# Patient Record
Sex: Male | Born: 1935 | Race: White | Hispanic: No | Smoking: Former smoker
Health system: Southern US, Community
[De-identification: ages and names within clinical notes are randomized; demographics above are authoritative.]

## PROBLEM LIST (undated history)

## (undated) DIAGNOSIS — I77810 Thoracic aortic ectasia: Secondary | ICD-10-CM

## (undated) DIAGNOSIS — I350 Nonrheumatic aortic (valve) stenosis: Secondary | ICD-10-CM

## (undated) DIAGNOSIS — B029 Zoster without complications: Secondary | ICD-10-CM

## (undated) DIAGNOSIS — E785 Hyperlipidemia, unspecified: Secondary | ICD-10-CM

## (undated) DIAGNOSIS — E119 Type 2 diabetes mellitus without complications: Secondary | ICD-10-CM

## (undated) DIAGNOSIS — G459 Transient cerebral ischemic attack, unspecified: Secondary | ICD-10-CM

## (undated) DIAGNOSIS — N419 Inflammatory disease of prostate, unspecified: Secondary | ICD-10-CM

## (undated) DIAGNOSIS — K219 Gastro-esophageal reflux disease without esophagitis: Secondary | ICD-10-CM

## (undated) DIAGNOSIS — I2510 Atherosclerotic heart disease of native coronary artery without angina pectoris: Secondary | ICD-10-CM

## (undated) DIAGNOSIS — I10 Essential (primary) hypertension: Secondary | ICD-10-CM

## (undated) DIAGNOSIS — M1990 Unspecified osteoarthritis, unspecified site: Secondary | ICD-10-CM

## (undated) DIAGNOSIS — I351 Nonrheumatic aortic (valve) insufficiency: Secondary | ICD-10-CM

## (undated) HISTORY — PX: EYE SURGERY: SHX253

## (undated) HISTORY — DX: Nonrheumatic aortic (valve) stenosis: I35.0

## (undated) HISTORY — DX: Thoracic aortic ectasia: I77.810

## (undated) HISTORY — DX: Essential (primary) hypertension: I10

## (undated) HISTORY — DX: Nonrheumatic aortic (valve) insufficiency: I35.1

## (undated) HISTORY — DX: Zoster without complications: B02.9

## (undated) HISTORY — DX: Hyperlipidemia, unspecified: E78.5

## (undated) HISTORY — PX: CATARACT EXTRACTION W/ INTRAOCULAR LENS  IMPLANT, BILATERAL: SHX1307

## (undated) HISTORY — DX: Transient cerebral ischemic attack, unspecified: G45.9

## (undated) HISTORY — DX: Inflammatory disease of prostate, unspecified: N41.9

---
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
## 1999-04-06 ENCOUNTER — Other Ambulatory Visit: Admission: RE | Admit: 1999-04-06 | Discharge: 1999-04-06 | Payer: Self-pay | Admitting: *Deleted

## 1999-04-06 ENCOUNTER — Encounter (INDEPENDENT_AMBULATORY_CARE_PROVIDER_SITE_OTHER): Payer: Self-pay | Admitting: Specialist

## 2006-10-04 ENCOUNTER — Encounter (INDEPENDENT_AMBULATORY_CARE_PROVIDER_SITE_OTHER): Payer: Self-pay | Admitting: Neurology

## 2006-10-04 ENCOUNTER — Encounter (INDEPENDENT_AMBULATORY_CARE_PROVIDER_SITE_OTHER): Payer: Self-pay | Admitting: *Deleted

## 2006-10-04 ENCOUNTER — Inpatient Hospital Stay (HOSPITAL_COMMUNITY): Admission: EM | Admit: 2006-10-04 | Discharge: 2006-10-07 | Payer: Self-pay | Admitting: Emergency Medicine

## 2009-12-10 ENCOUNTER — Emergency Department (HOSPITAL_COMMUNITY): Admission: EM | Admit: 2009-12-10 | Discharge: 2009-12-10 | Payer: Self-pay | Admitting: Emergency Medicine

## 2010-08-28 LAB — DIFFERENTIAL
Basophils Absolute: 0 10*3/uL (ref 0.0–0.1)
Eosinophils Absolute: 0 10*3/uL (ref 0.0–0.7)
Eosinophils Relative: 0 % (ref 0–5)
Lymphocytes Relative: 3 % — ABNORMAL LOW (ref 12–46)
Lymphs Abs: 0.2 10*3/uL — ABNORMAL LOW (ref 0.7–4.0)
Monocytes Absolute: 0.3 10*3/uL (ref 0.1–1.0)
Monocytes Relative: 5 % (ref 3–12)
Neutro Abs: 5.8 10*3/uL (ref 1.7–7.7)
Neutrophils Relative %: 93 % — ABNORMAL HIGH (ref 43–77)

## 2010-08-28 LAB — URINALYSIS, ROUTINE W REFLEX MICROSCOPIC: Nitrite: NEGATIVE

## 2010-08-28 LAB — CBC
Hemoglobin: 13.1 g/dL (ref 13.0–17.0)
MCH: 31.7 pg (ref 26.0–34.0)
Platelets: 127 10*3/uL — ABNORMAL LOW (ref 150–400)
RDW: 14 % (ref 11.5–15.5)
WBC: 6.2 10*3/uL (ref 4.0–10.5)

## 2010-08-28 LAB — BASIC METABOLIC PANEL
Calcium: 8.5 mg/dL (ref 8.4–10.5)
Chloride: 97 mEq/L (ref 96–112)
GFR calc Af Amer: 53 mL/min — ABNORMAL LOW (ref >60)
GFR calc non Af Amer: 44 mL/min — ABNORMAL LOW (ref >60)

## 2010-08-28 LAB — GLUCOSE, CAPILLARY: Glucose-Capillary: 126 mg/dL — ABNORMAL HIGH (ref 70–99)

## 2010-08-28 LAB — URINE MICROSCOPIC-ADD ON

## 2010-10-28 NOTE — Discharge Summary (Signed)
[name], Seth Abbott                ACCOUNT NO.:  [id number]   MEDICAL RECORD NO.:  [id number]          PATIENT TYPE:  INP   LOCATION:  3039                         FACILITY:  [facility]   PHYSICIAN:  Bevelyn Buckles. [place], M.D.DATE OF BIRTH:  Oct 24, 1927   DATE OF ADMISSION:  10/04/2006  DATE OF DISCHARGE:  10/07/2006                               DISCHARGE SUMMARY   REASON FOR ADMISSION:  TIA and hypoglycemia.   HOSPITAL COURSE:  Please see admission H&P by Dr. Orlin Hilding for complete  details.  The patient was admitted to stroke M.D. service for transient  right-sided weakness that improved.  He had a drug workup including  carotid Doppler's which showed no ICA stenosis.  A 2D echocardiogram  which showed aortic valve stenosis with an EF of 65% to 70%.  The  patient had recurrence of symptoms on October 05, 2006, in the a.m. with  right-sided weakness.  The patient was started on heparin until workup  was complete.  The patient's initial MRI of the brain on October 04, 2006,  showed no evidence of acute intracranial findings.  The MRI was  essentially unremarkable.  The patient had a repeat limited MRI once had  recurrence of symptoms that did not show any acute infarct.  Throughout  the patient's hospitalization, he had fluctuation in his sugars and his  sugar insulin regimen was adjusted.  The patient was switched from his  baby aspirin to Aggrenox for optimal stroke prevention.  The patient did  have the rest of the stroke workup completed including hemoglobin A1c of  6.3, and other laboratory cardiac workup, which was essentially normal.  The patient had no further events and was stable at the time of  discharge.   DISPOSITION:  The patient was discharged home with his family in stable  condition.   DISCHARGE MEDICATIONS:  Include:  1. Lipitor 20 mg per day.  2. Altace 5 mg p.o. daily.  3. Azopt eye drops twice a day.  4. NovoLog insulin 15 units q.a.m. and 13 units q.p.m.  5. NPH insulin  27 units q.a.m. and 24 units q.p.m.   DISCHARGE INSTRUCTIONS:  The patient was instructed to followup with PCP  within 1 to 2 weeks time and followup with Dr. Pearlean Brownie of neurology in one  months time.  The office number was given.  The patient was also told to  continue to follow his insulin closely.  He was told to call with any  questions or concerns or return to the hospital with any recurrence of  symptoms.  The patient understands and agrees he was stable on  discharge.      Bevelyn Buckles. Nash Shearer, M.D.  Electronically Signed     DRC/MEDQ  D:  10/07/2006  T:  10/07/2006  Job:  [id number]

## 2010-10-28 NOTE — H&P (Signed)
Seth Abbott, Seth Abbott                ACCOUNT NO.:  [id number]   MEDICAL RECORD NO.:  [id number]          PATIENT TYPE:  INP   LOCATION:  1843                         FACILITY:  [facility]   PHYSICIAN:  Gustavus Messing. Orlin Hilding, M.D.DATE OF BIRTH:  [birth year]   DATE OF ADMISSION:  10/04/2006  DATE OF DISCHARGE:                              HISTORY & PHYSICAL   PRIMARY CARE PHYSICIAN:  Dr. Dagoberto Ligas.   Time neurology was called:  8:22.  Time of neurology initial exam:  8:35.   CHIEF COMPLAINT:  Right-sided weakness and numbness.   HISTORY OF PRESENT ILLNESS:  Mr. Dunton is a 77 year old right-handed  white man with a history of diabetes who awoke to go to the bathroom  around 3 O'clock in the morning.  He was fine at that time, went back to  bed.  He awoke again just before 7 a.m. and while walking to the  bathroom, noted that he was weak on the right and staggery.  He fell  back on the bed, did not hit his head on anything.  His speech was  slurred, but not gibberish according to his wife.   REVIEW OF SYSTEMS:  Negative for any headache or vision problems.  No  chest pain.  No shortness of breath.  No GI or GU complaints.  No  musculoskeletal complaints.  No bruising or other hematologic  complaints.  No depression or other psychiatric complaints.   PAST MEDICAL HISTORY:  Significant for bilateral cataract surgery,  diabetes, question of borderline hypertension, hyperlipidemia.  He is on  Altace and Lipitor, that may just simply be preventative or prophylactic  given his diabetes.  He does have diabetes which is insulin-dependent,  although type 2.  He had a TIA versus stroke about 15 years ago which  was also left brain, right body.   MEDICATIONS:  1. Insulin, he takes Humulin-N 27 units twice a day and Humalog 15      twice a day.  2. Altace 5 mg daily.  3. Lipitor 20 mg daily.  4. Aspirin 81 mg a day.   ALLERGIES:  NO KNOWN DRUG ALLERGIES.   SOCIAL HISTORY:  He is married.  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  He has 2 grown children.  He had one child that was lost to [facility] at birth.   FAMILY HISTORY:  Positive for coronary artery disease in his father, who  died of a heart attack.  No stroke.   OBJECTIVE ON EXAM:  VITAL SIGNS:  Temperature is 96.5, blood pressure  143/76, heart rate 80, respirations 16, 99% sat on room air.  HEAD:  Normocephalic, atraumatic.  NECK:  Supple without bruits.  LUNGS:  Clear to auscultation.  HEART:  Regular rate and rhythm.  ABDOMEN:  Benign.  SKIN:  Dry.  Mucosa is moist.  NEUROLOGIC EXAM:  He is awake and alert.  He is oriented except that he  does not have the correct date.  He said it was November.  He eventually  corrected himself, but he stuck with November quite a bit.  He has mild  hesitance with his speech, but not enough to really qualify for an  aphasia.  Cranial nerves, pupils are equal and reactive.  He does have  iridectomies.  Visual fields are full.  The confrontation extraocular  movements are intact.  Facial sensation is normal.  Facial motor  activity is normal voluntarily.  When he is at rest and speaking, it  appears that he might have a left facial droop, but when he smiles,  puffs out his cheeks, raises eyebrows, etc, there is no facial  asymmetry.  Hearing is slightly diminished.  Tongue is midline.  On  motor exam, there is no drift in either the upper or lower extremities.  He has 5/5 strength.  Normal bulk and tone.  Normal rapid fine  movements.  No vesiculations, atrophy, or tremor.  Deep tendon reflexes  are minimal 1+ in the upper extremities, absent in the lower  extremities.  Down-going toes to plantar stimulation.  Coordination:  Finger-to-nose is normal bilaterally.  Heel-to-shin was normal on the  left.  He has mild dysmetria of heel-to-shin on the right.  Sensation  decreased slightly on the right.  Nutritional status is adequate.  His  NIH stroke scale was 3, see accompanying sheet.  Modified  Rankin scale  is 0.   LABORATORY DATA:  Labs were unremarkable.  His glucose is 92.  INR is 1.  Platelets are 192,000.  Sodium is 135.  CT is yet pending.   ASSESSMENT:  Left brain event.  Small infarct versus resolving TIA.   PLAN:  Admit to workup for stroke or TIA with MRI of the brain, MR  angiograms, carotid Dopplers, 2D echo, ST OT PT evaluation.  Will  increase his aspirin to 325 and consider Aggrenox.  He is not a TPA  candidate secondary to time elapsed.  He is not a study candidate  secondary to minimal deficit, which is improving.      Catherine A. Orlin Hilding, M.D.  Electronically Signed     CAW/MEDQ  D:  10/04/2006  T:  10/04/2006  Job:  [id number]   cc:   Alfonse Alpers. Dagoberto Ligas, M.D.

## 2011-07-26 DIAGNOSIS — E119 Type 2 diabetes mellitus without complications: Secondary | ICD-10-CM | POA: Diagnosis not present

## 2011-07-26 DIAGNOSIS — E785 Hyperlipidemia, unspecified: Secondary | ICD-10-CM | POA: Diagnosis not present

## 2011-07-26 DIAGNOSIS — I10 Essential (primary) hypertension: Secondary | ICD-10-CM | POA: Diagnosis not present

## 2011-07-26 DIAGNOSIS — E669 Obesity, unspecified: Secondary | ICD-10-CM | POA: Diagnosis not present

## 2011-07-28 DIAGNOSIS — E785 Hyperlipidemia, unspecified: Secondary | ICD-10-CM | POA: Diagnosis not present

## 2011-07-28 DIAGNOSIS — I10 Essential (primary) hypertension: Secondary | ICD-10-CM | POA: Diagnosis not present

## 2011-07-28 DIAGNOSIS — E119 Type 2 diabetes mellitus without complications: Secondary | ICD-10-CM | POA: Diagnosis not present

## 2011-08-07 DIAGNOSIS — H4011X0 Primary open-angle glaucoma, stage unspecified: Secondary | ICD-10-CM | POA: Diagnosis not present

## 2011-08-07 DIAGNOSIS — H2640 Unspecified secondary cataract: Secondary | ICD-10-CM | POA: Diagnosis not present

## 2011-08-07 DIAGNOSIS — H409 Unspecified glaucoma: Secondary | ICD-10-CM | POA: Diagnosis not present

## 2011-08-09 DIAGNOSIS — E119 Type 2 diabetes mellitus without complications: Secondary | ICD-10-CM | POA: Diagnosis not present

## 2011-08-09 DIAGNOSIS — R011 Cardiac murmur, unspecified: Secondary | ICD-10-CM | POA: Diagnosis not present

## 2011-08-09 DIAGNOSIS — I10 Essential (primary) hypertension: Secondary | ICD-10-CM | POA: Diagnosis not present

## 2011-08-09 DIAGNOSIS — E785 Hyperlipidemia, unspecified: Secondary | ICD-10-CM | POA: Diagnosis not present

## 2011-08-09 DIAGNOSIS — E669 Obesity, unspecified: Secondary | ICD-10-CM | POA: Diagnosis not present

## 2011-08-24 DIAGNOSIS — H2640 Unspecified secondary cataract: Secondary | ICD-10-CM | POA: Diagnosis not present

## 2011-08-24 DIAGNOSIS — H26499 Other secondary cataract, unspecified eye: Secondary | ICD-10-CM | POA: Diagnosis not present

## 2012-01-08 DIAGNOSIS — Z961 Presence of intraocular lens: Secondary | ICD-10-CM | POA: Diagnosis not present

## 2012-01-08 DIAGNOSIS — H35379 Puckering of macula, unspecified eye: Secondary | ICD-10-CM | POA: Diagnosis not present

## 2012-01-08 DIAGNOSIS — H4011X0 Primary open-angle glaucoma, stage unspecified: Secondary | ICD-10-CM | POA: Diagnosis not present

## 2012-01-08 DIAGNOSIS — H409 Unspecified glaucoma: Secondary | ICD-10-CM | POA: Diagnosis not present

## 2012-01-24 DIAGNOSIS — I10 Essential (primary) hypertension: Secondary | ICD-10-CM | POA: Diagnosis not present

## 2012-01-24 DIAGNOSIS — E669 Obesity, unspecified: Secondary | ICD-10-CM | POA: Diagnosis not present

## 2012-02-05 DIAGNOSIS — R011 Cardiac murmur, unspecified: Secondary | ICD-10-CM | POA: Diagnosis not present

## 2012-02-07 DIAGNOSIS — I10 Essential (primary) hypertension: Secondary | ICD-10-CM | POA: Diagnosis not present

## 2012-02-07 DIAGNOSIS — E119 Type 2 diabetes mellitus without complications: Secondary | ICD-10-CM | POA: Diagnosis not present

## 2012-02-07 DIAGNOSIS — E669 Obesity, unspecified: Secondary | ICD-10-CM | POA: Diagnosis not present

## 2012-02-07 DIAGNOSIS — R011 Cardiac murmur, unspecified: Secondary | ICD-10-CM | POA: Diagnosis not present

## 2012-02-07 DIAGNOSIS — E785 Hyperlipidemia, unspecified: Secondary | ICD-10-CM | POA: Diagnosis not present

## 2012-02-28 DIAGNOSIS — Z23 Encounter for immunization: Secondary | ICD-10-CM | POA: Diagnosis not present

## 2012-05-06 DIAGNOSIS — H409 Unspecified glaucoma: Secondary | ICD-10-CM | POA: Diagnosis not present

## 2012-05-06 DIAGNOSIS — H4011X0 Primary open-angle glaucoma, stage unspecified: Secondary | ICD-10-CM | POA: Diagnosis not present

## 2012-05-06 DIAGNOSIS — H52209 Unspecified astigmatism, unspecified eye: Secondary | ICD-10-CM | POA: Diagnosis not present

## 2012-08-12 DIAGNOSIS — E119 Type 2 diabetes mellitus without complications: Secondary | ICD-10-CM | POA: Diagnosis not present

## 2012-08-12 DIAGNOSIS — E785 Hyperlipidemia, unspecified: Secondary | ICD-10-CM | POA: Diagnosis not present

## 2012-08-19 DIAGNOSIS — E119 Type 2 diabetes mellitus without complications: Secondary | ICD-10-CM | POA: Diagnosis not present

## 2012-08-19 DIAGNOSIS — I10 Essential (primary) hypertension: Secondary | ICD-10-CM | POA: Diagnosis not present

## 2012-08-19 DIAGNOSIS — E785 Hyperlipidemia, unspecified: Secondary | ICD-10-CM | POA: Diagnosis not present

## 2012-08-19 DIAGNOSIS — E669 Obesity, unspecified: Secondary | ICD-10-CM | POA: Diagnosis not present

## 2012-11-20 DIAGNOSIS — H4011X0 Primary open-angle glaucoma, stage unspecified: Secondary | ICD-10-CM | POA: Diagnosis not present

## 2012-11-20 DIAGNOSIS — H409 Unspecified glaucoma: Secondary | ICD-10-CM | POA: Diagnosis not present

## 2012-11-20 DIAGNOSIS — H35379 Puckering of macula, unspecified eye: Secondary | ICD-10-CM | POA: Diagnosis not present

## 2013-02-07 DIAGNOSIS — R011 Cardiac murmur, unspecified: Secondary | ICD-10-CM | POA: Diagnosis not present

## 2013-02-19 DIAGNOSIS — E785 Hyperlipidemia, unspecified: Secondary | ICD-10-CM | POA: Diagnosis not present

## 2013-02-19 DIAGNOSIS — E119 Type 2 diabetes mellitus without complications: Secondary | ICD-10-CM | POA: Diagnosis not present

## 2013-02-19 DIAGNOSIS — I10 Essential (primary) hypertension: Secondary | ICD-10-CM | POA: Diagnosis not present

## 2013-02-19 DIAGNOSIS — E663 Overweight: Secondary | ICD-10-CM | POA: Diagnosis not present

## 2013-03-01 DIAGNOSIS — Z23 Encounter for immunization: Secondary | ICD-10-CM | POA: Diagnosis not present

## 2013-03-26 DIAGNOSIS — Z1331 Encounter for screening for depression: Secondary | ICD-10-CM | POA: Diagnosis not present

## 2013-03-26 DIAGNOSIS — I359 Nonrheumatic aortic valve disorder, unspecified: Secondary | ICD-10-CM | POA: Diagnosis not present

## 2013-03-26 DIAGNOSIS — I10 Essential (primary) hypertension: Secondary | ICD-10-CM | POA: Diagnosis not present

## 2013-03-26 DIAGNOSIS — E119 Type 2 diabetes mellitus without complications: Secondary | ICD-10-CM | POA: Diagnosis not present

## 2013-05-21 DIAGNOSIS — Z961 Presence of intraocular lens: Secondary | ICD-10-CM | POA: Diagnosis not present

## 2013-05-21 DIAGNOSIS — H4011X0 Primary open-angle glaucoma, stage unspecified: Secondary | ICD-10-CM | POA: Diagnosis not present

## 2013-05-21 DIAGNOSIS — H35379 Puckering of macula, unspecified eye: Secondary | ICD-10-CM | POA: Diagnosis not present

## 2013-05-21 DIAGNOSIS — H409 Unspecified glaucoma: Secondary | ICD-10-CM | POA: Diagnosis not present

## 2013-07-02 DIAGNOSIS — R0602 Shortness of breath: Secondary | ICD-10-CM | POA: Diagnosis not present

## 2013-07-12 ENCOUNTER — Encounter: Payer: Self-pay | Admitting: *Deleted

## 2013-07-12 DIAGNOSIS — Z794 Long term (current) use of insulin: Secondary | ICD-10-CM

## 2013-07-12 DIAGNOSIS — I10 Essential (primary) hypertension: Secondary | ICD-10-CM

## 2013-07-12 DIAGNOSIS — E1129 Type 2 diabetes mellitus with other diabetic kidney complication: Secondary | ICD-10-CM | POA: Insufficient documentation

## 2013-07-12 DIAGNOSIS — B029 Zoster without complications: Secondary | ICD-10-CM | POA: Insufficient documentation

## 2013-07-12 DIAGNOSIS — I152 Hypertension secondary to endocrine disorders: Secondary | ICD-10-CM | POA: Insufficient documentation

## 2013-07-12 DIAGNOSIS — N419 Inflammatory disease of prostate, unspecified: Secondary | ICD-10-CM | POA: Insufficient documentation

## 2013-07-12 DIAGNOSIS — I749 Embolism and thrombosis of unspecified artery: Secondary | ICD-10-CM

## 2013-07-12 DIAGNOSIS — R809 Proteinuria, unspecified: Secondary | ICD-10-CM

## 2013-07-12 DIAGNOSIS — G459 Transient cerebral ischemic attack, unspecified: Secondary | ICD-10-CM | POA: Insufficient documentation

## 2013-07-12 DIAGNOSIS — E1159 Type 2 diabetes mellitus with other circulatory complications: Secondary | ICD-10-CM | POA: Insufficient documentation

## 2013-07-22 DIAGNOSIS — E663 Overweight: Secondary | ICD-10-CM | POA: Diagnosis not present

## 2013-07-22 DIAGNOSIS — E785 Hyperlipidemia, unspecified: Secondary | ICD-10-CM | POA: Diagnosis not present

## 2013-07-22 DIAGNOSIS — I10 Essential (primary) hypertension: Secondary | ICD-10-CM | POA: Diagnosis not present

## 2013-07-22 DIAGNOSIS — E119 Type 2 diabetes mellitus without complications: Secondary | ICD-10-CM | POA: Diagnosis not present

## 2013-07-22 LAB — HEMOGLOBIN A1C: Hemoglobin A1C: 7.1

## 2013-09-24 DIAGNOSIS — G4700 Insomnia, unspecified: Secondary | ICD-10-CM | POA: Diagnosis not present

## 2013-09-24 DIAGNOSIS — R079 Chest pain, unspecified: Secondary | ICD-10-CM | POA: Diagnosis not present

## 2013-09-24 DIAGNOSIS — F329 Major depressive disorder, single episode, unspecified: Secondary | ICD-10-CM | POA: Diagnosis not present

## 2013-09-24 DIAGNOSIS — E119 Type 2 diabetes mellitus without complications: Secondary | ICD-10-CM | POA: Diagnosis not present

## 2013-09-24 DIAGNOSIS — I10 Essential (primary) hypertension: Secondary | ICD-10-CM | POA: Diagnosis not present

## 2013-09-24 DIAGNOSIS — F3289 Other specified depressive episodes: Secondary | ICD-10-CM | POA: Diagnosis not present

## 2013-10-01 ENCOUNTER — Encounter: Payer: Self-pay | Admitting: Cardiology

## 2013-10-01 ENCOUNTER — Ambulatory Visit (INDEPENDENT_AMBULATORY_CARE_PROVIDER_SITE_OTHER): Payer: Medicare Other | Admitting: Cardiology

## 2013-10-01 VITALS — BP 136/71 | HR 84 | Ht 67.0 in | Wt 171.8 lb

## 2013-10-01 DIAGNOSIS — I10 Essential (primary) hypertension: Secondary | ICD-10-CM

## 2013-10-01 DIAGNOSIS — I359 Nonrheumatic aortic valve disorder, unspecified: Secondary | ICD-10-CM | POA: Diagnosis not present

## 2013-10-01 DIAGNOSIS — I77810 Thoracic aortic ectasia: Secondary | ICD-10-CM | POA: Diagnosis not present

## 2013-10-01 DIAGNOSIS — I351 Nonrheumatic aortic (valve) insufficiency: Secondary | ICD-10-CM

## 2013-10-01 DIAGNOSIS — R079 Chest pain, unspecified: Secondary | ICD-10-CM | POA: Insufficient documentation

## 2013-10-01 NOTE — Progress Notes (Signed)
[address], [place], [place]  [address] Phone: [phone] Fax:  [phone]  Date:  10/01/2013   ID:  Seth Abbott, DOB 02-17-1928, MRN [id number]  PCP:  No primary provider on file.  Cardiologist:  Fransico Him, MD     History of Present Illness: Seth Abbott is a 80 y.o. male with history of type 2 DM, TIA, aortic root dilatation and HTN who presents today for evaluation of intermittent CP that is left sided and sharp and sometimes associated with SOB but no nausea.  It is nonradiating.  He says that about 2-3 weeks ago he had one spell of pain that started in his right low back and radiating into his right chest and across to the left.  He had been riding a tractor the day before.  He has not had that any more.  He says that he has a low grade pain in the left side of his chest that comes and goes and is a 4/10 at its worst.  He says that it is sharp and occurs at some point almost daily. He has some DOE as well.  It can come on with exertion or nonexertional activity.  He does a lot of work outside where he uses his upper body such as a chain saw.  He denies any LE edema, palpitations, dizziness or syncope.   Wt Readings from Last 3 Encounters:  10/01/13 171 lb 12.8 oz (77.928 kg)     Past Medical History  Diagnosis Date  . Diabetes   . TIA (transient ischemic attack)   . HTN (hypertension)   . Prostatitis   . Shingles   . Aortic regurgitation     trivial by echo 01/2013  . Hyperlipidemia   . Dilated aortic root     Current Outpatient Prescriptions  Medication Sig Dispense Refill  . aspirin 81 MG tablet Take 81 mg by mouth daily.      [name] atorvastatin (LIPITOR) 20 MG tablet Take 20 mg by mouth daily.      . dorzolamide (TRUSOPT) 2 % ophthalmic solution 1 drop 3 (three) times daily.      . insulin lispro (HUMALOG) 100 UNIT/ML cartridge Inject into the skin as directed.      . NON FORMULARY Humalin      . ramipril (ALTACE) 5 MG capsule Take 5 mg by mouth  daily.       No current facility-administered medications for this visit.    Allergies:   No Known Allergies  Social History:  The patient  reports that he has never smoked. He does not have any smokeless tobacco history on file. He reports that he does not drink alcohol or use illicit drugs.   Family History:  The patient's family history includes CAD in his father; COPD in his brother; Diabetes in his father, mother, sister, and sister; Heart attack in his father.   ROS:  Please see the history of present illness.      All other systems reviewed and negative.   PHYSICAL EXAM: VS:  BP 136/71  Pulse 84  Ht 5\' 7"  (1.702 m)  Wt 171 lb 12.8 oz (77.928 kg)  BMI 26.90 kg/m2 Well nourished, well developed, in no acute distress HEENT: normal Neck: no JVD Cardiac:  normal S1, S2; RRR; 2/6 SM at RUSB to LLSB Lungs:  clear to auscultation bilaterally, no wheezing, rhonchi or rales Abd: soft, nontender, no hepatomegaly Ext: no edema Skin: warm and dry Neuro:  CNs 2-12 intact, no focal abnormalities noted  EKG:     09/23/2013 NSR with occasional PVC's and low voltage and normal intervals  ASSESSMENT AND PLAN:  1. Chest pain with typical and atypical components in a patient with CRF including family history of CAD, HTN, DM - Stress myoview to rule out ischemia 2. Mild aortic root dilatation getting yearly echoes to follow 3. Heart murmur mild AS/AR on echo 2013 - 2D echo to assess for progression of AS  Followup with me in 1 year is studies are normal  Signed, Fransico Him, MD 10/01/2013 9:46 AM

## 2013-10-01 NOTE — Patient Instructions (Signed)
Your physician recommends that you continue on your current medications as directed. Please refer to the Current Medication list given to you today.  Your physician has requested that you have an echocardiogram. Echocardiography is a painless test that uses sound waves to create images of your heart. It provides your doctor with information about the size and shape of your heart and how well your heart's chambers and valves are working. This procedure takes approximately one hour. There are no restrictions for this procedure.  Your physician has requested that you have an exercise stress myoview. For further information please visit [email or web address]. Please follow instruction sheet, as given.  Your physician wants you to follow-up in: 12 months You will receive a reminder letter in the mail two months in advance. If you don't receive a letter, please call our office to schedule the follow-up appointment.

## 2013-10-12 ENCOUNTER — Emergency Department (HOSPITAL_COMMUNITY): Payer: Medicare Other

## 2013-10-12 ENCOUNTER — Encounter (HOSPITAL_COMMUNITY): Payer: Self-pay | Admitting: Emergency Medicine

## 2013-10-12 ENCOUNTER — Emergency Department (HOSPITAL_COMMUNITY)
Admission: EM | Admit: 2013-10-12 | Discharge: 2013-10-13 | Disposition: A | Discharge status: Home / Self Care | Payer: Medicare Other | Attending: Emergency Medicine | Admitting: Emergency Medicine

## 2013-10-12 DIAGNOSIS — N200 Calculus of kidney: Secondary | ICD-10-CM | POA: Insufficient documentation

## 2013-10-12 DIAGNOSIS — I10 Essential (primary) hypertension: Secondary | ICD-10-CM | POA: Insufficient documentation

## 2013-10-12 DIAGNOSIS — R109 Unspecified abdominal pain: Secondary | ICD-10-CM | POA: Diagnosis not present

## 2013-10-12 DIAGNOSIS — J984 Other disorders of lung: Secondary | ICD-10-CM | POA: Diagnosis not present

## 2013-10-12 DIAGNOSIS — K3189 Other diseases of stomach and duodenum: Secondary | ICD-10-CM | POA: Diagnosis not present

## 2013-10-12 DIAGNOSIS — Z79899 Other long term (current) drug therapy: Secondary | ICD-10-CM | POA: Diagnosis not present

## 2013-10-12 DIAGNOSIS — Z8673 Personal history of transient ischemic attack (TIA), and cerebral infarction without residual deficits: Secondary | ICD-10-CM | POA: Diagnosis not present

## 2013-10-12 DIAGNOSIS — Z7982 Long term (current) use of aspirin: Secondary | ICD-10-CM | POA: Diagnosis not present

## 2013-10-12 DIAGNOSIS — R1013 Epigastric pain: Secondary | ICD-10-CM

## 2013-10-12 DIAGNOSIS — E119 Type 2 diabetes mellitus without complications: Secondary | ICD-10-CM | POA: Diagnosis not present

## 2013-10-12 DIAGNOSIS — Z8619 Personal history of other infectious and parasitic diseases: Secondary | ICD-10-CM | POA: Diagnosis not present

## 2013-10-12 DIAGNOSIS — N201 Calculus of ureter: Secondary | ICD-10-CM | POA: Diagnosis not present

## 2013-10-12 DIAGNOSIS — R011 Cardiac murmur, unspecified: Secondary | ICD-10-CM | POA: Insufficient documentation

## 2013-10-12 DIAGNOSIS — Z87448 Personal history of other diseases of urinary system: Secondary | ICD-10-CM | POA: Diagnosis not present

## 2013-10-12 DIAGNOSIS — E785 Hyperlipidemia, unspecified: Secondary | ICD-10-CM | POA: Diagnosis not present

## 2013-10-12 DIAGNOSIS — R0602 Shortness of breath: Secondary | ICD-10-CM | POA: Diagnosis not present

## 2013-10-12 DIAGNOSIS — Z794 Long term (current) use of insulin: Secondary | ICD-10-CM | POA: Insufficient documentation

## 2013-10-12 LAB — URINALYSIS, ROUTINE W REFLEX MICROSCOPIC
BILIRUBIN URINE: NEGATIVE
GLUCOSE, UA: NEGATIVE mg/dL
Ketones, ur: NEGATIVE mg/dL
Leukocytes, UA: NEGATIVE
Nitrite: NEGATIVE
PROTEIN: NEGATIVE mg/dL
SPECIFIC GRAVITY, URINE: 1.015 (ref 1.005–1.030)
UROBILINOGEN UA: 1 mg/dL (ref 0.0–1.0)
pH: 6.5 (ref 5.0–8.0)

## 2013-10-12 LAB — COMPREHENSIVE METABOLIC PANEL
ALBUMIN: 4 g/dL (ref 3.5–5.2)
ALT: 16 U/L (ref 0–53)
AST: 19 U/L (ref 0–37)
Alkaline Phosphatase: 92 U/L (ref 39–117)
BILIRUBIN TOTAL: 0.6 mg/dL (ref 0.3–1.2)
BUN: 14 mg/dL (ref 6–23)
CALCIUM: 9.5 mg/dL (ref 8.4–10.5)
CO2: 25 mEq/L (ref 19–32)
CREATININE: 1 mg/dL (ref 0.50–1.35)
Chloride: 101 mEq/L (ref 96–112)
GFR calc non Af Amer: 67 mL/min — ABNORMAL LOW (ref >90)
GFR, EST AFRICAN AMERICAN: 78 mL/min — AB (ref >90)
GLUCOSE: 125 mg/dL — AB (ref 70–99)
POTASSIUM: 4.4 meq/L (ref 3.7–5.3)
Sodium: 140 mEq/L (ref 137–147)
Total Protein: 7 g/dL (ref 6.0–8.3)

## 2013-10-12 LAB — CBC WITH DIFFERENTIAL/PLATELET
BASOS ABS: 0 10*3/uL (ref 0.0–0.1)
Basophils Relative: 0 % (ref 0–1)
EOS ABS: 0.1 10*3/uL (ref 0.0–0.7)
Eosinophils Relative: 2 % (ref 0–5)
HEMATOCRIT: 43.9 % (ref 39.0–52.0)
Hemoglobin: 15.1 g/dL (ref 13.0–17.0)
LYMPHS PCT: 12 % (ref 12–46)
Lymphs Abs: 0.9 10*3/uL (ref 0.7–4.0)
MCH: 31.8 pg (ref 26.0–34.0)
MCHC: 34.4 g/dL (ref 30.0–36.0)
MCV: 92.4 fL (ref 78.0–100.0)
MONO ABS: 0.9 10*3/uL (ref 0.1–1.0)
MONOS PCT: 12 % (ref 3–12)
NEUTROS ABS: 5.4 10*3/uL (ref 1.7–7.7)
Neutrophils Relative %: 74 % (ref 43–77)
PLATELETS: 189 10*3/uL (ref 150–400)
RBC: 4.75 MIL/uL (ref 4.22–5.81)
RDW: 13.1 % (ref 11.5–15.5)
WBC: 7.3 10*3/uL (ref 4.0–10.5)

## 2013-10-12 LAB — URINE MICROSCOPIC-ADD ON

## 2013-10-12 LAB — TROPONIN I

## 2013-10-12 MED ORDER — MORPHINE SULFATE 4 MG/ML IJ SOLN
4.0000 mg | Freq: Once | INTRAMUSCULAR | Status: AC
  Administered 2013-10-12: 4 mg via INTRAVENOUS
  Filled 2013-10-12: qty 1

## 2013-10-12 MED ORDER — ONDANSETRON HCL 4 MG/2ML IJ SOLN
4.0000 mg | Freq: Once | INTRAMUSCULAR | Status: AC
Start: 2013-10-12 — End: 2013-10-12
  Administered 2013-10-12: 4 mg via INTRAVENOUS
  Filled 2013-10-12: qty 2

## 2013-10-12 MED ORDER — IOHEXOL 350 MG/ML SOLN
100.0000 mL | Freq: Once | INTRAVENOUS | Status: AC | PRN
  Administered 2013-10-12: 100 mL via INTRAVENOUS

## 2013-10-12 NOTE — ED Notes (Signed)
EKG given to Dr. Ples Specter

## 2013-10-12 NOTE — ED Notes (Signed)
Updated patient and family that patient will have X-ray done at the bedside, and has CT angio ordered also.

## 2013-10-12 NOTE — ED Notes (Signed)
Portable  Xray at the bedside.

## 2013-10-12 NOTE — ED Notes (Signed)
Pt transported to CT ?

## 2013-10-12 NOTE — ED Notes (Addendum)
C/o R abd pain. Also R flank pain. abd pain onset yesterday. Back pain onset today. (denies: CP, nvd, sob, fever, bleeding, urinary sx or other sx), constant since 1500, no aggravating or aleviating factors, has tried [place] with some relief. H/o DM and cardiac issues. recent card appt on 4/22 for overdue visit. Took tagamet this am. cbg at home tonight was 114.

## 2013-10-12 NOTE — ED Provider Notes (Signed)
CSN: [id number]     Arrival date & time 10/12/13  1908 History   First MD Initiated Contact with Patient 10/12/13 2136     Chief Complaint  Patient presents with  . Abdominal Pain  . Back Pain     (Consider location/radiation/quality/duration/timing/severity/associated sxs/prior Treatment) HPI  Seth Abbott is a 80 y.o. male complaining of intermittent abdominal pain onset 3 days ago associated with severe left low back pain onset today. Back pain was maximal at onset now it is about 6/10. Patient denies chest pain, syncope, shortness of breath, decreased by mouth intake, change in bowel or bladder habits, cough fever. Patient has never been a smoker, follows with cardiologist [name], has stress test ordered for next week.   Past Medical History  Diagnosis Date  . Diabetes   . TIA (transient ischemic attack)   . HTN (hypertension)   . Prostatitis   . Shingles   . Aortic regurgitation     trivial by echo 01/2013  . Hyperlipidemia   . Dilated aortic root    Past Surgical History  Procedure Laterality Date  . Cataract extraction     Family History  Problem Relation Age of Onset  . Diabetes Mother   . Heart attack Father   . Diabetes Father   . CAD Father   . Diabetes Sister   . COPD Brother   . Diabetes Sister    History  Substance Use Topics  . Smoking status: Never Smoker   . Smokeless tobacco: Not on file  . Alcohol Use: No    Review of Systems  10 systems reviewed and found to be negative, except as noted in the HPI.  Allergies  Review of patient's allergies indicates no known allergies.  Home Medications   Prior to Admission medications   Medication Sig Start Date End Date Taking? Authorizing Provider  aspirin 81 MG tablet Take 81 mg by mouth daily.    Historical Provider, MD  atorvastatin (LIPITOR) 20 MG tablet Take 20 mg by mouth daily.    Historical Provider, MD  dorzolamide (TRUSOPT) 2 % ophthalmic solution 1 drop 3 (three) times daily.     Historical Provider, MD  insulin lispro (HUMALOG) 100 UNIT/ML cartridge Inject into the skin as directed.    Historical Provider, MD  NON FORMULARY Humalin    Historical Provider, MD  ramipril (ALTACE) 5 MG capsule Take 5 mg by mouth daily.    Historical Provider, MD   BP 149/70  Pulse 73  Temp(Src) 97.6 F (36.4 C) (Oral)  Resp 18  Ht 5\' 7"  (1.702 m)  Wt 171 lb (77.565 kg)  BMI 26.78 kg/m2  SpO2 99% Physical Exam  Nursing note and vitals reviewed. Constitutional: He is oriented to person, place, and time. He appears well-developed and well-nourished. No distress.  Appears uncomfortable.  HENT:  Head: Normocephalic.  Mouth/Throat: Oropharynx is clear and moist.  Eyes: Conjunctivae and EOM are normal. Pupils are equal, round, and reactive to light.  Cardiovascular: Normal rate, regular rhythm and intact distal pulses.   Murmur heard. Systolic blowing murmur, 2/5  Pulmonary/Chest: Effort normal and breath sounds normal. No stridor. No respiratory distress. He has no wheezes. He has no rales. He exhibits no tenderness.  Abdominal: Soft. Bowel sounds are normal. He exhibits no distension and no mass. There is no tenderness. There is no rebound and no guarding.  No tenderness to palpation in any quadrant  Musculoskeletal: Normal range of motion.  No tenderness to percussion of  lumbar spinal processes, and no paraspinal spasm or tenderness to palpation.  Strength is 5 out of 5 in the bilateral lower extremities.  Neurological: He is alert and oriented to person, place, and time.  Psychiatric: He has a normal mood and affect.    ED Course  Procedures (including critical care time) Labs Review Labs Reviewed  COMPREHENSIVE METABOLIC PANEL - Abnormal; Notable for the following:    Glucose, Bld 125 (*)    GFR calc non Af Amer 67 (*)    GFR calc Af Amer 78 (*)    All other components within normal limits  URINALYSIS, ROUTINE W REFLEX MICROSCOPIC - Abnormal; Notable for the following:     Hgb urine dipstick LARGE (*)    All other components within normal limits  URINE MICROSCOPIC-ADD ON - Abnormal; Notable for the following:    Bacteria, UA FEW (*)    All other components within normal limits  CBC WITH DIFFERENTIAL  TROPONIN I    Imaging Review Dg Chest Port 1 View  10/12/2013   CLINICAL DATA:  Left-sided low back pain, shortness of breath  EXAM: PORTABLE CHEST - 1 VIEW  COMPARISON:  None.  FINDINGS: The heart size and mediastinal contours are within normal limits. Both lungs are clear. The visualized skeletal structures are unremarkable.  IMPRESSION: No active disease.   Electronically Signed   By: Kathreen Devoid   On: 10/12/2013 22:37   Ct Angio Chest Aorta W/cm &/or Wo/cm  10/12/2013   CLINICAL DATA:  Right abdominal pain, back pain.  EXAM: CT ANGIOGRAPHY CHEST  CT angiography ABDOMEN AND PELVIS  TECHNIQUE: Multidetector CT angiography imaging of the chest was performed using the standard protocol during bolus administration of intravenous contrast. Multiplanar CT image reconstructions and MIPs were obtained to evaluate the vascular anatomy. Multidetector CT angiography imaging of the abdomen and pelvis was performed using the standard protocol during bolus administration of intravenous contrast.Multiplanar CT image reconstructions and MIPs were obtained to evaluate the vascular anatomy.  CONTRAST:  113mL OMNIPAQUE IOHEXOL 350 MG/ML SOLN  COMPARISON:  None.  FINDINGS: CTA CHEST FINDINGS  Three vessel aortic arch with mild calcific atherosclerosis, main branch vessels are widely patent. Thoracic aorta is normal in course and caliber with very mild calcific atherosclerosis. No dissection, aneurysm, suspicious intimal irregularity, periaortic fluid collections or contrast extravasation.  Heart and pericardium are unremarkable. Mild coronary artery calcifications.  Mild biapical pleural scarring. No pleural effusions, focal consolidations. 2 mm right upper lobe granuloma with scarring.  Tracheobronchial tree is patent and midline.  Thoracic esophagus is unremarkable. No lymphadenopathy by CT size criteria. Soft tissues are nonsuspicious. Bridging ventral syndesmotic fights. Mild degenerative change of thoracic spine.  CTA ABDOMEN and PELVIS FINDINGS  Aortoiliac vessels are normal in course and caliber. Mild calcific atherosclerosis. Ostia of the superior mesenteric artery, celiac trunk in inferior mesenteric artery are widely patent. Patent ostia of the renal arteries.No dissection, aneurysm, suspicious intimal irregularity, periaortic fluid collections or contrast extravasation.  The liver, pancreas, adrenal glands and gallbladder are unremarkable. Mosaic profusion of the spleen likely reflective of bolus timing.  Mild gastric fundal wall thickening may reflect underdistention, stomach is otherwise unremarkable. Small and large bowel are normal in course and caliber without inflammatory changes though sensitivity may be decreased by lack of enteric contrast. Mild-to-moderate amount of retained large bowel stool. A few colonic diverticula are noted. Normal air-filled appendix. No intraperitoneal free fluid nor free air.  Mild left hydroureteronephrosis to the level of the mid  ureter where a 6 mm calculus is seen. No residual nephrolithiasis. Slight delayed nephrogram on the left. The kidneys are otherwise unremarkable. Urinary bladder is well distended, harboring no intravesicular calculi. Prostate is 5 cm in transaxial dimension, mildly invading the bladder trigone.  Small fat containing umbilical hernia. Degenerative change of lumbar spine resulting in moderate canal stenosis at L3-4, mild-to-moderate L4-5. Scattered Schmorl's nodes. Moderate L2-3 through L5-S1 neural foraminal narrowing.  Review of the MIP images confirms the above findings.  IMPRESSION: CTA Chest: No acute vascular injury. No acute cardiopulmonary process.  CTA abdomen and pelvis:  No acute vascular injury.  Mild left  hydroureteronephrosis to the mid ureter where a 6 mm ureteral calculus is seen. Mildly delayed left nephrogram suggests dysfunction. No residual nephrolithiasis.   Electronically Signed   By: Elon Alas   On: 10/12/2013 23:54   Ct Angio Abd/pel W/ And/or W/o  10/12/2013   CLINICAL DATA:  Right abdominal pain, back pain.  EXAM: CT ANGIOGRAPHY CHEST  CT angiography ABDOMEN AND PELVIS  TECHNIQUE: Multidetector CT angiography imaging of the chest was performed using the standard protocol during bolus administration of intravenous contrast. Multiplanar CT image reconstructions and MIPs were obtained to evaluate the vascular anatomy. Multidetector CT angiography imaging of the abdomen and pelvis was performed using the standard protocol during bolus administration of intravenous contrast.Multiplanar CT image reconstructions and MIPs were obtained to evaluate the vascular anatomy.  CONTRAST:  115mL OMNIPAQUE IOHEXOL 350 MG/ML SOLN  COMPARISON:  None.  FINDINGS: CTA CHEST FINDINGS  Three vessel aortic arch with mild calcific atherosclerosis, main branch vessels are widely patent. Thoracic aorta is normal in course and caliber with very mild calcific atherosclerosis. No dissection, aneurysm, suspicious intimal irregularity, periaortic fluid collections or contrast extravasation.  Heart and pericardium are unremarkable. Mild coronary artery calcifications.  Mild biapical pleural scarring. No pleural effusions, focal consolidations. 2 mm right upper lobe granuloma with scarring. Tracheobronchial tree is patent and midline.  Thoracic esophagus is unremarkable. No lymphadenopathy by CT size criteria. Soft tissues are nonsuspicious. Bridging ventral syndesmotic fights. Mild degenerative change of thoracic spine.  CTA ABDOMEN and PELVIS FINDINGS  Aortoiliac vessels are normal in course and caliber. Mild calcific atherosclerosis. Ostia of the superior mesenteric artery, celiac trunk in inferior mesenteric artery are widely  patent. Patent ostia of the renal arteries.No dissection, aneurysm, suspicious intimal irregularity, periaortic fluid collections or contrast extravasation.  The liver, pancreas, adrenal glands and gallbladder are unremarkable. Mosaic profusion of the spleen likely reflective of bolus timing.  Mild gastric fundal wall thickening may reflect underdistention, stomach is otherwise unremarkable. Small and large bowel are normal in course and caliber without inflammatory changes though sensitivity may be decreased by lack of enteric contrast. Mild-to-moderate amount of retained large bowel stool. A few colonic diverticula are noted. Normal air-filled appendix. No intraperitoneal free fluid nor free air.  Mild left hydroureteronephrosis to the level of the mid ureter where a 6 mm calculus is seen. No residual nephrolithiasis. Slight delayed nephrogram on the left. The kidneys are otherwise unremarkable. Urinary bladder is well distended, harboring no intravesicular calculi. Prostate is 5 cm in transaxial dimension, mildly invading the bladder trigone.  Small fat containing umbilical hernia. Degenerative change of lumbar spine resulting in moderate canal stenosis at L3-4, mild-to-moderate L4-5. Scattered Schmorl's nodes. Moderate L2-3 through L5-S1 neural foraminal narrowing.  Review of the MIP images confirms the above findings.  IMPRESSION: CTA Chest: No acute vascular injury. No acute cardiopulmonary process.  CTA abdomen and  pelvis:  No acute vascular injury.  Mild left hydroureteronephrosis to the mid ureter where a 6 mm ureteral calculus is seen. Mildly delayed left nephrogram suggests dysfunction. No residual nephrolithiasis.   Electronically Signed   By: Elon Alas   On: 10/12/2013 23:54     EKG Interpretation   Date/Time:  Sunday Oct 12 2013 22:35:22 EDT Ventricular Rate:  77 PR Interval:  136 QRS Duration: 86 QT Interval:  381 QTC Calculation: 431 R Axis:   -49 Text Interpretation:  Sinus  rhythm Baseline wander Artifact When compared  with ECG of 10/04/2006 No significant change was found Confirmed by  [facility]  MD, KATHLEEN [id number]) on 10/12/2013 11:37:48 PM      MDM   Final diagnoses:  Kidney stone on left side    Filed Vitals:   10/12/13 2245 10/12/13 2300 10/12/13 2301 10/13/13 0015  BP: 150/71 161/75 161/75 149/70  Pulse: 75 79 75 73  Temp:      TempSrc:      Resp: 20 20 18 18   Height:      Weight:      SpO2: 98% 97% 98% 99%    Medications  morphine 4 MG/ML injection 4 mg (not administered)  morphine 4 MG/ML injection 4 mg (4 mg Intravenous Given 10/12/13 2314)  ondansetron (ZOFRAN) injection 4 mg (4 mg Intravenous Given 10/12/13 2314)  iohexol (OMNIPAQUE) 350 MG/ML injection 100 mL (100 mLs Intravenous Contrast Given 10/12/13 2324)    Seth Abbott is a 80 y.o. male presenting with dyspepsia and severe left low back pain. Concern for AAA and dissection. EKG is nonischemic, troponin is negative, blood work unremarkable and UA with a large amount of hemoglobin. No signs of infection. CTA chest and abdomen reveal a normal aorta and a 6 mm mid left ureteral stone. Patient has no prior history of kidney stones. Discussed return precautions with patient and his sons. Advised close followup with urology.  Evaluation does not show pathology that would require ongoing emergent intervention or inpatient treatment. Pt is hemodynamically stable and mentating appropriately. Discussed findings and plan with patient/guardian, who agrees with care plan. All questions answered. Return precautions discussed and outpatient follow up given.   New Prescriptions   OXYCODONE-ACETAMINOPHEN (PERCOCET/ROXICET) 5-325 MG PER TABLET    1 to 2 tabs PO q6hrs  PRN for pain   TAMSULOSIN (FLOMAX) 0.4 MG CAPS CAPSULE    Take 1 capsule (0.4 mg total) by mouth daily after breakfast.    Note: Portions of this report may have been transcribed using voice recognition software. Every effort was made to  ensure accuracy; however, inadvertent computerized transcription errors may be present     Monico Blitz, PA-C 10/13/13 0034

## 2013-10-12 NOTE — ED Notes (Signed)
Reported patient's pain to [place], [place]

## 2013-10-13 MED ORDER — TAMSULOSIN HCL 0.4 MG PO CAPS: 0.4000 mg | ORAL_CAPSULE | Freq: Every day | ORAL | Status: DC

## 2013-10-13 MED ORDER — MORPHINE SULFATE 4 MG/ML IJ SOLN
4.0000 mg | Freq: Once | INTRAMUSCULAR | Status: AC
  Administered 2013-10-13: 4 mg via INTRAVENOUS
  Filled 2013-10-13: qty 1

## 2013-10-13 MED ORDER — OXYCODONE-ACETAMINOPHEN 5-325 MG PO TABS: ORAL_TABLET | ORAL | Status: DC

## 2013-10-13 NOTE — Discharge Instructions (Signed)
Push fluids: take small frequent sips of water or Gatorade, do not drink any soda, juice or caffeinated beverages.  Take percocet for breakthrough pain, do not drink alcohol, drive, care for children or do other critical tasks while taking percocet.  Strain all urine and retain any stone for analysis at your urologist.   Return to the ED if you develop fever, have vomiting that is not controlled with the medication or if pain becomes too severe.  Kidney Stones Kidney stones (urolithiasis) are deposits that form inside your kidneys. The intense pain is caused by the stone moving through the urinary tract. When the stone moves, the ureter goes into spasm around the stone. The stone is usually passed in the urine.  CAUSES   A disorder that makes certain neck glands produce too much parathyroid hormone (primary hyperparathyroidism).  A buildup of uric acid crystals, similar to gout in your joints.  Narrowing (stricture) of the ureter.  A kidney obstruction present at birth (congenital obstruction).  Previous surgery on the kidney or ureters.  Numerous kidney infections. SYMPTOMS   Feeling sick to your stomach (nauseous).  Throwing up (vomiting).  Blood in the urine (hematuria).  Pain that usually spreads (radiates) to the groin.  Frequency or urgency of urination. DIAGNOSIS   Taking a history and physical exam.  Blood or urine tests.  CT scan.  Occasionally, an examination of the inside of the urinary bladder (cystoscopy) is performed. TREATMENT   Observation.  Increasing your fluid intake.  Extracorporeal shock wave lithotripsy This is a noninvasive procedure that uses shock waves to break up kidney stones.  Surgery may be needed if you have severe pain or persistent obstruction. There are various surgical procedures. Most of the procedures are performed with the use of small instruments. Only small incisions are needed to accommodate these instruments, so recovery  time is minimized. The size, location, and chemical composition are all important variables that will determine the proper choice of action for you. Talk to your health care provider to better understand your situation so that you will minimize the risk of injury to yourself and your kidney.  HOME CARE INSTRUCTIONS   Drink enough water and fluids to keep your urine clear or pale yellow. This will help you to pass the stone or stone fragments.  Strain all urine through the provided strainer. Keep all particulate matter and stones for your health care provider to see. The stone causing the pain may be as small as a grain of salt. It is very important to use the strainer each and every time you pass your urine. The collection of your stone will allow your health care provider to analyze it and verify that a stone has actually passed. The stone analysis will often identify what you can do to reduce the incidence of recurrences.  Only take over-the-counter or prescription medicines for pain, discomfort, or fever as directed by your health care provider.  Make a follow-up appointment with your health care provider as directed.  Get follow-up X-rays if required. The absence of pain does not always mean that the stone has passed. It may have only stopped moving. If the urine remains completely obstructed, it can cause loss of kidney function or even complete destruction of the kidney. It is your responsibility to make sure X-rays and follow-ups are completed. Ultrasounds of the kidney can show blockages and the status of the kidney. Ultrasounds are not associated with any radiation and can be performed easily in a  matter of minutes. SEEK MEDICAL CARE IF:  You experience pain that is progressive and unresponsive to any pain medicine you have been prescribed. SEEK IMMEDIATE MEDICAL CARE IF:   Pain cannot be controlled with the prescribed medicine.  You have a fever or shaking chills.  The severity or  intensity of pain increases over 18 hours and is not relieved by pain medicine.  You develop a new onset of abdominal pain.  You feel faint or pass out.  You are unable to urinate. MAKE SURE YOU:   Understand these instructions.  Will watch your condition.  Will get help right away if you are not doing well or get worse. Document Released: 05/29/2005 Document Revised: 01/29/2013 Document Reviewed: 10/30/2012 [facility] Patient Information 2014 [place].

## 2013-10-13 NOTE — ED Notes (Signed)
Strainer for patient on discharge.

## 2013-10-15 NOTE — ED Provider Notes (Signed)
Medical screening examination/treatment/procedure(s) were performed by non-physician practitioner and as supervising physician I was immediately available for consultation/collaboration.   EKG Interpretation   Date/Time:  Sunday Oct 12 2013 22:35:22 EDT Ventricular Rate:  77 PR Interval:  136 QRS Duration: 86 QT Interval:  381 QTC Calculation: 431 R Axis:   -49 Text Interpretation:  Sinus rhythm Baseline wander Artifact When compared  with ECG of 10/04/2006 No significant change was found Confirmed by  [facility]  MD, Shailee Foots ([id number]) on 10/12/2013 11:37:48 PM        Alfonzo Feller, DO 10/15/13 0710

## 2013-10-20 ENCOUNTER — Encounter (HOSPITAL_COMMUNITY): Payer: Medicare Other

## 2013-10-20 ENCOUNTER — Other Ambulatory Visit (HOSPITAL_COMMUNITY): Payer: Medicare Other

## 2013-10-27 DIAGNOSIS — N201 Calculus of ureter: Secondary | ICD-10-CM | POA: Diagnosis not present

## 2013-11-11 ENCOUNTER — Other Ambulatory Visit: Payer: Self-pay | Admitting: Urology

## 2013-11-11 DIAGNOSIS — N201 Calculus of ureter: Secondary | ICD-10-CM | POA: Diagnosis not present

## 2013-11-12 ENCOUNTER — Encounter (HOSPITAL_COMMUNITY): Payer: Self-pay | Admitting: Pharmacy Technician

## 2013-11-13 NOTE — Progress Notes (Signed)
Spoke to patient via phone,history obtained,updated.  Bring blue folder,insurance cards,picture ID,designated driver and living will,POA, if desires (to be placed on chart). Reinforced no aspirin(instructions to hold aspirin per your doctor),(do not take Aspirin beginning   Friday morning until after ESWL ) ibuprofen products 72 hours prior to procedure. No vitamins or herbal medicines 7 days prior to procedure.   Follow laxative instructions provided by urologist (office) and in blue folder. Pt states he is diabetic and checks his blood sugar each evening for his insulin dose. Pt was told he might want to take half the dose of his evening insulin  but patient was hesitant to do this. He was encouraged to check his CBG as usual in the evening before the ESWL and eat light but he could eat up until 12 MN and he is  NOT to take AM dose of insulin due to be NPO past MN. He can take his Lipitor and Altace with a sip of water AM of procedure Pt verbalized understanding  Wear easy on/off clothing  Pt able to answer all questions about health history and medications and wrote all information down during the phone interview . Pt states he does not have the blue folder but expects it to be mailed to son's home. I offered to call son's phone number to give him any information but patient states he will relay information of time of arrival ( Monday 11/17/13 at 0800 to [facility] entrance . Turn left down glass hallway and turn left at the end of the hall and look for [place]) for procedure to son and would call Short Stay at [phone] if they have any questions.  Pt verbalized understanding of all information

## 2013-11-16 NOTE — H&P (Signed)
Reason For Visit f/u for distal ureteral stone   History of Present Illness 78M presents in follow-up for left distal ureteral stone. He was placed on medical expulsion therapy. He initially developed symptoms in early May and was seen in the ED May 15th.    Interval: The patient has stopped taking Flomax because of arthralgia shoulder and neck pain. Those symptoms resolved. He denies any flank pain or changes in his voiding pattern. He denies any gross hematuria or fevers. He does not think these passes stone   Past Medical History Problems  1. History of transient cerebral ischemia (V12.54) 2. History of type 2 diabetes mellitus (V12.29)  Surgical History Problems  1. History of No Surgical Problems  Current Meds 1. Aspirin 81 MG Oral Tablet;  Therapy: (Recorded:18May2015) to Recorded 2. Dorzolamide HCl - 2 % Ophthalmic Solution;  Therapy: (Recorded:18May2015) to Recorded 3. HumaLOG 100 UNIT/ML Subcutaneous Solution;  Therapy: (Recorded:18May2015) to Recorded 4. HumuLIN N 100 UNIT/ML Subcutaneous Suspension;  Therapy: (Recorded:18May2015) to Recorded 5. Lipitor 20 MG Oral Tablet;  Therapy: (Recorded:18May2015) to Recorded 6. OxyCODONE HCl - 5 MG Oral Capsule; TAKE 1 CAPSULE EVERY 4  TO 6 HOURS AS  NEEDED FOR BREAKTHROUGH PAIN;  Therapy: 95MWU1324 to (Evaluate:28May2015); Last Rx:18May2015 Ordered 7. Ramipril 5 MG Oral Capsule;  Therapy: (Recorded:18May2015) to Recorded 8. Tamsulosin HCl - 0.4 MG Oral Capsule; TAKE 1 CAPSULE Daily;  Therapy: 40NUU7253 to (Evaluate:17Jul2015); Last Rx:18May2015 Ordered  Family History Problems  1. Family history of diabetes mellitus (V18.0) : Mother 2. Family history of myocardial infarction (V17.3) : Father  Social History Problems  1. Denied: History of Alcohol use 2. Caffeine use (V49.89) 3. Never a smoker 4. Widower  Review of Systems No changes in pts bowel habits, neurological changes, or progressive lower urinary tract  symptoms.    Vitals Vital Signs [Data Includes: Last 1 Day]  Recorded: 02Jun2015 09:40AM  Height: 5 ft 7 in Weight: 171 lb  BMI Calculated: 26.78 BSA Calculated: 1.89 Blood Pressure: 107 / 47 Temperature: 97.1 F Heart Rate: 115  Physical Exam Mild left lower quadrant tenderness to palpation, no CVA tenderness   Results/Data Urine [Data Includes: Last 1 Day]   [id number]  COLOR YELLOW   APPEARANCE CLEAR   SPECIFIC GRAVITY 1.025   pH 5.5   GLUCOSE 500 mg/dL  BILIRUBIN NEG   KETONE NEG mg/dL  BLOOD TRACE   PROTEIN TRACE mg/dL  UROBILINOGEN 0.2 mg/dL  NITRITE NEG   LEUKOCYTE ESTERASE NEG   SQUAMOUS EPITHELIAL/HPF RARE   WBC 0-2 WBC/hpf  RBC 0-2 RBC/hpf  BACTERIA NONE SEEN   CRYSTALS NONE SEEN   CASTS NONE SEEN    KUB performed in the office today: The renal shadows are visible bilaterally. There are no identifiable stones in either renal pelvis. There are no stones along the expected trajectory of the ureters bilaterally until down in the pelvis there is a opacification of left lower ureter which was present on the previous KUB. This is likely to represent the patient's stone and shows no interval change in location. The gas pattern is unremarkable. Bony structures are without abnormalities.   Assessment Persistent left distal ureteral stone, pain well-controlled no evidence of infection.   Plan  Health Maintenance  1. UA With REFLEX; [Do Not Release]; Status:Complete;   Done: [id number] 09:38AM  Follow-up Schedule Surgery Office Follow-up Status: Hold For - Appointment Requested for: [phone] Ordered;  For: Left ureteral calculus; Ordered By: Louis Meckel Performed:  Due: 84ZYS0630 Marked Important URINE CULTURE;  Status:Hold For - Specimen/Data Collection,Appointment; Requested ZMO:[id number];  Perform:[facility]; KPT:46FKC1275; Marked Important;Ordered; Today;  TZG:YFVC ureteral calculus; Ordered BS:WHQPRFF, [name];   Discussion/Summary I went over the  treatment options for the patient. It has been almost a month since he started down in the past. His first symptoms were beginning of May. We discussed continued medical expulsion therapy, although he did not tolerate the Flomax. We also discovered shockwave lithotripsy and ureteroscopy. Given the options the patient would like to proceed with shockwave lithotripsy. I told him the likelihood of having a successful shock without needing additional procedures was approximately 70%. She understands that he may need additional treatments for obstructing fragments. The patient is on a 1 mg aspirin which she will need stop. We'll try to get this scheduled sometime early next week.

## 2013-11-17 ENCOUNTER — Encounter (HOSPITAL_COMMUNITY): Payer: Self-pay | Admitting: General Practice

## 2013-11-17 ENCOUNTER — Ambulatory Visit (HOSPITAL_COMMUNITY): Payer: Medicare Other

## 2013-11-17 ENCOUNTER — Encounter (HOSPITAL_COMMUNITY)
Admission: RE | Disposition: A | Discharge status: Home / Self Care | Payer: Self-pay | Source: Ambulatory Visit | Attending: Urology

## 2013-11-17 ENCOUNTER — Ambulatory Visit (HOSPITAL_COMMUNITY)
Admission: RE | Admit: 2013-11-17 | Discharge: 2013-11-17 | Disposition: A | Discharge status: Home / Self Care | Payer: Medicare Other | Source: Ambulatory Visit | Attending: Urology | Admitting: Urology

## 2013-11-17 DIAGNOSIS — Z79899 Other long term (current) drug therapy: Secondary | ICD-10-CM | POA: Diagnosis not present

## 2013-11-17 DIAGNOSIS — Z7982 Long term (current) use of aspirin: Secondary | ICD-10-CM | POA: Insufficient documentation

## 2013-11-17 DIAGNOSIS — Z794 Long term (current) use of insulin: Secondary | ICD-10-CM | POA: Diagnosis not present

## 2013-11-17 DIAGNOSIS — E119 Type 2 diabetes mellitus without complications: Secondary | ICD-10-CM | POA: Diagnosis not present

## 2013-11-17 DIAGNOSIS — N201 Calculus of ureter: Secondary | ICD-10-CM | POA: Insufficient documentation

## 2013-11-17 DIAGNOSIS — Z8673 Personal history of transient ischemic attack (TIA), and cerebral infarction without residual deficits: Secondary | ICD-10-CM | POA: Diagnosis not present

## 2013-11-17 DIAGNOSIS — Z01818 Encounter for other preprocedural examination: Secondary | ICD-10-CM | POA: Diagnosis not present

## 2013-11-17 DIAGNOSIS — N200 Calculus of kidney: Secondary | ICD-10-CM

## 2013-11-17 LAB — GLUCOSE, CAPILLARY: Glucose-Capillary: 165 mg/dL — ABNORMAL HIGH (ref 70–99)

## 2013-11-17 SURGERY — LITHOTRIPSY, ESWL
Anesthesia: LOCAL | Laterality: Left

## 2013-11-17 MED ORDER — DIPHENHYDRAMINE HCL 25 MG PO CAPS
25.0000 mg | ORAL_CAPSULE | ORAL | Status: AC
  Administered 2013-11-17: 25 mg via ORAL
  Filled 2013-11-17: qty 1

## 2013-11-17 MED ORDER — CIPROFLOXACIN HCL 500 MG PO TABS
500.0000 mg | ORAL_TABLET | ORAL | Status: AC
  Administered 2013-11-17: 500 mg via ORAL
  Filled 2013-11-17: qty 1

## 2013-11-17 MED ORDER — DIAZEPAM 5 MG PO TABS
10.0000 mg | ORAL_TABLET | ORAL | Status: AC
  Administered 2013-11-17: 10 mg via ORAL
  Filled 2013-11-17: qty 2

## 2013-11-17 MED ORDER — SODIUM CHLORIDE 0.9 % IV SOLN
INTRAVENOUS | Status: DC
  Administered 2013-11-17: 08:00:00 via INTRAVENOUS

## 2013-11-17 NOTE — Op Note (Signed)
See [facility] OP note scanned into chart. 

## 2013-11-17 NOTE — Interval H&P Note (Signed)
History and Physical Interval Note:  11/17/2013 10:13 AM  Seth Abbott  has presented today for surgery, with the diagnosis of LEFT URETEERAL STONE  The various methods of treatment have been discussed with the patient and family. After consideration of risks, benefits and other options for treatment, the patient has consented to  Procedure(s): LEFT EXTRACORPOREAL SHOCK WAVE LITHOTRIPSY (ESWL) (Left) as a surgical intervention .  The patient's history has been reviewed, patient examined, no change in status, stable for surgery.  I have reviewed the patient's chart and labs.  Questions were answered to the patient's satisfaction.     Ardis Hughs

## 2013-11-17 NOTE — Discharge Instructions (Signed)
See [facility] discharge instructions in chart.  

## 2013-11-26 DIAGNOSIS — H409 Unspecified glaucoma: Secondary | ICD-10-CM | POA: Diagnosis not present

## 2013-11-26 DIAGNOSIS — H4011X0 Primary open-angle glaucoma, stage unspecified: Secondary | ICD-10-CM | POA: Diagnosis not present

## 2013-11-26 DIAGNOSIS — Z961 Presence of intraocular lens: Secondary | ICD-10-CM | POA: Diagnosis not present

## 2013-11-28 DIAGNOSIS — N200 Calculus of kidney: Secondary | ICD-10-CM | POA: Diagnosis not present

## 2013-11-28 DIAGNOSIS — R42 Dizziness and giddiness: Secondary | ICD-10-CM | POA: Diagnosis not present

## 2013-11-28 DIAGNOSIS — I959 Hypotension, unspecified: Secondary | ICD-10-CM | POA: Diagnosis not present

## 2013-12-01 DIAGNOSIS — N201 Calculus of ureter: Secondary | ICD-10-CM | POA: Diagnosis not present

## 2013-12-05 DIAGNOSIS — E871 Hypo-osmolality and hyponatremia: Secondary | ICD-10-CM | POA: Diagnosis not present

## 2013-12-11 DIAGNOSIS — R42 Dizziness and giddiness: Secondary | ICD-10-CM | POA: Diagnosis not present

## 2013-12-26 DIAGNOSIS — H409 Unspecified glaucoma: Secondary | ICD-10-CM | POA: Diagnosis not present

## 2013-12-26 DIAGNOSIS — H4011X0 Primary open-angle glaucoma, stage unspecified: Secondary | ICD-10-CM | POA: Diagnosis not present

## 2013-12-31 ENCOUNTER — Ambulatory Visit (HOSPITAL_BASED_OUTPATIENT_CLINIC_OR_DEPARTMENT_OTHER): Payer: Medicare Other | Admitting: Radiology

## 2013-12-31 ENCOUNTER — Ambulatory Visit (HOSPITAL_COMMUNITY): Payer: Medicare Other | Attending: Cardiology

## 2013-12-31 VITALS — BP 162/75 | Ht 67.0 in | Wt 171.0 lb

## 2013-12-31 DIAGNOSIS — R0602 Shortness of breath: Secondary | ICD-10-CM

## 2013-12-31 DIAGNOSIS — R0609 Other forms of dyspnea: Secondary | ICD-10-CM | POA: Insufficient documentation

## 2013-12-31 DIAGNOSIS — R079 Chest pain, unspecified: Secondary | ICD-10-CM | POA: Insufficient documentation

## 2013-12-31 DIAGNOSIS — I359 Nonrheumatic aortic valve disorder, unspecified: Secondary | ICD-10-CM

## 2013-12-31 DIAGNOSIS — R0989 Other specified symptoms and signs involving the circulatory and respiratory systems: Secondary | ICD-10-CM | POA: Diagnosis not present

## 2013-12-31 DIAGNOSIS — I351 Nonrheumatic aortic (valve) insufficiency: Secondary | ICD-10-CM

## 2013-12-31 MED ORDER — TECHNETIUM TC 99M SESTAMIBI GENERIC - CARDIOLITE
33.0000 | Freq: Once | INTRAVENOUS | Status: AC | PRN
Start: 2013-12-31 — End: 2013-12-31
  Administered 2013-12-31: 33 via INTRAVENOUS

## 2013-12-31 MED ORDER — TECHNETIUM TC 99M SESTAMIBI GENERIC - CARDIOLITE
11.0000 | Freq: Once | INTRAVENOUS | Status: AC | PRN
  Administered 2013-12-31: 11 via INTRAVENOUS

## 2013-12-31 NOTE — Progress Notes (Signed)
[facility] [address] [place], [place] [address] [phone]    Cardiology Nuclear Med Study  Seth Abbott is a 80 y.o. male     MRN : [id number]     DOB: August 12, 1927  Procedure Date: 12/31/2013  Nuclear Med Background Indication for Stress Test:  Evaluation for Ischemia History:  2014 Echo EF 55-60% Cardiac Risk Factors: Family History - CAD, Hypertension, IDDM Type 2, Lipids and TIA  Symptoms:  Chest Pain and DOE   Nuclear Pre-Procedure Caffeine/Decaff Intake:  None NPO After: 9:00pm   Lungs:  clear O2 Sat: 97% on room air. IV 0.9% NS with Angio Cath:  22g  IV Site: R Hand  IV Started by:  Crissie Figures, RN  Chest Size (in):  46 Cup Size: n/a  Height: 5\' 7"  (1.702 m)  Weight:  171 lb (77.565 kg)  BMI:  Body mass index is 26.78 kg/(m^2). Tech Comments:  CBG @ 7 am = 138    Nuclear Med Study 1 or 2 day study: 1 day  Stress Test Type:  Stress  Reading MD: N/A  Order Authorizing Provider:  Fransico Him, MD  Resting Radionuclide: Technetium 52m Sestamibi  Resting Radionuclide Dose: 11.0 mCi   Stress Radionuclide:  Technetium 10m Sestamibi  Stress Radionuclide Dose: 33.0 mCi           Stress Protocol Rest HR: 80 Stress HR: 126  Rest BP: 162/75 Stress BP: 201/66  Exercise Time (min): 4:00 METS: 5.00   Predicted Max HR: 137 bpm % Max HR: 91.24 bpm Rate Pressure Product: 25125   Dose of Adenosine (mg):  n/a Dose of Lexiscan: n/a mg  Dose of Atropine (mg): n/a Dose of Dobutamine: n/a mcg/kg/min (at max HR)  Stress Test Technologist: Ileene Hutchinson, EMT-P  Nuclear Technologist:  Annye Rusk, CNMT     Rest Procedure:  Myocardial perfusion imaging was performed at rest 45 minutes following the intravenous administration of Technetium 73m Sestamibi. Rest ECG: NSR - Normal EKG  Stress Procedure:  The patient exercised on the treadmill utilizing the Bruce Protocol for 4:00 minutes. The patient stopped due to sob and denied any chest  pain,PVCs. Technetium 90m Sestamibi was injected at peak exercise and myocardial perfusion imaging was performed after a brief delay. Stress ECG: There are scattered PVCs.  QPS Raw Data Images:  Mild diaphragmatic attenuation.  Normal left ventricular size. Stress Images:  Normal homogeneous uptake in all areas of the myocardium. Rest Images:  Normal homogeneous uptake in all areas of the myocardium. Subtraction (SDS):  No evidence of ischemia. Transient Ischemic Dilatation (Normal <1.22):  1.17 Lung/Heart Ratio (Normal <0.45):  0.29  Quantitative Gated Spect Images QGS EDV:  71 ml QGS ESV:  20 ml  Impression Exercise Capacity:  Poor exercise capacity. BP Response:  Hypertensive blood pressure response. Clinical Symptoms:  There is dyspnea. ECG Impression:  No significant ST segment change suggestive of ischemia. Comparison with Prior Nuclear Study: No images to compare  Overall Impression:  Normal stress nuclear study.  LV Ejection Fraction: 71%.  LV Wall Motion:  NL LV Function; NL Wall Motion  Signed: Fransico Him, MD [facility]

## 2013-12-31 NOTE — Progress Notes (Signed)
2D Echo completed. 12/31/2013 

## 2014-01-01 ENCOUNTER — Other Ambulatory Visit: Payer: Self-pay | Admitting: *Deleted

## 2014-01-01 DIAGNOSIS — I77810 Thoracic aortic ectasia: Secondary | ICD-10-CM

## 2014-01-22 DIAGNOSIS — Z23 Encounter for immunization: Secondary | ICD-10-CM | POA: Diagnosis not present

## 2014-01-22 DIAGNOSIS — E663 Overweight: Secondary | ICD-10-CM | POA: Diagnosis not present

## 2014-01-22 DIAGNOSIS — E119 Type 2 diabetes mellitus without complications: Secondary | ICD-10-CM | POA: Diagnosis not present

## 2014-01-22 DIAGNOSIS — Z6829 Body mass index (BMI) 29.0-29.9, adult: Secondary | ICD-10-CM | POA: Diagnosis not present

## 2014-01-22 DIAGNOSIS — E785 Hyperlipidemia, unspecified: Secondary | ICD-10-CM | POA: Diagnosis not present

## 2014-01-22 DIAGNOSIS — I10 Essential (primary) hypertension: Secondary | ICD-10-CM | POA: Diagnosis not present

## 2014-03-05 DIAGNOSIS — N201 Calculus of ureter: Secondary | ICD-10-CM | POA: Diagnosis not present

## 2014-03-25 DIAGNOSIS — Z0001 Encounter for general adult medical examination with abnormal findings: Secondary | ICD-10-CM | POA: Diagnosis not present

## 2014-03-25 DIAGNOSIS — Z23 Encounter for immunization: Secondary | ICD-10-CM | POA: Diagnosis not present

## 2014-03-25 DIAGNOSIS — E119 Type 2 diabetes mellitus without complications: Secondary | ICD-10-CM | POA: Diagnosis not present

## 2014-03-25 DIAGNOSIS — F329 Major depressive disorder, single episode, unspecified: Secondary | ICD-10-CM | POA: Diagnosis not present

## 2014-03-25 DIAGNOSIS — I10 Essential (primary) hypertension: Secondary | ICD-10-CM | POA: Diagnosis not present

## 2014-03-25 DIAGNOSIS — L98491 Non-pressure chronic ulcer of skin of other sites limited to breakdown of skin: Secondary | ICD-10-CM | POA: Diagnosis not present

## 2014-03-25 DIAGNOSIS — E785 Hyperlipidemia, unspecified: Secondary | ICD-10-CM | POA: Diagnosis not present

## 2014-03-25 DIAGNOSIS — Z1389 Encounter for screening for other disorder: Secondary | ICD-10-CM | POA: Diagnosis not present

## 2014-04-21 DIAGNOSIS — D485 Neoplasm of uncertain behavior of skin: Secondary | ICD-10-CM | POA: Diagnosis not present

## 2014-04-21 DIAGNOSIS — L578 Other skin changes due to chronic exposure to nonionizing radiation: Secondary | ICD-10-CM | POA: Diagnosis not present

## 2014-04-23 DIAGNOSIS — F329 Major depressive disorder, single episode, unspecified: Secondary | ICD-10-CM | POA: Diagnosis not present

## 2014-04-23 DIAGNOSIS — R350 Frequency of micturition: Secondary | ICD-10-CM | POA: Diagnosis not present

## 2014-04-23 DIAGNOSIS — Z1389 Encounter for screening for other disorder: Secondary | ICD-10-CM | POA: Diagnosis not present

## 2014-06-23 DIAGNOSIS — R350 Frequency of micturition: Secondary | ICD-10-CM | POA: Diagnosis not present

## 2014-06-30 DIAGNOSIS — H35372 Puckering of macula, left eye: Secondary | ICD-10-CM | POA: Diagnosis not present

## 2014-06-30 DIAGNOSIS — H4011X1 Primary open-angle glaucoma, mild stage: Secondary | ICD-10-CM | POA: Diagnosis not present

## 2014-06-30 DIAGNOSIS — Z961 Presence of intraocular lens: Secondary | ICD-10-CM | POA: Diagnosis not present

## 2014-06-30 DIAGNOSIS — H52203 Unspecified astigmatism, bilateral: Secondary | ICD-10-CM | POA: Diagnosis not present

## 2014-07-15 ENCOUNTER — Encounter: Payer: Self-pay | Admitting: Cardiology

## 2014-08-06 DIAGNOSIS — E119 Type 2 diabetes mellitus without complications: Secondary | ICD-10-CM | POA: Diagnosis not present

## 2014-08-06 DIAGNOSIS — I10 Essential (primary) hypertension: Secondary | ICD-10-CM | POA: Diagnosis not present

## 2014-08-06 LAB — LIPID PANEL
CHOLESTEROL: 144 (ref 0–200)
HDL: 56 (ref 35–70)
LDL Cholesterol: 75
TRIGLYCERIDES: 69 (ref 40–160)

## 2014-08-06 LAB — BASIC METABOLIC PANEL
BUN: 24 — AB (ref 4–21)
CREATININE: 1 (ref 0.6–1.3)
Glucose: 251
POTASSIUM: 4.4 (ref 3.4–5.3)
SODIUM: 136 — AB (ref 137–147)

## 2014-08-06 LAB — HEMOGLOBIN A1C
HEMOGLOBIN A1C: 8
HEMOGLOBIN A1C: 8.3
Hemoglobin A1C: 8

## 2014-08-06 LAB — HEPATIC FUNCTION PANEL
ALK PHOS: 96 (ref 25–125)
ALT: 21 (ref 10–40)
AST: 20 (ref 14–40)
BILIRUBIN, TOTAL: 0.4

## 2014-09-01 DIAGNOSIS — H35372 Puckering of macula, left eye: Secondary | ICD-10-CM | POA: Diagnosis not present

## 2014-09-01 DIAGNOSIS — H43812 Vitreous degeneration, left eye: Secondary | ICD-10-CM | POA: Diagnosis not present

## 2014-09-01 DIAGNOSIS — E11329 Type 2 diabetes mellitus with mild nonproliferative diabetic retinopathy without macular edema: Secondary | ICD-10-CM | POA: Diagnosis not present

## 2014-09-24 DIAGNOSIS — I10 Essential (primary) hypertension: Secondary | ICD-10-CM | POA: Diagnosis not present

## 2014-09-24 DIAGNOSIS — F329 Major depressive disorder, single episode, unspecified: Secondary | ICD-10-CM | POA: Diagnosis not present

## 2014-10-01 ENCOUNTER — Ambulatory Visit: Payer: Medicare Other | Admitting: Cardiology

## 2014-10-22 DIAGNOSIS — E11321 Type 2 diabetes mellitus with mild nonproliferative diabetic retinopathy with macular edema: Secondary | ICD-10-CM | POA: Diagnosis not present

## 2014-10-22 DIAGNOSIS — H35372 Puckering of macula, left eye: Secondary | ICD-10-CM | POA: Diagnosis not present

## 2014-10-22 DIAGNOSIS — H43812 Vitreous degeneration, left eye: Secondary | ICD-10-CM | POA: Diagnosis not present

## 2014-11-06 ENCOUNTER — Ambulatory Visit (INDEPENDENT_AMBULATORY_CARE_PROVIDER_SITE_OTHER): Payer: Medicare Other | Admitting: Cardiology

## 2014-11-06 ENCOUNTER — Encounter: Payer: Self-pay | Admitting: Cardiology

## 2014-11-06 VITALS — BP 130/48 | HR 83 | Ht 67.0 in | Wt 176.8 lb

## 2014-11-06 DIAGNOSIS — I350 Nonrheumatic aortic (valve) stenosis: Secondary | ICD-10-CM | POA: Insufficient documentation

## 2014-11-06 DIAGNOSIS — I77810 Thoracic aortic ectasia: Secondary | ICD-10-CM

## 2014-11-06 DIAGNOSIS — R079 Chest pain, unspecified: Secondary | ICD-10-CM | POA: Diagnosis not present

## 2014-11-06 DIAGNOSIS — I10 Essential (primary) hypertension: Secondary | ICD-10-CM

## 2014-11-06 LAB — BASIC METABOLIC PANEL
BUN: 17 mg/dL (ref 6–23)
CHLORIDE: 102 meq/L (ref 96–112)
CO2: 27 mEq/L (ref 19–32)
Calcium: 9 mg/dL (ref 8.4–10.5)
Creatinine, Ser: 1.03 mg/dL (ref 0.40–1.50)
GFR: 73.04 mL/min (ref >60.00)
Glucose, Bld: 206 mg/dL — ABNORMAL HIGH (ref 70–99)
POTASSIUM: 4.7 meq/L (ref 3.5–5.1)
SODIUM: 134 meq/L — AB (ref 135–145)

## 2014-11-06 NOTE — Patient Instructions (Signed)
Medication Instructions:  Your physician recommends that you continue on your current medications as directed. Please refer to the Current Medication list given to you today.   Labwork: TODAY: BMET  Testing/Procedures: [name] recommends you have a CORONARY CTA.  Follow-Up: Your physician wants you to follow-up in: 1 year with [name]. You will receive a reminder letter in the mail two months in advance. If you don't receive a letter, please call our office to schedule the follow-up appointment.   Any Other Special Instructions Will Be Listed Below (If Applicable).

## 2014-11-06 NOTE — Progress Notes (Signed)
Cardiology Office Note   Date:  11/06/2014   ID:  Seth Abbott, DOB 05-28-1928, MRN [id number]  PCP:  Delrae Rend, MD    Chief Complaint  Patient presents with  . Aortic Stenosis  . Chest Pain      History of Present Illness: Seth Abbott is a 81 y.o. male with history of type 2 DM, TIA, aortic root dilatation and HTN who presents today for followup of intermittent CP that he had a year ago at which time stress test showed no ischemia and echo showed normal LVF with mild AS and mildly dilated aorta.  He says that he still occasionally will have a twinge of chest discomfort that lasts up to 30 minutes. It is usually exertional but denies any diaphoresis or nausea with no radiation.   denies any LE edema, palpitations, dizziness or syncope.     Past Medical History  Diagnosis Date  . Diabetes   . TIA (transient ischemic attack)   . HTN (hypertension)   . Prostatitis   . Shingles   . Hyperlipidemia   . Dilated aortic root   . Aortic stenosis, mild     Past Surgical History  Procedure Laterality Date  . Cataract extraction       Current Outpatient Prescriptions  Medication Sig Dispense Refill  . ACCU-CHEK AVIVA PLUS test strip   12  . aspirin 81 MG tablet Take 81 mg by mouth every morning.     [name] atorvastatin (LIPITOR) 20 MG tablet Take 20 mg by mouth every morning.     . Cimetidine (TAGAMET PO) Take 1 tablet by mouth daily as needed (for heartburn).    . dorzolamide (TRUSOPT) 2 % ophthalmic solution Place 1 drop into both eyes 2 (two) times daily.     . insulin lispro (HUMALOG) 100 UNIT/ML cartridge Inject 10 Units into the skin 2 (two) times daily.     . insulin NPH Human (HUMULIN N,NOVOLIN N) 100 UNIT/ML injection Inject 20 Units into the skin 2 (two) times daily before a meal.    . ramipril (ALTACE) 2.5 MG capsule Take 2.5 mg by mouth daily.  5   No current facility-administered medications for this visit.    Allergies:   Review of patient's allergies  indicates no known allergies.    Social History:  The patient  reports that he has never smoked. He does not have any smokeless tobacco history on file. He reports that he does not drink alcohol or use illicit drugs.   Family History:  The patient's family history includes CAD in his father; COPD in his brother; Diabetes in his father, mother, sister, and sister; Heart attack in his father.    ROS:  Please see the history of present illness.   Otherwise, review of systems are positive for none.   All other systems are reviewed and negative.    PHYSICAL EXAM: VS:  BP 130/48 mmHg  Pulse 83  Ht 5\' 7"  (1.702 m)  Wt 176 lb 12.8 oz (80.196 kg)  BMI 27.68 kg/m2 , BMI Body mass index is 27.68 kg/(m^2). GEN: Well nourished, well developed, in no acute distress HEENT: normal Neck: no JVD, carotid bruits, or masses Cardiac: RRR; no rubs, or gallops,no edema.  1/6 SM at RUSB Respiratory:  clear to auscultation bilaterally, normal work of breathing GI: soft, nontender, nondistended, + BS MS: no deformity or atrophy Skin: warm and dry, no rash Neuro:  Strength and sensation are intact Psych: euthymic mood, full  affect   EKG:  EKG is ordered today. The ekg ordered today demonstrates NSR with IRBBB   Recent Labs: No results found for requested labs within last 365 days.    Lipid Panel No results found for: CHOL, TRIG, HDL, CHOLHDL, VLDL, LDLCALC, LDLDIRECT    Wt Readings from Last 3 Encounters:  11/06/14 176 lb 12.8 oz (80.196 kg)  12/31/13 171 lb (77.565 kg)  11/17/13 167 lb (75.751 kg)      ASSESSMENT AND PLAN:  1. Chest pain with typical and atypical components in a patient with CRF including family history of CAD, HTN, DM.  He had a normal nuclear stress test last year but continues to have episodes of exertional CP and SOB. I will get a coronary CTA of the chest to evaluate calcium score and for CAD. 2. Mild aortic root dilatation getting yearly echoes to follow 3. Mild  AS/AR on echo 2015 4. HTN - controlled on ACE I   Current medicines are reviewed at length with the patient today.  The patient does not have concerns regarding medicines.  The following changes have been made:  no change  Labs/ tests ordered today include: see above assessment and plan No orders of the defined types were placed in this encounter.     Disposition:   FU with me in 1 Year   Signed, Sueanne Margarita, MD  11/06/2014 2:01 PM    [facility] [place], [place], [place]  [address] Phone: [phone]; Fax: [phone]

## 2014-11-13 DIAGNOSIS — Z01818 Encounter for other preprocedural examination: Secondary | ICD-10-CM | POA: Diagnosis not present

## 2014-11-13 DIAGNOSIS — I350 Nonrheumatic aortic (valve) stenosis: Secondary | ICD-10-CM | POA: Diagnosis not present

## 2014-11-19 ENCOUNTER — Ambulatory Visit (HOSPITAL_COMMUNITY): Payer: Medicare Other

## 2014-11-25 ENCOUNTER — Other Ambulatory Visit: Payer: Self-pay

## 2014-11-25 ENCOUNTER — Telehealth: Payer: Self-pay | Admitting: Cardiology

## 2014-11-25 DIAGNOSIS — R079 Chest pain, unspecified: Secondary | ICD-10-CM

## 2014-11-25 NOTE — Telephone Encounter (Signed)
Request for surgical clearance:  1. What type of surgery is being performed? Bitrectomy with membrane peel of L eye   2. When is this surgery scheduled? 12/01/14 under a block anesthesia   3. Are there any medications that need to be held prior to surgery and how long? No   4. Name of physician performing surgery? Dr. Milus Height   5. What is your office phone and fax number? [employer] [phone]

## 2014-11-25 NOTE — Telephone Encounter (Signed)
Informed patient that [name] cannot clear him for surgery without having his coronary CTA. Informed him that our schedulers will call to make a new appointment ASAP.   Updated Dr. Blenda Mounts office and informed them I will call when CTA is scheduled.

## 2014-11-25 NOTE — Telephone Encounter (Signed)
Informed Mary patient is scheduled for tomorrow and clearance will be sent pending results.

## 2014-11-25 NOTE — Telephone Encounter (Signed)
Patient was supposed to have coronary CTA due to ongoing CP

## 2014-11-26 ENCOUNTER — Ambulatory Visit (HOSPITAL_COMMUNITY): Admission: RE | Admit: 2014-11-26 | Payer: Medicare Other | Source: Ambulatory Visit

## 2014-11-26 ENCOUNTER — Other Ambulatory Visit (HOSPITAL_COMMUNITY): Payer: Self-pay | Admitting: Ophthalmology

## 2014-11-26 DIAGNOSIS — H34219 Partial retinal artery occlusion, unspecified eye: Secondary | ICD-10-CM

## 2014-11-26 NOTE — Telephone Encounter (Signed)
Left message to call back  

## 2014-11-26 NOTE — Telephone Encounter (Signed)
Patient cancelled his cardiac CTA.  Informed patient's son the importance of getting the CTA done for cardiac clearance to have surgery.  [facility] rescheduled for June 28.  Informed son the eye office will be called and they will call to inform him of new surgery time.

## 2014-11-26 NOTE — Telephone Encounter (Signed)
New Prob   Pts son states pt is not feeling well and wanting to reschedule study planned for tomorrow. Requesting to speak to a nurse. Please call.

## 2014-11-27 ENCOUNTER — Ambulatory Visit (HOSPITAL_COMMUNITY): Payer: Medicare Other

## 2014-11-27 ENCOUNTER — Telehealth: Payer: Self-pay | Admitting: Cardiology

## 2014-11-27 ENCOUNTER — Ambulatory Visit (HOSPITAL_COMMUNITY): Payer: Medicare Other | Attending: Cardiovascular Disease

## 2014-11-27 DIAGNOSIS — I6523 Occlusion and stenosis of bilateral carotid arteries: Secondary | ICD-10-CM | POA: Diagnosis not present

## 2014-11-27 DIAGNOSIS — H34219 Partial retinal artery occlusion, unspecified eye: Secondary | ICD-10-CM

## 2014-11-27 DIAGNOSIS — H3589 Other specified retinal disorders: Secondary | ICD-10-CM

## 2014-11-27 NOTE — Telephone Encounter (Signed)
Informed patient that I did not call him, that it may have been another office.  Confirmed with patient CTA date, time and place and emphasized the importance of keeping the appointment so he can be cleared for surgery. Patient agrees with treatment plan.

## 2014-11-27 NOTE — Telephone Encounter (Signed)
Informed RN that patient cancelled CTA yesterday and therefore will not be cleared before Tuesday. CTA rescheduled for June 28. [facility] to call patient and reschedule procedure.

## 2014-11-27 NOTE — Telephone Encounter (Signed)
F/u   Pt returning your  Call.

## 2014-11-27 NOTE — Telephone Encounter (Signed)
Follow Up ° ° ° ° ° °Pt returning Katy's phone call. °

## 2014-11-27 NOTE — Telephone Encounter (Signed)
[facility] calling to confirm surgery is cancelled for Tuesday. They request clearance be sent when the patient has completed testing.

## 2014-12-08 ENCOUNTER — Encounter (HOSPITAL_COMMUNITY): Payer: Self-pay

## 2014-12-08 ENCOUNTER — Telehealth: Payer: Self-pay | Admitting: Cardiology

## 2014-12-08 ENCOUNTER — Ambulatory Visit (HOSPITAL_COMMUNITY)
Admission: RE | Admit: 2014-12-08 | Discharge: 2014-12-08 | Disposition: A | Discharge status: Home / Self Care | Payer: Medicare Other | Source: Ambulatory Visit | Attending: Cardiology | Admitting: Cardiology

## 2014-12-08 DIAGNOSIS — R079 Chest pain, unspecified: Secondary | ICD-10-CM | POA: Insufficient documentation

## 2014-12-08 DIAGNOSIS — I350 Nonrheumatic aortic (valve) stenosis: Secondary | ICD-10-CM | POA: Diagnosis not present

## 2014-12-08 DIAGNOSIS — K449 Diaphragmatic hernia without obstruction or gangrene: Secondary | ICD-10-CM | POA: Insufficient documentation

## 2014-12-08 DIAGNOSIS — J8410 Pulmonary fibrosis, unspecified: Secondary | ICD-10-CM | POA: Insufficient documentation

## 2014-12-08 DIAGNOSIS — I2510 Atherosclerotic heart disease of native coronary artery without angina pectoris: Secondary | ICD-10-CM | POA: Diagnosis not present

## 2014-12-08 DIAGNOSIS — J439 Emphysema, unspecified: Secondary | ICD-10-CM | POA: Insufficient documentation

## 2014-12-08 MED ORDER — METOPROLOL TARTRATE 1 MG/ML IV SOLN
2.5000 mg | Freq: Once | INTRAVENOUS | Status: AC
  Administered 2014-12-08: 2.5 mg via INTRAVENOUS
  Filled 2014-12-08: qty 5

## 2014-12-08 MED ORDER — METOPROLOL TARTRATE 1 MG/ML IV SOLN
INTRAVENOUS | Status: AC
  Filled 2014-12-08: qty 5

## 2014-12-08 MED ORDER — NITROGLYCERIN 0.4 MG SL SUBL
0.8000 mg | SUBLINGUAL_TABLET | Freq: Once | SUBLINGUAL | Status: AC
  Administered 2014-12-08: 0.8 mg via SUBLINGUAL
  Filled 2014-12-08: qty 25

## 2014-12-08 MED ORDER — NITROGLYCERIN 0.4 MG SL SUBL
SUBLINGUAL_TABLET | SUBLINGUAL | Status: AC
  Filled 2014-12-08: qty 2

## 2014-12-08 MED ORDER — METOPROLOL TARTRATE 1 MG/ML IV SOLN
5.0000 mg | Freq: Once | INTRAVENOUS | Status: AC
  Administered 2014-12-08: 5 mg via INTRAVENOUS
  Filled 2014-12-08: qty 5

## 2014-12-08 MED ORDER — IOHEXOL 350 MG/ML SOLN
80.0000 mL | Freq: Once | INTRAVENOUS | Status: AC | PRN
  Administered 2014-12-08: 80 mL via INTRAVENOUS

## 2014-12-08 NOTE — Telephone Encounter (Signed)
Informed Stanton Kidney that [name] has not been cleared for surgery. He will need to undergo further testing. Stanton Kidney is grateful for callback.

## 2014-12-08 NOTE — Telephone Encounter (Signed)
Dr. Meda Coffee notified me today that patient had coronary CTA today which showed a very large calcium score of 1900 with at least 50-70% distal LM disease and proximal RCA disease.  Patient needs to be set up for cath ASAP in next 2 days.

## 2014-12-08 NOTE — Telephone Encounter (Signed)
Explained to patient, in great detail, results of CTA today and informed him he needs a catheterization in the next 2 days. Explained to patient the catheterization procedure and possible risks. Patient refuses to schedule at this time, even after much encouragement. Called patient's son and explained results and recommendations to him per patient request. Pt's son st they will call tomorrow to schedule.

## 2014-12-08 NOTE — Telephone Encounter (Signed)
New Message     Stanton Kidney is the RN for Dr. Milus Height at [facility] [phone]   Stanton Kidney needs to know if [name] has approved pt for eye surgery and if  A written statement can be faxed.  Fax number  [phone]   Attention [facility]:

## 2014-12-09 ENCOUNTER — Telehealth: Payer: Self-pay | Admitting: Cardiology

## 2014-12-09 ENCOUNTER — Encounter (HOSPITAL_COMMUNITY): Payer: Self-pay | Admitting: Pharmacy Technician

## 2014-12-09 ENCOUNTER — Other Ambulatory Visit: Payer: Self-pay | Admitting: Cardiology

## 2014-12-09 DIAGNOSIS — I2510 Atherosclerotic heart disease of native coronary artery without angina pectoris: Secondary | ICD-10-CM

## 2014-12-09 NOTE — Telephone Encounter (Signed)
New problem ° ° °Pt returning your call from yesterday. Please call pt. °

## 2014-12-09 NOTE — Telephone Encounter (Signed)
Patient only needs left heart cath not right cath

## 2014-12-09 NOTE — Telephone Encounter (Signed)
Attempted to call - line is busy.  Will attempt to call later.

## 2014-12-09 NOTE — Telephone Encounter (Signed)
Heart catheterization scheduled Friday, July 1 at 1200 with Dr. Tamala Julian. Instructed patient to arrive at the [facility] at 1000. Instructed patient not to eat past midnight and to hold his insulin the AM of his catheterization. He understands to plan to stay one night and his son is his driver. Labs to be drawn upon arrival.

## 2014-12-09 NOTE — Telephone Encounter (Signed)
Heart catheterization scheduled Friday, July 1 at 1200 with Dr. Tamala Julian. Instructed patient to arrive at the [facility] at 1000. Instructed patient not to eat past midnight and to hold his insulin the AM of his catheterization. He understands to plan to stay one night and his son is his driver. Labs to be drawn upon arrival.

## 2014-12-10 NOTE — Telephone Encounter (Signed)
Instructed Santiago Glad in Cath Lab to change to L heart cath only.

## 2014-12-11 ENCOUNTER — Ambulatory Visit (HOSPITAL_COMMUNITY)
Admission: RE | Admit: 2014-12-11 | Discharge: 2014-12-11 | Disposition: A | Discharge status: Home / Self Care | Payer: Medicare Other | Source: Ambulatory Visit | Attending: Interventional Cardiology | Admitting: Interventional Cardiology

## 2014-12-11 ENCOUNTER — Encounter (HOSPITAL_COMMUNITY)
Admission: RE | Disposition: A | Discharge status: Home / Self Care | Payer: Medicare Other | Source: Ambulatory Visit | Attending: Interventional Cardiology

## 2014-12-11 DIAGNOSIS — I10 Essential (primary) hypertension: Secondary | ICD-10-CM | POA: Insufficient documentation

## 2014-12-11 DIAGNOSIS — I350 Nonrheumatic aortic (valve) stenosis: Secondary | ICD-10-CM | POA: Insufficient documentation

## 2014-12-11 DIAGNOSIS — Z8673 Personal history of transient ischemic attack (TIA), and cerebral infarction without residual deficits: Secondary | ICD-10-CM | POA: Insufficient documentation

## 2014-12-11 DIAGNOSIS — Z7982 Long term (current) use of aspirin: Secondary | ICD-10-CM | POA: Diagnosis not present

## 2014-12-11 DIAGNOSIS — E785 Hyperlipidemia, unspecified: Secondary | ICD-10-CM | POA: Insufficient documentation

## 2014-12-11 DIAGNOSIS — R079 Chest pain, unspecified: Secondary | ICD-10-CM | POA: Diagnosis present

## 2014-12-11 DIAGNOSIS — I2510 Atherosclerotic heart disease of native coronary artery without angina pectoris: Secondary | ICD-10-CM | POA: Insufficient documentation

## 2014-12-11 DIAGNOSIS — R938 Abnormal findings on diagnostic imaging of other specified body structures: Secondary | ICD-10-CM | POA: Diagnosis present

## 2014-12-11 DIAGNOSIS — Z794 Long term (current) use of insulin: Secondary | ICD-10-CM | POA: Insufficient documentation

## 2014-12-11 DIAGNOSIS — Z8249 Family history of ischemic heart disease and other diseases of the circulatory system: Secondary | ICD-10-CM | POA: Diagnosis not present

## 2014-12-11 DIAGNOSIS — E119 Type 2 diabetes mellitus without complications: Secondary | ICD-10-CM | POA: Insufficient documentation

## 2014-12-11 DIAGNOSIS — R0789 Other chest pain: Secondary | ICD-10-CM | POA: Insufficient documentation

## 2014-12-11 DIAGNOSIS — R0609 Other forms of dyspnea: Secondary | ICD-10-CM

## 2014-12-11 HISTORY — PX: CARDIAC CATHETERIZATION: SHX172

## 2014-12-11 LAB — BASIC METABOLIC PANEL
Anion gap: 10 (ref 5–15)
BUN: 16 mg/dL (ref 6–20)
CO2: 25 mmol/L (ref 22–32)
Calcium: 9.1 mg/dL (ref 8.9–10.3)
Chloride: 101 mmol/L (ref 101–111)
Creatinine, Ser: 0.89 mg/dL (ref 0.61–1.24)
GFR calc Af Amer: >60 mL/min (ref >60)
GFR calc non Af Amer: >60 mL/min (ref >60)
Glucose, Bld: 140 mg/dL — ABNORMAL HIGH (ref 65–99)
Potassium: 4.3 mmol/L (ref 3.5–5.1)
Sodium: 136 mmol/L (ref 135–145)

## 2014-12-11 LAB — CBC
HCT: 41.4 % (ref 39.0–52.0)
Hemoglobin: 13.9 g/dL (ref 13.0–17.0)
MCH: 30 pg (ref 26.0–34.0)
MCHC: 33.6 g/dL (ref 30.0–36.0)
MCV: 89.2 fL (ref 78.0–100.0)
Platelets: 185 10*3/uL (ref 150–400)
RBC: 4.64 MIL/uL (ref 4.22–5.81)
RDW: 13.3 % (ref 11.5–15.5)
WBC: 5.4 10*3/uL (ref 4.0–10.5)

## 2014-12-11 LAB — PROTIME-INR
INR: 1.09 (ref 0.00–1.49)
Prothrombin Time: 14.3 seconds (ref 11.6–15.2)

## 2014-12-11 LAB — GLUCOSE, CAPILLARY
GLUCOSE-CAPILLARY: 129 mg/dL — AB (ref 65–99)
Glucose-Capillary: 129 mg/dL — ABNORMAL HIGH (ref 65–99)

## 2014-12-11 SURGERY — LEFT HEART CATH AND CORONARY ANGIOGRAPHY

## 2014-12-11 MED ORDER — ONDANSETRON HCL 4 MG/2ML IJ SOLN: 4.0000 mg | Freq: Four times a day (QID) | INTRAMUSCULAR | Status: DC | PRN

## 2014-12-11 MED ORDER — MIDAZOLAM HCL 2 MG/2ML IJ SOLN
INTRAMUSCULAR | Status: DC | PRN
Start: 2014-12-11 — End: 2014-12-11
  Administered 2014-12-11: 1 mg via INTRAVENOUS

## 2014-12-11 MED ORDER — HEPARIN SODIUM (PORCINE) 1000 UNIT/ML IJ SOLN
INTRAMUSCULAR | Status: AC
  Filled 2014-12-11: qty 1

## 2014-12-11 MED ORDER — IOHEXOL 350 MG/ML SOLN
INTRAVENOUS | Status: DC | PRN
  Administered 2014-12-11: 100 mL via INTRA_ARTERIAL

## 2014-12-11 MED ORDER — HEPARIN (PORCINE) IN NACL 2-0.9 UNIT/ML-% IJ SOLN
INTRAMUSCULAR | Status: AC
  Filled 2014-12-11: qty 1000

## 2014-12-11 MED ORDER — VERAPAMIL HCL 2.5 MG/ML IV SOLN
INTRAVENOUS | Status: DC | PRN
  Administered 2014-12-11: 08:00:00 via INTRA_ARTERIAL

## 2014-12-11 MED ORDER — SODIUM CHLORIDE 0.9 % IV SOLN: 250.0000 mL | INTRAVENOUS | Status: DC | PRN

## 2014-12-11 MED ORDER — MIDAZOLAM HCL 2 MG/2ML IJ SOLN
INTRAMUSCULAR | Status: AC
  Filled 2014-12-11: qty 2

## 2014-12-11 MED ORDER — FENTANYL CITRATE (PF) 100 MCG/2ML IJ SOLN
INTRAMUSCULAR | Status: AC
  Filled 2014-12-11: qty 2

## 2014-12-11 MED ORDER — SODIUM CHLORIDE 0.9 % WEIGHT BASED INFUSION
3.0000 mL/kg/h | INTRAVENOUS | Status: DC
  Administered 2014-12-11: 3 mL/kg/h via INTRAVENOUS

## 2014-12-11 MED ORDER — METOPROLOL SUCCINATE ER 25 MG PO TB24: 25.0000 mg | ORAL_TABLET | Freq: Every day | ORAL | Status: DC

## 2014-12-11 MED ORDER — ASPIRIN 81 MG PO CHEW: 81.0000 mg | CHEWABLE_TABLET | ORAL | Status: DC

## 2014-12-11 MED ORDER — LIDOCAINE HCL (PF) 1 % IJ SOLN
INTRAMUSCULAR | Status: AC
  Filled 2014-12-11: qty 30

## 2014-12-11 MED ORDER — ASPIRIN 81 MG PO CHEW: 81.0000 mg | CHEWABLE_TABLET | Freq: Every day | ORAL | Status: DC

## 2014-12-11 MED ORDER — SODIUM CHLORIDE 0.9 % WEIGHT BASED INFUSION: 3.0000 mL/kg/h | INTRAVENOUS | Status: DC

## 2014-12-11 MED ORDER — SODIUM CHLORIDE 0.9 % IJ SOLN: 3.0000 mL | INTRAMUSCULAR | Status: DC | PRN

## 2014-12-11 MED ORDER — SODIUM CHLORIDE 0.9 % WEIGHT BASED INFUSION: 1.0000 mL/kg/h | INTRAVENOUS | Status: DC

## 2014-12-11 MED ORDER — SODIUM CHLORIDE 0.9 % IJ SOLN: 3.0000 mL | Freq: Two times a day (BID) | INTRAMUSCULAR | Status: DC

## 2014-12-11 MED ORDER — ACETAMINOPHEN 325 MG PO TABS: 650.0000 mg | ORAL_TABLET | ORAL | Status: DC | PRN

## 2014-12-11 MED ORDER — FENTANYL CITRATE (PF) 100 MCG/2ML IJ SOLN
INTRAMUSCULAR | Status: DC | PRN
  Administered 2014-12-11 (×2): 50 ug via INTRAVENOUS

## 2014-12-11 MED ORDER — HEPARIN SODIUM (PORCINE) 1000 UNIT/ML IJ SOLN
INTRAMUSCULAR | Status: DC | PRN
  Administered 2014-12-11: 4000 [IU] via INTRAVENOUS

## 2014-12-11 MED ORDER — ASPIRIN 81 MG PO CHEW
CHEWABLE_TABLET | ORAL | Status: AC
  Administered 2014-12-11: 81 mg
  Filled 2014-12-11: qty 1

## 2014-12-11 SURGICAL SUPPLY — 12 items
CATH INFINITI 5 FR JL3.5 (CATHETERS) ×3 IMPLANT
CATH INFINITI JR4 5F (CATHETERS) ×3 IMPLANT
CATH SITESEER 5F MULTI A 2 (CATHETERS) IMPLANT
DEVICE RAD COMP TR BAND LRG (VASCULAR PRODUCTS) ×3 IMPLANT
GLIDESHEATH SLEND A-KIT 6F 22G (SHEATH) ×3 IMPLANT
KIT HEART LEFT (KITS) ×3 IMPLANT
PACK CARDIAC CATHETERIZATION (CUSTOM PROCEDURE TRAY) ×3 IMPLANT
SHEATH PINNACLE 5F 10CM (SHEATH) IMPLANT
TRANSDUCER W/STOPCOCK (MISCELLANEOUS) ×3 IMPLANT
TUBING CIL FLEX 10 FLL-RA (TUBING) ×3 IMPLANT
WIRE EMERALD 3MM-J .035X150CM (WIRE) IMPLANT
WIRE SAFE-T 1.5MM-J .035X260CM (WIRE) ×3 IMPLANT

## 2014-12-11 NOTE — H&P (Signed)
Seth Abbott is a 81 y.o. male  Admit Date: 12/11/2014 Referring Physician: Dagmar Hait, MD Primary Cardiologist: HWBSmith, III, MD Chief complaint / reason for admission: Outdated H and P prior to Coronary Angio  HPI: 81 year old gentleman referred for coronary angiography after coronary CTA demonstrated a very high calcium score and possible 50-70% distal left main. He has history of a dilated aortic root, type 2 diabetes, transient ischemic attack, and hypertension. He has had intermittent chest pain for greater than a year. An echocardiogram demonstrated mild aortic stenosis. Chest pain was described as typical and atypical. A nuclear stress test one year ago was normal. He states the nuclear test was done but he is not sure why.  He sought clearance for eye surgery but after the CT angio, this was not granted  PMH:    Past Medical History  Diagnosis Date  . Diabetes   . TIA (transient ischemic attack)   . HTN (hypertension)   . Prostatitis   . Shingles   . Hyperlipidemia   . Dilated aortic root   . Aortic stenosis, mild     PSH:    Past Surgical History  Procedure Laterality Date  . Cataract extraction     ALLERGIES:   Review of patient's allergies indicates no known allergies. Prior to Admit Meds:   Prescriptions prior to admission  Medication Sig Dispense Refill Last Dose  . ACCU-CHEK AVIVA PLUS test strip   12 Taking  . aspirin 81 MG tablet Take 81 mg by mouth every morning.    12/10/2014 at Unknown time  . atorvastatin (LIPITOR) 20 MG tablet Take 20 mg by mouth every morning.    12/10/2014 at Unknown time  . Cimetidine (TAGAMET PO) Take 1 tablet by mouth daily as needed (for heartburn).   12/10/2014 at Unknown time  . insulin lispro (HUMALOG) 100 UNIT/ML cartridge Inject 10 Units into the skin 2 (two) times daily.    12/10/2014 at 1800  . insulin NPH Human (HUMULIN N,NOVOLIN N) 100 UNIT/ML injection Inject 20 Units into the skin 2 (two) times daily before a meal.    12/10/2014 at 1800  . ramipril (ALTACE) 2.5 MG capsule Take 2.5 mg by mouth daily.  5 12/10/2014 at Unknown time  . dorzolamide (TRUSOPT) 2 % ophthalmic solution Place 1 drop into both eyes 2 (two) times daily.    More than a month at Unknown time   Family HX:    Family History  Problem Relation Age of Onset  . Diabetes Mother   . Heart attack Father   . Diabetes Father   . CAD Father   . Diabetes Sister   . COPD Brother   . Diabetes Sister    Social HX:    History   Social History  . Marital Status: Married    Spouse Name: N/A  . Number of Children: N/A  . Years of Education: N/A   Occupational History  . Not on file.   Social History Main Topics  . Smoking status: Never Smoker   . Smokeless tobacco: Not on file  . Alcohol Use: No  . Drug Use: No  . Sexual Activity: Not on file   Other Topics Concern  . Not on file   Social History Narrative     ROS : Physically active. Has a lot of fatigue. Has some exertional dyspnea. 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  Physical Exam: Blood pressure 142/84, pulse 78, temperature  97.9 F (36.6 C), temperature source Oral, resp. rate 20, height 5\' 7"  (1.702 m), weight 78.019 kg (172 lb), SpO2 96 %.    Decreased vision left eye Moderate JVD lying at 30. Faint left carotid bruit. Chest is clear to auscultation and percussion.  Cardiac exam reveals a 1 to 2/6 systolic murmur right upper sternal border. Abdomen is soft. No bruits are heard.  Extremities reveal no edema. Radial and femoral pulses are 2+. Neurological exam is unremarkable  Labs: Lab Results  Component Value Date   WBC 5.4 12/11/2014   HGB 13.9 12/11/2014   HCT 41.4 12/11/2014   MCV 89.2 12/11/2014   PLT 185 12/11/2014    Recent Labs Lab 12/11/14 0627  NA 136  K 4.3  CL 101  CO2 25  BUN 16  CREATININE 0.89  CALCIUM 9.1  GLUCOSE 140*   Lab Results  Component Value Date   TROPONINI <0.30 10/12/2013      Radiology:  No  recent studies   EKG:  Normal sinus rhythm and otherwise normal   ASSESSMENT:  1. Coronary artery disease documented by coronary CT angio with suspicion of significant left main disease. Minimal chest discomfort and anginal complaints. He does have exertional dyspnea. This study is requested to define anatomy.  2. Diabetes mellitus with complications 3. Hypertension 4. Mild aortic stenosis 5. Dilated aortic root 6. Hyperlipidemia  Plan:  1. Coronary angiography to define anatomy. 2. Unless very critical disease the patient will declined any intervention until his eye is operated upon.  Sinclair Grooms 12/11/2014 7:30 AM

## 2014-12-11 NOTE — Discharge Instructions (Signed)
Radial Site Care °Refer to this sheet in the next few weeks. These instructions provide you with information on caring for yourself after your procedure. Your caregiver may also give you more specific instructions. Your treatment has been planned according to current medical practices, but problems sometimes occur. Call your caregiver if you have any problems or questions after your procedure. °HOME CARE INSTRUCTIONS °· You may shower the day after the procedure. Remove the bandage (dressing) and gently wash the site with plain soap and water. Gently pat the site dry. °· Do not apply powder or lotion to the site. °· Do not submerge the affected site in water for 3 to 5 days. °· Inspect the site at least twice daily. °· Do not flex or bend the affected arm for 24 hours. °· No lifting over 5 pounds (2.3 kg) for 5 days after your procedure. °· Do not drive home if you are discharged the same day of the procedure. Have someone else drive you. °· You may drive 24 hours after the procedure unless otherwise instructed by your caregiver. °· Do not operate machinery or power tools for 24 hours. °· A responsible adult should be with you for the first 24 hours after you arrive home. °What to expect: °· Any bruising will usually fade within 1 to 2 weeks. °· Blood that collects in the tissue (hematoma) may be painful to the touch. It should usually decrease in size and tenderness within 1 to 2 weeks. °SEEK IMMEDIATE MEDICAL CARE IF: °· You have unusual pain at the radial site. °· You have redness, warmth, swelling, or pain at the radial site. °· You have drainage (other than a small amount of blood on the dressing). °· You have chills. °· You have a fever or persistent symptoms for more than 72 hours. °· You have a fever and your symptoms suddenly get worse. °· Your arm becomes pale, cool, tingly, or numb. °· You have heavy bleeding from the site. Hold pressure on the site. °Document Released: 07/01/2010 Document Revised:  08/21/2011 Document Reviewed: 07/01/2010 °ExitCare® Patient Information ©2015 [employer]. This information is not intended to replace advice given to you by your health care provider. Make sure you discuss any questions you have with your health care provider. ° °

## 2014-12-15 ENCOUNTER — Encounter (HOSPITAL_COMMUNITY): Payer: Self-pay | Admitting: Interventional Cardiology

## 2014-12-16 ENCOUNTER — Telehealth: Payer: Self-pay | Admitting: Cardiology

## 2014-12-16 MED FILL — Lidocaine HCl Local Preservative Free (PF) Inj 1%: INTRAMUSCULAR | Qty: 30 | Status: AC

## 2014-12-16 MED FILL — Heparin Sodium (Porcine) 2 Unit/ML in Sodium Chloride 0.9%: INTRAMUSCULAR | Qty: 500 | Status: AC

## 2014-12-16 NOTE — Telephone Encounter (Signed)
New message       Request for surgical clearance:  1. What type of surgery is being performed?  Removal of retina membrane  When is this surgery scheduled? Pending clearance---want to have it in august 2. Are there any medications that need to be held prior to surgery and how long? Not sure  3. Name of physician performing surgery? Dr Milus Height  4. What is your office phone and fax number? Fax [phone]

## 2014-12-16 NOTE — Telephone Encounter (Signed)
Mary from [facility] op[facility]mology clinic called to request a surgical clearance for pt. Pt is to have  removal of epiretinal membrane of left eye. [name] Pt will have topical anesthetics and conscious sedation. The procedure   would take about 90 minutes. Pt had a cardiac catheterization on 12/11/14. Pt will be scheduled for the procedure as soon as he is clear for surgery. Hoping to be scheduled for the first or second week of august 2016. Fax clearance to [phone].

## 2014-12-20 NOTE — Telephone Encounter (Signed)
Patient stable for eye surgery and then needs to get clearance from eye MD when it is safe to be on antiplatelet agent for PCI of RCA and let us know

## 2014-12-22 NOTE — Telephone Encounter (Signed)
Spoke with Stanton Kidney at [facility].  Informed her that patient has been cleared for surgery, but will need a stent placed afterward.  Instructed her to inform us when it is safe to be on an anti-platelet agent. Stanton Kidney agrees to pass along to Dr. Oval Linsey and st she will let us know.  Clearance faxed to [phone].

## 2014-12-23 DIAGNOSIS — H4011X1 Primary open-angle glaucoma, mild stage: Secondary | ICD-10-CM | POA: Diagnosis not present

## 2015-01-11 HISTORY — PX: MEMBRANE PEEL: SHX5967

## 2015-01-12 DIAGNOSIS — H35372 Puckering of macula, left eye: Secondary | ICD-10-CM | POA: Diagnosis not present

## 2015-01-21 DIAGNOSIS — H35372 Puckering of macula, left eye: Secondary | ICD-10-CM | POA: Diagnosis not present

## 2015-01-21 NOTE — Telephone Encounter (Addendum)
Seth Abbott states patient had follow-up appointment today and has been cleared for PCI. Adolph Pollack to ask Dr. Oval Linsey when it is OK for patient to start an anti-platelet agent. Per Dr. Oval Linsey, patient is fine to start anti-platelet agent when [name] wishes.  To [name].

## 2015-01-22 NOTE — Telephone Encounter (Signed)
Please schedule patient to see Dr. Tamala Julian in the next 1-2 weeks to get set up for PCI

## 2015-01-25 ENCOUNTER — Ambulatory Visit: Payer: Medicare Other | Admitting: Physician Assistant

## 2015-01-29 ENCOUNTER — Other Ambulatory Visit: Payer: Self-pay | Admitting: *Deleted

## 2015-01-29 ENCOUNTER — Encounter: Payer: Self-pay | Admitting: *Deleted

## 2015-01-29 DIAGNOSIS — Z01818 Encounter for other preprocedural examination: Secondary | ICD-10-CM

## 2015-01-29 DIAGNOSIS — Z7982 Long term (current) use of aspirin: Secondary | ICD-10-CM

## 2015-01-29 DIAGNOSIS — R931 Abnormal findings on diagnostic imaging of heart and coronary circulation: Secondary | ICD-10-CM

## 2015-01-29 DIAGNOSIS — R079 Chest pain, unspecified: Secondary | ICD-10-CM

## 2015-01-29 DIAGNOSIS — Z01812 Encounter for preprocedural laboratory examination: Secondary | ICD-10-CM

## 2015-02-01 NOTE — Telephone Encounter (Signed)
PCI scheduled 8/30 by Rip Harbour, RN last week. Patient coming for pre-cath lab work 8/26.

## 2015-02-03 ENCOUNTER — Other Ambulatory Visit: Payer: Self-pay | Admitting: Interventional Cardiology

## 2015-02-05 ENCOUNTER — Other Ambulatory Visit (INDEPENDENT_AMBULATORY_CARE_PROVIDER_SITE_OTHER): Payer: Medicare Other

## 2015-02-05 DIAGNOSIS — Z794 Long term (current) use of insulin: Secondary | ICD-10-CM | POA: Diagnosis not present

## 2015-02-05 DIAGNOSIS — Z7982 Long term (current) use of aspirin: Secondary | ICD-10-CM | POA: Diagnosis not present

## 2015-02-05 DIAGNOSIS — R079 Chest pain, unspecified: Secondary | ICD-10-CM | POA: Diagnosis not present

## 2015-02-05 DIAGNOSIS — Z01812 Encounter for preprocedural laboratory examination: Secondary | ICD-10-CM | POA: Diagnosis not present

## 2015-02-05 DIAGNOSIS — I10 Essential (primary) hypertension: Secondary | ICD-10-CM | POA: Diagnosis not present

## 2015-02-05 DIAGNOSIS — E1165 Type 2 diabetes mellitus with hyperglycemia: Secondary | ICD-10-CM | POA: Diagnosis not present

## 2015-02-05 LAB — BASIC METABOLIC PANEL
BUN: 22 mg/dL (ref 6–23)
CHLORIDE: 101 meq/L (ref 96–112)
CO2: 26 meq/L (ref 19–32)
Calcium: 9.4 mg/dL (ref 8.4–10.5)
Creatinine, Ser: 1.36 mg/dL (ref 0.40–1.50)
GFR: 52.97 mL/min — ABNORMAL LOW (ref >60.00)
GLUCOSE: 268 mg/dL — AB (ref 70–99)
POTASSIUM: 4.8 meq/L (ref 3.5–5.1)
SODIUM: 134 meq/L — AB (ref 135–145)

## 2015-02-05 LAB — CBC WITH DIFFERENTIAL/PLATELET
BASOS PCT: 0.7 % (ref 0.0–3.0)
Basophils Absolute: 0 10*3/uL (ref 0.0–0.1)
EOS PCT: 3.4 % (ref 0.0–5.0)
Eosinophils Absolute: 0.2 10*3/uL (ref 0.0–0.7)
HCT: 42.2 % (ref 39.0–52.0)
Hemoglobin: 14.2 g/dL (ref 13.0–17.0)
LYMPHS ABS: 0.6 10*3/uL — AB (ref 0.7–4.0)
Lymphocytes Relative: 11.7 % — ABNORMAL LOW (ref 12.0–46.0)
MCHC: 33.7 g/dL (ref 30.0–36.0)
MCV: 90.9 fl (ref 78.0–100.0)
MONOS PCT: 8.7 % (ref 3.0–12.0)
Monocytes Absolute: 0.5 10*3/uL (ref 0.1–1.0)
NEUTROS ABS: 4 10*3/uL (ref 1.4–7.7)
NEUTROS PCT: 75.5 % (ref 43.0–77.0)
PLATELETS: 218 10*3/uL (ref 150.0–400.0)
RBC: 4.64 Mil/uL (ref 4.22–5.81)
RDW: 14.1 % (ref 11.5–15.5)
WBC: 5.4 10*3/uL (ref 4.0–10.5)

## 2015-02-05 LAB — APTT: aPTT: 29.1 s (ref 23.4–32.7)

## 2015-02-05 LAB — PROTIME-INR
INR: 1 ratio (ref 0.8–1.0)
Prothrombin Time: 11.6 s (ref 9.6–13.1)

## 2015-02-06 ENCOUNTER — Other Ambulatory Visit: Payer: Self-pay | Admitting: Interventional Cardiology

## 2015-02-09 ENCOUNTER — Ambulatory Visit (HOSPITAL_COMMUNITY)
Admission: RE | Admit: 2015-02-09 | Discharge: 2015-02-10 | Disposition: A | Discharge status: Home / Self Care | Payer: Medicare Other | Source: Ambulatory Visit | Attending: Interventional Cardiology | Admitting: Interventional Cardiology

## 2015-02-09 ENCOUNTER — Encounter (HOSPITAL_COMMUNITY)
Admission: RE | Disposition: A | Discharge status: Home / Self Care | Payer: Self-pay | Source: Ambulatory Visit | Attending: Interventional Cardiology

## 2015-02-09 ENCOUNTER — Encounter (HOSPITAL_COMMUNITY): Payer: Self-pay | Admitting: Interventional Cardiology

## 2015-02-09 DIAGNOSIS — I2584 Coronary atherosclerosis due to calcified coronary lesion: Secondary | ICD-10-CM | POA: Insufficient documentation

## 2015-02-09 DIAGNOSIS — R079 Chest pain, unspecified: Secondary | ICD-10-CM | POA: Diagnosis present

## 2015-02-09 DIAGNOSIS — N419 Inflammatory disease of prostate, unspecified: Secondary | ICD-10-CM | POA: Diagnosis present

## 2015-02-09 DIAGNOSIS — Z8249 Family history of ischemic heart disease and other diseases of the circulatory system: Secondary | ICD-10-CM | POA: Insufficient documentation

## 2015-02-09 DIAGNOSIS — I10 Essential (primary) hypertension: Secondary | ICD-10-CM | POA: Insufficient documentation

## 2015-02-09 DIAGNOSIS — E119 Type 2 diabetes mellitus without complications: Secondary | ICD-10-CM | POA: Diagnosis not present

## 2015-02-09 DIAGNOSIS — Z794 Long term (current) use of insulin: Secondary | ICD-10-CM | POA: Insufficient documentation

## 2015-02-09 DIAGNOSIS — I350 Nonrheumatic aortic (valve) stenosis: Secondary | ICD-10-CM | POA: Diagnosis not present

## 2015-02-09 DIAGNOSIS — Z7982 Long term (current) use of aspirin: Secondary | ICD-10-CM | POA: Insufficient documentation

## 2015-02-09 DIAGNOSIS — I2510 Atherosclerotic heart disease of native coronary artery without angina pectoris: Secondary | ICD-10-CM | POA: Diagnosis present

## 2015-02-09 DIAGNOSIS — Z8673 Personal history of transient ischemic attack (TIA), and cerebral infarction without residual deficits: Secondary | ICD-10-CM | POA: Insufficient documentation

## 2015-02-09 DIAGNOSIS — I25118 Atherosclerotic heart disease of native coronary artery with other forms of angina pectoris: Secondary | ICD-10-CM

## 2015-02-09 DIAGNOSIS — I25119 Atherosclerotic heart disease of native coronary artery with unspecified angina pectoris: Secondary | ICD-10-CM | POA: Diagnosis not present

## 2015-02-09 DIAGNOSIS — E785 Hyperlipidemia, unspecified: Secondary | ICD-10-CM | POA: Diagnosis not present

## 2015-02-09 DIAGNOSIS — Z955 Presence of coronary angioplasty implant and graft: Secondary | ICD-10-CM

## 2015-02-09 DIAGNOSIS — I5030 Unspecified diastolic (congestive) heart failure: Secondary | ICD-10-CM

## 2015-02-09 HISTORY — DX: Unspecified osteoarthritis, unspecified site: M19.90

## 2015-02-09 HISTORY — DX: Atherosclerotic heart disease of native coronary artery without angina pectoris: I25.10

## 2015-02-09 HISTORY — DX: Type 2 diabetes mellitus without complications: E11.9

## 2015-02-09 HISTORY — DX: Gastro-esophageal reflux disease without esophagitis: K21.9

## 2015-02-09 HISTORY — PX: CARDIAC CATHETERIZATION: SHX172

## 2015-02-09 LAB — GLUCOSE, CAPILLARY
GLUCOSE-CAPILLARY: 97 mg/dL (ref 65–99)
GLUCOSE-CAPILLARY: 203 mg/dL — AB (ref 65–99)
Glucose-Capillary: 150 mg/dL — ABNORMAL HIGH (ref 65–99)
Glucose-Capillary: 118 mg/dL — ABNORMAL HIGH (ref 65–99)
Glucose-Capillary: 76 mg/dL (ref 65–99)

## 2015-02-09 LAB — POCT ACTIVATED CLOTTING TIME: ACTIVATED CLOTTING TIME: 638 s

## 2015-02-09 SURGERY — CORONARY STENT INTERVENTION
Anesthesia: LOCAL

## 2015-02-09 MED ORDER — CLOPIDOGREL BISULFATE 300 MG PO TABS
ORAL_TABLET | ORAL | Status: AC
  Administered 2015-02-09: 600 mg via ORAL
  Filled 2015-02-09: qty 2

## 2015-02-09 MED ORDER — INSULIN NPH (HUMAN) (ISOPHANE) 100 UNIT/ML ~~LOC~~ SUSP
20.0000 [IU] | Freq: Two times a day (BID) | SUBCUTANEOUS | Status: DC
  Administered 2015-02-09 – 2015-02-10 (×2): 20 [IU] via SUBCUTANEOUS
  Filled 2015-02-09: qty 10

## 2015-02-09 MED ORDER — SODIUM CHLORIDE 0.9 % WEIGHT BASED INFUSION: 1.0000 mL/kg/h | INTRAVENOUS | Status: DC

## 2015-02-09 MED ORDER — ASPIRIN 81 MG PO CHEW
81.0000 mg | CHEWABLE_TABLET | Freq: Every day | ORAL | Status: DC
  Administered 2015-02-10: 10:00:00 81 mg via ORAL
  Filled 2015-02-09: qty 1

## 2015-02-09 MED ORDER — SODIUM CHLORIDE 0.9 % IV SOLN
INTRAVENOUS | Status: DC | PRN
  Administered 2015-02-09: 200 mL via INTRAVENOUS

## 2015-02-09 MED ORDER — METOPROLOL SUCCINATE ER 25 MG PO TB24: 25.0000 mg | ORAL_TABLET | Freq: Every day | ORAL | Status: DC

## 2015-02-09 MED ORDER — NITROGLYCERIN IN D5W 200-5 MCG/ML-% IV SOLN
INTRAVENOUS | Status: AC
  Filled 2015-02-09: qty 250

## 2015-02-09 MED ORDER — SODIUM CHLORIDE 0.9 % IJ SOLN
3.0000 mL | Freq: Two times a day (BID) | INTRAMUSCULAR | Status: DC
Start: 2015-02-09 — End: 2015-02-09

## 2015-02-09 MED ORDER — SODIUM CHLORIDE 0.9 % IV SOLN: INTRAVENOUS | Status: DC

## 2015-02-09 MED ORDER — SODIUM CHLORIDE 0.9 % IJ SOLN: 3.0000 mL | INTRAMUSCULAR | Status: DC | PRN

## 2015-02-09 MED ORDER — BIVALIRUDIN 250 MG IV SOLR
INTRAVENOUS | Status: AC
  Filled 2015-02-09: qty 250

## 2015-02-09 MED ORDER — SODIUM CHLORIDE 0.9 % IJ SOLN
3.0000 mL | Freq: Two times a day (BID) | INTRAMUSCULAR | Status: DC
  Administered 2015-02-09: 17:00:00 3 mL via INTRAVENOUS

## 2015-02-09 MED ORDER — VERAPAMIL HCL 2.5 MG/ML IV SOLN
INTRAVENOUS | Status: AC
  Filled 2015-02-09: qty 2

## 2015-02-09 MED ORDER — INSULIN LISPRO 100 UNIT/ML CARTRIDGE: 10.0000 [IU] | Freq: Two times a day (BID) | SUBCUTANEOUS | Status: DC

## 2015-02-09 MED ORDER — MIDAZOLAM HCL 2 MG/2ML IJ SOLN
INTRAMUSCULAR | Status: DC | PRN
  Administered 2015-02-09 (×3): 1 mg via INTRAVENOUS

## 2015-02-09 MED ORDER — LIDOCAINE HCL (PF) 1 % IJ SOLN
INTRAMUSCULAR | Status: DC | PRN
  Administered 2015-02-09: 8 mL

## 2015-02-09 MED ORDER — ADULT MULTIVITAMIN W/MINERALS CH
1.0000 | ORAL_TABLET | Freq: Every day | ORAL | Status: DC
  Administered 2015-02-10: 10:00:00 1 via ORAL
  Filled 2015-02-09 (×2): qty 1

## 2015-02-09 MED ORDER — BIVALIRUDIN BOLUS VIA INFUSION - CUPID
INTRAVENOUS | Status: DC | PRN
  Administered 2015-02-09: 58.5 mg via INTRAVENOUS

## 2015-02-09 MED ORDER — SODIUM CHLORIDE 0.9 % IV SOLN: 250.0000 mL | INTRAVENOUS | Status: DC | PRN

## 2015-02-09 MED ORDER — FENTANYL CITRATE (PF) 100 MCG/2ML IJ SOLN
INTRAMUSCULAR | Status: DC | PRN
  Administered 2015-02-09: 25 ug via INTRAVENOUS
  Administered 2015-02-09: 50 ug via INTRAVENOUS

## 2015-02-09 MED ORDER — MIDAZOLAM HCL 2 MG/2ML IJ SOLN
INTRAMUSCULAR | Status: AC
  Filled 2015-02-09: qty 4

## 2015-02-09 MED ORDER — SODIUM CHLORIDE 0.9 % WEIGHT BASED INFUSION
3.0000 mL/kg/h | INTRAVENOUS | Status: DC
  Administered 2015-02-09: 3 mL/kg/h via INTRAVENOUS

## 2015-02-09 MED ORDER — MULTI-VITAMIN/MINERALS PO TABS: 1.0000 | ORAL_TABLET | Freq: Every day | ORAL | Status: DC

## 2015-02-09 MED ORDER — LIDOCAINE HCL (PF) 1 % IJ SOLN
INTRAMUSCULAR | Status: AC
  Filled 2015-02-09: qty 30

## 2015-02-09 MED ORDER — HEPARIN SODIUM (PORCINE) 1000 UNIT/ML IJ SOLN
INTRAMUSCULAR | Status: AC
  Filled 2015-02-09: qty 1

## 2015-02-09 MED ORDER — ACETAMINOPHEN 325 MG PO TABS: 650.0000 mg | ORAL_TABLET | ORAL | Status: DC | PRN

## 2015-02-09 MED ORDER — ATORVASTATIN CALCIUM 20 MG PO TABS
20.0000 mg | ORAL_TABLET | Freq: Every morning | ORAL | Status: DC
  Administered 2015-02-10: 10:00:00 20 mg via ORAL
  Filled 2015-02-09: qty 1

## 2015-02-09 MED ORDER — ONDANSETRON HCL 4 MG/2ML IJ SOLN: 4.0000 mg | Freq: Four times a day (QID) | INTRAMUSCULAR | Status: DC | PRN

## 2015-02-09 MED ORDER — SODIUM CHLORIDE 0.9 % WEIGHT BASED INFUSION: 3.0000 mL/kg/h | INTRAVENOUS | Status: AC

## 2015-02-09 MED ORDER — LIVING WELL WITH DIABETES BOOK
Freq: Once | Status: AC
  Administered 2015-02-09: 20:00:00
  Filled 2015-02-09: qty 1

## 2015-02-09 MED ORDER — INSULIN ASPART 100 UNIT/ML ~~LOC~~ SOLN
10.0000 [IU] | Freq: Two times a day (BID) | SUBCUTANEOUS | Status: DC
  Administered 2015-02-09 – 2015-02-10 (×2): 10 [IU] via SUBCUTANEOUS

## 2015-02-09 MED ORDER — IOHEXOL 350 MG/ML SOLN
INTRAVENOUS | Status: DC | PRN
  Administered 2015-02-09: 100 mL via INTRAVENOUS

## 2015-02-09 MED ORDER — FENTANYL CITRATE (PF) 100 MCG/2ML IJ SOLN
INTRAMUSCULAR | Status: AC
  Filled 2015-02-09: qty 4

## 2015-02-09 MED ORDER — ANGIOPLASTY BOOK
Freq: Once | Status: AC
  Administered 2015-02-09: 20:00:00
  Filled 2015-02-09: qty 1

## 2015-02-09 MED ORDER — ASPIRIN 81 MG PO CHEW: 81.0000 mg | CHEWABLE_TABLET | ORAL | Status: DC

## 2015-02-09 MED ORDER — CLOPIDOGREL BISULFATE 300 MG PO TABS
600.0000 mg | ORAL_TABLET | Freq: Once | ORAL | Status: AC
  Administered 2015-02-09: 600 mg via ORAL

## 2015-02-09 MED ORDER — CLOPIDOGREL BISULFATE 75 MG PO TABS
75.0000 mg | ORAL_TABLET | Freq: Every day | ORAL | Status: DC
  Administered 2015-02-10: 75 mg via ORAL
  Filled 2015-02-09: qty 1

## 2015-02-09 MED ORDER — SODIUM CHLORIDE 0.9 % IV SOLN
250.0000 mg | INTRAVENOUS | Status: DC | PRN
  Administered 2015-02-09: 1.75 mg/kg/h via INTRAVENOUS

## 2015-02-09 MED ORDER — OMEPRAZOLE MAGNESIUM 20 MG PO TBEC: 10.0000 mg | DELAYED_RELEASE_TABLET | Freq: Every day | ORAL | Status: DC

## 2015-02-09 MED ORDER — PANTOPRAZOLE SODIUM 40 MG PO TBEC
40.0000 mg | DELAYED_RELEASE_TABLET | Freq: Every day | ORAL | Status: DC
  Administered 2015-02-10: 40 mg via ORAL
  Filled 2015-02-09: qty 1

## 2015-02-09 SURGICAL SUPPLY — 19 items
ANGIOSEAL 8FR (Vascular Products) ×2 IMPLANT
BALLN ~~LOC~~ EMERGE MR 3.5X8 (BALLOONS) ×1 IMPLANT
BURR ROTALINK 1.50MM (BURR) ×1 IMPLANT
CATH ROTALINK PLUS 1.25MM (BURR) ×1 IMPLANT
CATH S G BIP PACING (SET/KITS/TRAYS/PACK) ×1 IMPLANT
CATH VISTA GUIDE 7FR JR4 (CATHETERS) ×1 IMPLANT
CUTTING BAL FLEXTOME RX2.75X10 (BALLOONS) ×1 IMPLANT
DEVICE CLOSURE ANGIOSEAL 8FR (Vascular Products) IMPLANT
KIT ENCORE 26 ADVANTAGE (KITS) ×2 IMPLANT
KIT HEART LEFT (KITS) ×2 IMPLANT
LUBRICANT ROTAGLIDE 20CC VIAL (MISCELLANEOUS) ×1 IMPLANT
PACK CARDIAC CATHETERIZATION (CUSTOM PROCEDURE TRAY) ×2 IMPLANT
SHEATH PINNACLE 6F 10CM (SHEATH) ×1 IMPLANT
SHEATH PINNACLE 7F 10CM (SHEATH) ×1 IMPLANT
STENT PROMUS PREM MR 3.0X16 (Permanent Stent) ×1 IMPLANT
TRANSDUCER W/STOPCOCK (MISCELLANEOUS) ×2 IMPLANT
TUBING CIL FLEX 10 FLL-RA (TUBING) ×2 IMPLANT
WIRE EMERALD 3MM-J .035X150CM (WIRE) ×1 IMPLANT
WIRE ROTA FLOPPY .009X325CM (WIRE) ×1 IMPLANT

## 2015-02-09 NOTE — Research (Signed)
CAD-LAD Informed Consent   Subject Name: YASIN DUCAT  Subject met inclusion and exclusion criteria.  The informed consent form, study requirements and expectations were reviewed with the subject and questions and concerns were addressed prior to the signing of the consent form.  The subject verbalized understanding of the trail requirements.  The subject agreed to participate in the CAD-LAD trial and signed the informed consent.  The informed consent was obtained prior to performance of any protocol-specific procedures for the subject.  A copy of the signed informed consent was given to the subject and a copy was placed in the subject's medical record.  Hedrick,Wavie Hashimi W 02/09/2015, 0900

## 2015-02-09 NOTE — Interval H&P Note (Signed)
Cath Lab Visit (complete for each Cath Lab visit)  Clinical Evaluation Leading to the Procedure:   ACS: No.  Non-ACS:    Anginal Classification: CCS III  Anti-ischemic medical therapy: Maximal Therapy (2 or more classes of medications)  Non-Invasive Test Results: No non-invasive testing performed  Prior CABG: No previous CABG      History and Physical Interval Note:  02/09/2015 10:09 AM  [name]  has presented today for surgery, with the diagnosis of cad  The various methods of treatment have been discussed with the patient and family. After consideration of risks, benefits and other options for treatment, the patient has consented to  Procedure(s): Coronary Stent Intervention-Rotoblator, temp pacer (N/A) as a surgical intervention .  The patient's history has been reviewed, patient examined, no change in status, stable for surgery.  I have reviewed the patient's chart and labs.  Questions were answered to the patient's satisfaction.     Sinclair Grooms

## 2015-02-09 NOTE — H&P (Signed)
Seth Abbott is a 81 y.o. male  Admit Date: 02/09/2015 Referring Physician: Fransico Him, M.D. Primary Cardiologist:: Fransico Him, M.D. Chief complaint / reason for admission: High-grade proximal RCA stenosis and class III angina pectoris  HPI: 81 year old gentleman, very physically active who is been markedly limited due to exertional dyspnea and chest discomfort. Recent cardiac catheterization performed prior to cataract surgery demonstrated heavily calcified RCA with proximal eccentric 95% stenosis. The left coronary system contains diffuse calcified proximal to distal LAD disease up to 70% at multiple spots. After some discussion we have decided to proceed with intervention on the right coronary as this is clearly a flow-limiting lesion and likely contributes to his symptoms.    PMH:    Past Medical History  Diagnosis Date  . Diabetes   . TIA (transient ischemic attack)   . HTN (hypertension)   . Prostatitis   . Shingles   . Hyperlipidemia   . Dilated aortic root   . Aortic stenosis, mild     PSH:    Past Surgical History  Procedure Laterality Date  . Cataract extraction    . Cardiac catheterization N/A 12/11/2014    Procedure: Left Heart Cath and Coronary Angiography;  Surgeon: Belva Crome, MD;  Location: [facility];  Service: Cardiovascular;  Laterality: N/A;   ALLERGIES:   Review of patient's allergies indicates no known allergies. Prior to Admit Meds:   Prescriptions prior to admission  Medication Sig Dispense Refill Last Dose  . ACCU-CHEK AVIVA PLUS test strip   12 02/09/2015 at Unknown time  . aspirin 81 MG tablet Take 81 mg by mouth every morning.    02/09/2015 at Unknown time  . atorvastatin (LIPITOR) 20 MG tablet Take 20 mg by mouth every morning.    02/09/2015 at Unknown time  . dorzolamide (TRUSOPT) 2 % ophthalmic solution Place 1 drop into both eyes daily.    02/09/2015 at Unknown time  . insulin lispro (HUMALOG) 100 UNIT/ML cartridge Inject 10 Units  into the skin 2 (two) times daily.    02/08/2015 at Unknown time  . insulin NPH Human (HUMULIN N,NOVOLIN N) 100 UNIT/ML injection Inject 20 Units into the skin 2 (two) times daily before a meal.   02/08/2015 at Unknown time  . Multiple Vitamins-Minerals (MULTIVITAMIN WITH MINERALS) tablet Take 1 tablet by mouth daily.   02/08/2015 at Unknown time  . omeprazole (PRILOSEC OTC) 20 MG tablet Take 10 mg by mouth daily.   02/09/2015 at Unknown time  . ramipril (ALTACE) 2.5 MG capsule Take 2.5 mg by mouth daily.  5 02/09/2015 at Unknown time  . metoprolol succinate (TOPROL-XL) 25 MG 24 hr tablet Take 1 tablet (25 mg total) by mouth daily. Take with or immediately following a meal. (Patient not taking: Reported on 02/09/2015) 30 tablet 11 Not Taking at Unknown time   Family HX:    Family History  Problem Relation Age of Onset  . Diabetes Mother   . Heart attack Father   . Diabetes Father   . CAD Father   . Diabetes Sister   . COPD Brother   . Diabetes Sister    Social HX:    Social History   Social History  . Marital Status: Married    Spouse Name: N/A  . Number of Children: N/A  . Years of Education: N/A   Occupational History  . Not on file.   Social History Main Topics  . Smoking status: Never Smoker   .  Smokeless tobacco: Not on file  . Alcohol Use: No  . Drug Use: No  . Sexual Activity: Not on file   Other Topics Concern  . Not on file   Social History Narrative     ROS  Physical Exam: Blood pressure 140/58, pulse 85, temperature 97.6 F (36.4 C), temperature source Oral, resp. rate 16, height 5' 6.5" (1.689 m), weight 78.019 kg (172 lb), SpO2 93 %.    Healthy-appearing elderly gentleman HEENT exam is unremarkable. No pallor is noted. Neck exam reveals a left carotid bruit. No JVD. Chest is clear to auscultation and percussion Abdomen is soft. Bowel sounds are normal. Extremities reveal no edema. Radial pulses femoral pulses and posterior tibial pulses are 2+ and  symmetric. Neurological exam is unremarkable.  Labs: Lab Results  Component Value Date   WBC 5.4 02/05/2015   HGB 14.2 02/05/2015   HCT 42.2 02/05/2015   MCV 90.9 02/05/2015   PLT 218.0 02/05/2015    Recent Labs Lab 02/05/15 0939  NA 134*  K 4.8  CL 101  CO2 26  BUN 22  CREATININE 1.36  CALCIUM 9.4  GLUCOSE 268*   Lab Results  Component Value Date   TROPONINI <0.30 10/12/2013      Radiology:  No results found.  EKG:  Normal sinus rhythm with normal appearance.  ASSESSMENT:  1. Multivessel coronary disease with high-grade calcified proximal RCA and diffuse disease in the LAD and circumflex system. The patient is brought in today for staged coronary intervention with plan rotational atherectomy of the right coronary followed by stenting. Left coronary disease and be treated with medical therapy.  Plan:  The procedure and risks were discussed with the patient and his son. There is risk of emergency coronary bypass grafting. His risk of bleeding and perforation. Furthermore we discussed the risk of limb ischemia, death, stroke, and kidney injury. The patient understands these risks and is willing to proceed. Sinclair Grooms 02/09/2015 10:01 AM

## 2015-02-10 ENCOUNTER — Encounter (HOSPITAL_COMMUNITY): Payer: Self-pay | Admitting: Physician Assistant

## 2015-02-10 DIAGNOSIS — E785 Hyperlipidemia, unspecified: Secondary | ICD-10-CM | POA: Diagnosis not present

## 2015-02-10 DIAGNOSIS — I350 Nonrheumatic aortic (valve) stenosis: Secondary | ICD-10-CM

## 2015-02-10 DIAGNOSIS — I2584 Coronary atherosclerosis due to calcified coronary lesion: Secondary | ICD-10-CM | POA: Diagnosis not present

## 2015-02-10 DIAGNOSIS — E119 Type 2 diabetes mellitus without complications: Secondary | ICD-10-CM | POA: Diagnosis not present

## 2015-02-10 DIAGNOSIS — I25119 Atherosclerotic heart disease of native coronary artery with unspecified angina pectoris: Secondary | ICD-10-CM | POA: Diagnosis not present

## 2015-02-10 DIAGNOSIS — I10 Essential (primary) hypertension: Secondary | ICD-10-CM | POA: Diagnosis not present

## 2015-02-10 DIAGNOSIS — I25118 Atherosclerotic heart disease of native coronary artery with other forms of angina pectoris: Secondary | ICD-10-CM | POA: Diagnosis not present

## 2015-02-10 DIAGNOSIS — Z8673 Personal history of transient ischemic attack (TIA), and cerebral infarction without residual deficits: Secondary | ICD-10-CM | POA: Diagnosis not present

## 2015-02-10 LAB — BASIC METABOLIC PANEL
Anion gap: 9 (ref 5–15)
BUN: 17 mg/dL (ref 6–20)
CO2: 25 mmol/L (ref 22–32)
CREATININE: 0.92 mg/dL (ref 0.61–1.24)
Calcium: 8.8 mg/dL — ABNORMAL LOW (ref 8.9–10.3)
Chloride: 100 mmol/L — ABNORMAL LOW (ref 101–111)
GFR calc Af Amer: >60 mL/min (ref >60)
GLUCOSE: 150 mg/dL — AB (ref 65–99)
POTASSIUM: 4.2 mmol/L (ref 3.5–5.1)
SODIUM: 134 mmol/L — AB (ref 135–145)

## 2015-02-10 LAB — CBC
HEMATOCRIT: 42.5 % (ref 39.0–52.0)
Hemoglobin: 14.2 g/dL (ref 13.0–17.0)
MCH: 30.1 pg (ref 26.0–34.0)
MCHC: 33.4 g/dL (ref 30.0–36.0)
MCV: 90.2 fL (ref 78.0–100.0)
PLATELETS: 165 10*3/uL (ref 150–400)
RBC: 4.71 MIL/uL (ref 4.22–5.81)
RDW: 12.8 % (ref 11.5–15.5)
WBC: 4.8 10*3/uL (ref 4.0–10.5)

## 2015-02-10 LAB — PLATELET INHIBITION P2Y12: Platelet Function  P2Y12: 74 [PRU] — ABNORMAL LOW (ref 194–418)

## 2015-02-10 LAB — GLUCOSE, CAPILLARY: GLUCOSE-CAPILLARY: 136 mg/dL — AB (ref 65–99)

## 2015-02-10 MED ORDER — NITROGLYCERIN 0.4 MG SL SUBL: 0.4000 mg | SUBLINGUAL_TABLET | SUBLINGUAL | Status: AC | PRN

## 2015-02-10 MED ORDER — METOPROLOL SUCCINATE ER 25 MG PO TB24
25.0000 mg | ORAL_TABLET | Freq: Every day | ORAL | Status: DC
  Administered 2015-02-10: 25 mg via ORAL
  Filled 2015-02-10: qty 1

## 2015-02-10 MED ORDER — PANTOPRAZOLE SODIUM 20 MG PO TBEC: 20.0000 mg | DELAYED_RELEASE_TABLET | Freq: Every day | ORAL | Status: DC

## 2015-02-10 MED ORDER — RAMIPRIL 2.5 MG PO CAPS
2.5000 mg | ORAL_CAPSULE | Freq: Every day | ORAL | Status: DC
  Administered 2015-02-10: 10:00:00 2.5 mg via ORAL
  Filled 2015-02-10: qty 1

## 2015-02-10 MED ORDER — CLOPIDOGREL BISULFATE 75 MG PO TABS: 75.0000 mg | ORAL_TABLET | Freq: Every day | ORAL | Status: DC

## 2015-02-10 NOTE — Progress Notes (Signed)
CARDIAC REHAB PHASE I   PRE:  Rate/Rhythm: 109 ST  BP:  Supine:   Sitting: 173/66  Standing:    SaO2:   MODE:  Ambulation: 700 ft   POST:  Rate/Rhythm: 117 ST  BP:  Supine:   Sitting: 192/63, 155/66  Standing:    SaO2:  8638-1771 Pt walked 700 ft with hand held asst. Gait steady. No CP. Stated not quite as SOB as he was prior to procedure. BP elevated but better with second BP. Education completed with pt. Stated sugars normally run around 130s at home. Discussed a few heart healthy food choices and discussed foods high in carbs. Left heart healthy and diabetic diets for pt to read at home as I was giving him a lot of information and did not want to overwhelm. Stressed importance of plavix with stent. Discussed CRP 2 and referring to [place] program.   Graylon Good, RN BSN  02/10/2015 8:51 AM

## 2015-02-10 NOTE — Discharge Summary (Signed)
Discharge Summary   Patient ID: Seth Abbott MRN: [id number], DOB/AGE: 1928-05-08 81 y.o. Admit date: 02/09/2015 D/C date:     02/10/2015  Primary Care Provider: Delrae Rend, MD Primary Cardiologist: Radford Pax  Primary Discharge Diagnoses:  1. CAD with class III angina - recent cath showed 95% prox RCA and diffuse calcified prox to distal LAD disease up to 70% at multiple spots. S/p rotational atherectomy/DES to prox RCA 02/09/15 2. HTN 3. Diabetes mellitus  PMH:  Past Medical History  Diagnosis Date  . TIA (transient ischemic attack) early 1990's; 09/2006    [name] 10/26/2010  . HTN (hypertension)   . Prostatitis   . Shingles   . Hyperlipidemia   . Dilated aortic root   . Aortic stenosis, mild     a. By echo 12/2013.  . Type II diabetes mellitus   . GERD (gastroesophageal reflux disease)   . Arthritis     "mostly in my hands" (02/09/2015)  . CAD (coronary artery disease)     a. 12/2014 cath showed 95% prox RCA and diffuse calcified prox to distal LAD disease up to 70% at multiple spots. S/p rotational atherectomy/DES to prox RCA 02/09/15.    Hospital Course: [name] is an 81 y/o M with history of HTN, HLD, DM, TIA, mild AS by echo 12/2013 who presented to [facility] for planned PCI. He recently was seen in the office due to exertional dyspnea and chest discomfort. Recent cardiac catheterization performed prior to cataract surgery demonstrated heavily calcified RCA with proximal eccentric 95% stenosis. The left coronary system contains diffuse calcified proximal to distal LAD disease up to 70% at multiple spots. He was brought in for planned PCI on the RCA as this was clearly a flow-limiting lesion. He subsequent underwent rotational atherectomy/DES to prox RCA 02/09/15. He tolerated this procedure well. He was started on DAPT with ASA and Plavix. P2Y12 was 74 indicative of adequate platelet inhibition. Earlier this AM he said he didn't feel too good but was unable to state why.  He had not had any cardiac symptoms including CP, SOB, diaphoresis, palpitations. Telemetry, hemodynamics, labs and EKG were stable. He ambulated without difficulty and gradually began to feel better after he ate and walked around. Dr. Irish Lack has seen and examined the patient today and feels he is stable for discharge. Of note the patient was changed to equivalent dose of Protonix this AM as Prilosec was stopped 2/2 Plavix use. He also reported he had recently had stopped Toprol as he thought this was causing some leg pain, but this was resumed as inpatient - it's not clear if the BB was actually causing these symptoms as myalgias are uncommon. May need to consider PVD eval as outpatient. The patient was advised to call his doctor if he has recurrence of these symptoms.    Discharge Vitals: Blood pressure 134/49, pulse 95, temperature 97.8 F (36.6 C), temperature source Oral, resp. rate 19, height 5' 6.5" (1.689 m), weight 181 lb 10.5 oz (82.4 kg), SpO2 98 %.  Labs: Lab Results  Component Value Date   WBC 4.8 02/10/2015   HGB 14.2 02/10/2015   HCT 42.5 02/10/2015   MCV 90.2 02/10/2015   PLT 165 02/10/2015    Recent Labs Lab 02/10/15 0704  NA 134*  K 4.2  CL 100*  CO2 25  BUN 17  CREATININE 0.92  CALCIUM 8.8*  GLUCOSE 150*     Diagnostic Studies/Procedures   Cardiac catheterization this admission, please see full report  and above for summary.   Discharge Medications   Current Discharge Medication List    START taking these medications   Details  clopidogrel (PLAVIX) 75 MG tablet Take 1 tablet (75 mg total) by mouth daily. Qty: 30 tablet, Refills: 11    nitroGLYCERIN (NITROSTAT) 0.4 MG SL tablet Place 1 tablet (0.4 mg total) under the tongue every 5 (five) minutes as needed for chest pain (up to 3 doses). Qty: 25 tablet, Refills: 3    pantoprazole (PROTONIX) 20 MG tablet Take 1 tablet (20 mg total) by mouth daily. Qty: 30 tablet, Refills: 1      CONTINUE these  medications which have NOT CHANGED   Details  aspirin 81 MG tablet Take 81 mg by mouth every morning.     atorvastatin (LIPITOR) 20 MG tablet Take 20 mg by mouth every morning.     dorzolamide (TRUSOPT) 2 % ophthalmic solution Place 1 drop into both eyes daily.     insulin lispro (HUMALOG) 100 UNIT/ML cartridge Inject 10 Units into the skin 2 (two) times daily.     insulin NPH Human (HUMULIN N,NOVOLIN N) 100 UNIT/ML injection Inject 20 Units into the skin 2 (two) times daily before a meal.    Multiple Vitamins-Minerals (MULTIVITAMIN WITH MINERALS) tablet Take 1 tablet by mouth daily.    ramipril (ALTACE) 2.5 MG capsule Take 2.5 mg by mouth daily. Refills: 5    metoprolol succinate (TOPROL-XL) 25 MG 24 hr tablet Take 1 tablet (25 mg total) by mouth daily. Take with or immediately following a meal.      STOP taking these medications     omeprazole (PRILOSEC OTC) 20 MG tablet         Disposition   The patient will be discharged in stable condition to home. Discharge Instructions    Amb Referral to Cardiac Rehabilitation    Complete by:  As directed   Congestive Heart Failure: If diagnosis is Heart Failure, patient MUST meet each of the CMS criteria: 1. Left Ventricular Ejection Fraction </= 35% 2. NYHA class II-IV symptoms despite being on optimal heart failure therapy for at least 6 weeks. 3. Stable = have not had a recent (<6 weeks) or planned (<6 months) major cardiovascular hospitalization or procedure  Program Details: - Physician supervised classes - 1-3 classes per week over a 12-18 week period, generally for a total of 36 sessions  Physician Certification: I certify that the above Cardiac Rehabilitation treatment is medically necessary and is medically approved by me for treatment of this patient. The patient is willing and cooperative, able to ambulate and medically stable to participate in exercise rehabilitation. The participant's progress and Individualized  Treatment Plan will be reviewed by the Medical Director, Cardiac Rehab staff and as indicated by the Referring/Ordering Physician.  Diagnosis:  PCI     Diet - low sodium heart healthy    Complete by:  As directed      Increase activity slowly    Complete by:  As directed   No driving for 2 days. No lifting over 5 lbs for 1 week. No sexual activity for 1 week. Keep procedure site clean & dry. If you notice increased pain, swelling, bleeding or pus, call/return!  You may shower, but no soaking baths/hot tubs/pools for 1 week.   Some studies suggest Prilosec/Omeprazole interacts with Plavix. We changed your Prilosec/Omeprazole to Protonix for less chance of interaction.  Your doctor has resumed your metoprolol. If you develop recurrent symptoms in your legs, call  your doctor.          Follow-up Information    Follow up with [facility] R, NP.   Specialties:  Cardiology, Radiology   Why:  [address] Office - 02/19/15 at 1:30pm   Contact information:   [address] [place] [address] [phone]         Duration of Discharge Encounter: Greater than 30 minutes including physician and PA time.  Signed, Lisbeth Renshaw, [place] PA-C 02/10/2015, 8:59 AM  I have examined the patient and reviewed assessment and plan and discussed with patient.  Agree with above as stated.  Labs OK.  Stressed importance of DAPT.  WOuld recommend that he do cardiac rehab on the future.  VARANASI,JAYADEEP S.

## 2015-02-10 NOTE — Progress Notes (Signed)
  Patient: Seth Abbott / Admit Date: 02/09/2015 / Date of Encounter: 02/10/2015, 6:32 AM   Subjective: Says he doesn't feel too good this morning but is unable to state exactly why. No specific symptoms. No CP, SOB, orthopnea, palpitations. Has ambulated to bathroom without difficulty.   Objective: Telemetry: NSR occ PVCs Physical Exam: Blood pressure 134/49, pulse 80, temperature 97.4 F (36.3 C), temperature source Oral, resp. rate 21, height 5' 6.5" (1.689 m), weight 181 lb 10.5 oz (82.4 kg), SpO2 98 %. General: Well developed, well nourished WM, in no acute distress. Head: Normocephalic, atraumatic, sclera non-icteric, no xanthomas, nares are without discharge. Neck: Negative for carotid bruits. JVP not elevated. Lungs: Clear bilaterally to auscultation without wheezes, rales, or rhonchi. Breathing is unlabored. Heart: RRR S1 S2 without murmurs, rubs, or gallops.  Abdomen: Soft, non-tender, non-distended with normoactive bowel sounds. No rebound/guarding. Extremities: No clubbing or cyanosis. No edema. Distal pedal pulses are 2+ and equal bilaterally. R groin site without hematoma, ecchymosis or bruit Neuro: Alert and oriented X 3. Moves all extremities spontaneously. Psych:  Responds to questions appropriately with a normal affect.   Intake/Output Summary (Last 24 hours) at 02/10/15 9381 Last data filed at 02/09/15 2200  Gross per 24 hour  Intake   1111 ml  Output      0 ml  Net   1111 ml    Inpatient Medications:  . aspirin  81 mg Oral Daily  . atorvastatin  20 mg Oral q morning - 10a  . clopidogrel  75 mg Oral Q breakfast  . insulin aspart  10 Units Subcutaneous BID WC  . insulin NPH Human  20 Units Subcutaneous BID AC  . multivitamin with minerals  1 tablet Oral Daily  . pantoprazole  40 mg Oral Daily  . sodium chloride  3 mL Intravenous Q12H   Infusions:    Labs: None available  Radiology/Studies:  No results found.   Assessment and Plan   1. CAD with class  III angina - recent cath showed 95% prox RCA and diffuse calcified prox to distal LAD disease up to 70% at multiple spots. S/p rotational atherectomy/DES to prox RCA 02/09/15. Continue ASA, Plavix, BB, statin. No labs ordered for this AM - will send for BMET and CBC. Will also send for P2Y12 per Dr. Thompson Caul note. This may not be back this AM but can be followed up as outpatient to determine if he needs to change to Brilinta. Not a candidate for Effient given h/o TIA. He says he does not feel that good this AM but denies any specific complaints. Hopefully once he has breakfast and is seen by cardiac rehab we can send home.  2. HTN, controlled on home regimen - will resume home metoprolol and ramipril off MAR.  3. DM - continue home regimen.  Signed, Melina Copa PA-C Pager: [phone]   I have examined the patient and reviewed assessment and plan and discussed with patient.  Agree with above as stated.  Labs OK.  Stressed importance of DAPT. Cardiac rehab to see.  WOuld recommend that he do cardiac rehab on the future.  Sudie Bandel S.

## 2015-02-11 MED FILL — Verapamil HCl IV Soln 2.5 MG/ML: INTRAVENOUS | Qty: 4 | Status: AC

## 2015-02-11 MED FILL — Nitroglycerin IV Soln 200 MCG/ML in D5W: INTRAVENOUS | Qty: 250 | Status: AC

## 2015-02-19 ENCOUNTER — Encounter: Payer: Self-pay | Admitting: Cardiology

## 2015-02-19 ENCOUNTER — Ambulatory Visit (INDEPENDENT_AMBULATORY_CARE_PROVIDER_SITE_OTHER): Payer: Medicare Other | Admitting: Cardiology

## 2015-02-19 VITALS — BP 102/50 | HR 81 | Ht 67.0 in | Wt 177.5 lb

## 2015-02-19 DIAGNOSIS — E785 Hyperlipidemia, unspecified: Secondary | ICD-10-CM | POA: Diagnosis not present

## 2015-02-19 DIAGNOSIS — I2510 Atherosclerotic heart disease of native coronary artery without angina pectoris: Secondary | ICD-10-CM | POA: Diagnosis not present

## 2015-02-19 DIAGNOSIS — I350 Nonrheumatic aortic (valve) stenosis: Secondary | ICD-10-CM | POA: Diagnosis not present

## 2015-02-19 DIAGNOSIS — I25118 Atherosclerotic heart disease of native coronary artery with other forms of angina pectoris: Secondary | ICD-10-CM | POA: Diagnosis not present

## 2015-02-19 DIAGNOSIS — I10 Essential (primary) hypertension: Secondary | ICD-10-CM | POA: Diagnosis not present

## 2015-02-19 NOTE — Patient Instructions (Signed)
Medication Instructions:  Your physician recommends that you continue on your current medications as directed. Please refer to the Current Medication list given to you today.   Labwork: None ordered  Testing/Procedures: None ordered  Follow-Up: Your physician wants you to follow-up in: [place].

## 2015-02-19 NOTE — Progress Notes (Signed)
Cardiology Office Note   Date:  02/19/2015   ID:  Seth Abbott, DOB [birth year], MRN [id number]  PCP:  Delrae Rend, MD  Cardiologist:  [name]    Chief Complaint  Patient presents with  . Hospitalization Follow-up    s/p stent      History of Present Illness: Seth Abbott is a 81 y.o. male who presents for hospital follow up after stent placement.   He has a history of HTN, HLD, DM, TIA, mild AS by echo 12/2013 who presented to [facility] for planned PCI. He recently was seen in the office due to exertional dyspnea and chest discomfort. Recent cardiac catheterization performed prior to cataract surgery demonstrated heavily calcified RCA with proximal eccentric 95% stenosis. The left coronary system contains diffuse calcified proximal to distal LAD disease up to 70% at multiple spots. He was brought in for planned PCI on the RCA as this was clearly a flow-limiting lesion. He subsequent underwent rotational atherectomy/DES to prox RCA 02/09/15. He did well and was discharged the next day on ASA, plavix and BB, statin and ACE.   He did complain of leg weakness in mornings, he does have a history of back pain  He has no more chest pain or SOB.  No palpitations.  No complaints today.  Past Medical History  Diagnosis Date  . TIA (transient ischemic attack) early 1990's; 09/2006    [name] 10/26/2010  . HTN (hypertension)   . Prostatitis   . Shingles   . Hyperlipidemia   . Dilated aortic root   . Aortic stenosis, mild     a. By echo 12/2013.  . Type II diabetes mellitus   . GERD (gastroesophageal reflux disease)   . Arthritis     "mostly in my hands" (02/09/2015)  . CAD (coronary artery disease)     a. 12/2014 cath showed 95% prox RCA and diffuse calcified prox to distal LAD disease up to 70% at multiple spots. S/p rotational atherectomy/DES to prox RCA 02/09/15.    Past Surgical History  Procedure Laterality Date  . Cataract extraction w/ intraocular lens  implant,  bilateral Bilateral   . Cardiac catheterization N/A 12/11/2014    Procedure: Left Heart Cath and Coronary Angiography;  Surgeon: Belva Crome, MD;  Location: [place] [facility];  Service: Cardiovascular;  Laterality: N/A;  . Cardiac catheterization N/A 02/09/2015    Procedure: Coronary Stent Intervention-Rotoblator, temp pacer;  Surgeon: Belva Crome, MD;  Location: [place] [facility];  Service: Cardiovascular;  Laterality: N/A;  . Excisional hemorrhoidectomy  1957  . Eye surgery    . Membrane peel Left 01/11/2015    eye     Current Outpatient Prescriptions  Medication Sig Dispense Refill  . aspirin 81 MG tablet Take 81 mg by mouth every morning.     [name] atorvastatin (LIPITOR) 20 MG tablet Take 20 mg by mouth every morning.     . clopidogrel (PLAVIX) 75 MG tablet Take 1 tablet (75 mg total) by mouth daily. 30 tablet 11  . insulin lispro (HUMALOG) 100 UNIT/ML cartridge Inject 10 Units into the skin 2 (two) times daily.     . insulin NPH Human (HUMULIN N,NOVOLIN N) 100 UNIT/ML injection Inject 20 Units into the skin 2 (two) times daily before a meal.    . metoprolol succinate (TOPROL-XL) 25 MG 24 hr tablet Take 1 tablet (25 mg total) by mouth daily. Take with or immediately following a meal. 30 tablet 11  .  Multiple Vitamins-Minerals (MULTIVITAMIN WITH MINERALS) tablet Take 1 tablet by mouth daily.    . nitroGLYCERIN (NITROSTAT) 0.4 MG SL tablet Place 1 tablet (0.4 mg total) under the tongue every 5 (five) minutes as needed for chest pain (up to 3 doses). 25 tablet 3  . pantoprazole (PROTONIX) 20 MG tablet Take 1 tablet (20 mg total) by mouth daily. 30 tablet 1  . ramipril (ALTACE) 2.5 MG capsule Take 2.5 mg by mouth daily.  5   No current facility-administered medications for this visit.    Allergies:   Review of patient's allergies indicates no known allergies.    Social History:  The patient  reports that he has quit smoking. He has never used smokeless tobacco. He reports that he drinks  alcohol. He reports that he does not use illicit drugs.   Family History:  The patient's family history includes CAD in his father; COPD in his brother; Diabetes in his father, mother, sister, and sister; Heart attack in his father.    ROS:  General:no colds or fevers, no weight changes Skin:no rashes or ulcers HEENT:no blurred vision, no congestion CV:see HPI PUL:see HPI GI:no diarrhea constipation or melena, no indigestion GU:no hematuria, no dysuria MS:no joint pain, no claudication Neuro:no syncope, no lightheadedness Endo:+ diabetes glucose ranges about 170, no thyroid disease  Wt Readings from Last 3 Encounters:  02/19/15 177 lb 8 oz (80.513 kg)  02/10/15 181 lb 10.5 oz (82.4 kg)  12/11/14 172 lb (78.019 kg)     PHYSICAL EXAM: VS:  BP 102/50 mmHg  Pulse 81  Ht '5\' 7"'  (1.702 m)  Wt 177 lb 8 oz (80.513 kg)  BMI 27.79 kg/m2 , BMI Body mass index is 27.79 kg/(m^2). General:Pleasant affect, NAD Skin:Warm and dry, brisk capillary refill HEENT:normocephalic, sclera clear, mucus membranes moist Neck:supple, no JVD, no bruits  Heart:S1S2 RRR with 3-7/4 systolic murmur, no gallup, rub or click Lungs:clear without rales, rhonchi, or wheezes MOL:MBEM, non tender, + BS, do not palpate liver spleen or masses Ext:no lower ext edema, 2+ pedal pulses, 2+ radial pulses Neuro:alert and oriented X 3, MAE, follows commands, + facial symmetry    EKG:  EKG is ordered today. The ekg ordered today demonstrates NSR, LAD, no acute changes    Recent Labs: 02/10/2015: BUN 17; Creatinine, Ser 0.92; Hemoglobin 14.2; Platelets 165; Potassium 4.2; Sodium 134*    Lipid Panel No results found for: CHOL, TRIG, HDL, CHOLHDL, VLDL, LDLCALC, LDLDIRECT     Other studies Reviewed: Additional studies/ records that were reviewed today include:.  Successful rotational atherectomy followed by DE stenting of the proximal right coronary from 90% to 0% with TIMI grade 3 flow     Cardiac  cath: Dominance: Co-dominant   Left Main   . LM lesion, 40% stenosed. calcified .     Left Anterior Descending   . Ost LAD to Dist LAD lesion, 50% stenosed. The lesion is type C calcified eccentric .   [name] Dist LAD lesion, 70% stenosed. The lesion is type C calcified eccentric .   [name] Third Diagonal Branch   The vessel is small in size.     Left Circumflex   . Prox Cx to Dist Cx lesion, 20% stenosed. calcified .     Right Coronary Artery   . Prox RCA lesion, 85% stenosed. The lesion is type C calcified eccentric . The lesion was not previously treated.   . Angioplasty: A drug-eluting stent was placed. A post-stent angioplasty was not performed. Maximum pressure:  14 atm. The pre-interventional distal flow is normal (TIMI 3). The post-interventional distal flow is normal (TIMI 3). The intervention was successful. No complications occured at this lesion. IC nitroglycerin was given. Rotational atherectomy was performed. 3 runs using a 1.25 mm burr. We then upgraded to a 1.5 mm burr and did another 2 runs. Mild hypotension was noted during rotational atherectomy. During each run the temperature pacemaker was required for heart rate slowing. After successful rotational atherectomy we then use cutting balloon followed by stenting using DES.  [name] Atherectomy: Rotational atherectomy was performed. The intervention was successful.  [name] PCI: No pre-stent angioplasty was performed. A drug-eluting stent was placed. The strut is apposed. Post-stent angioplasty was performed.  . There is no residual stenosis post intervention.     . Prox RCA to Dist RCA lesion, 40% stenosed. calcified eccentric .         ASSESSMENT AND PLAN:  1. CAD with class III angina - recent cath showed 95% prox RCA and diffuse calcified prox to distal LAD disease up to 70% at multiple spots. S/p rotational atherectomy/DES to prox RCA 02/09/15 no pain since discharge. No SOB  Follow up with [name] in 3 months. Continue current  meds. 2. HTN- controlled, no lightheadedness  3. Diabetes mellitus- running a little high will follow  Up with his PCP 4. Hyperlipidemia last lipids in feb with LDL 70, followed by PCP. 5. AS stable.   Current medicines are reviewed with the patient today.  The patient Has no concerns regarding medicines.  The following changes have been made:  See above Labs/ tests ordered today include:see above  Disposition:   FU:  see above  Signed, Isaiah Serge, NP  02/19/2015 1:58 PM    [facility] [place], [place], [place], [place] Phone: [phone]; Fax: [phone]

## 2015-03-17 DIAGNOSIS — Z23 Encounter for immunization: Secondary | ICD-10-CM | POA: Diagnosis not present

## 2015-03-25 DIAGNOSIS — H35372 Puckering of macula, left eye: Secondary | ICD-10-CM | POA: Diagnosis not present

## 2015-04-01 DIAGNOSIS — Z1389 Encounter for screening for other disorder: Secondary | ICD-10-CM | POA: Diagnosis not present

## 2015-04-01 DIAGNOSIS — I2510 Atherosclerotic heart disease of native coronary artery without angina pectoris: Secondary | ICD-10-CM | POA: Diagnosis not present

## 2015-04-01 DIAGNOSIS — I10 Essential (primary) hypertension: Secondary | ICD-10-CM | POA: Diagnosis not present

## 2015-04-01 DIAGNOSIS — E785 Hyperlipidemia, unspecified: Secondary | ICD-10-CM | POA: Diagnosis not present

## 2015-04-01 DIAGNOSIS — I350 Nonrheumatic aortic (valve) stenosis: Secondary | ICD-10-CM | POA: Diagnosis not present

## 2015-04-01 DIAGNOSIS — Z794 Long term (current) use of insulin: Secondary | ICD-10-CM | POA: Diagnosis not present

## 2015-04-01 DIAGNOSIS — Z0000 Encounter for general adult medical examination without abnormal findings: Secondary | ICD-10-CM | POA: Diagnosis not present

## 2015-04-01 DIAGNOSIS — E119 Type 2 diabetes mellitus without complications: Secondary | ICD-10-CM | POA: Diagnosis not present

## 2015-04-04 ENCOUNTER — Other Ambulatory Visit: Payer: Self-pay | Admitting: Physician Assistant

## 2015-05-20 DIAGNOSIS — H35372 Puckering of macula, left eye: Secondary | ICD-10-CM | POA: Diagnosis not present

## 2015-05-21 ENCOUNTER — Encounter: Payer: Medicare Other | Admitting: Cardiology

## 2015-05-21 NOTE — Progress Notes (Signed)
This encounter was created in error - please disregard.

## 2015-06-11 ENCOUNTER — Ambulatory Visit (INDEPENDENT_AMBULATORY_CARE_PROVIDER_SITE_OTHER): Payer: Medicare Other | Admitting: Cardiology

## 2015-06-11 ENCOUNTER — Encounter: Payer: Self-pay | Admitting: Cardiology

## 2015-06-11 VITALS — BP 150/62 | HR 76 | Ht 64.0 in | Wt 175.0 lb

## 2015-06-11 DIAGNOSIS — I2510 Atherosclerotic heart disease of native coronary artery without angina pectoris: Secondary | ICD-10-CM

## 2015-06-11 DIAGNOSIS — I77810 Thoracic aortic ectasia: Secondary | ICD-10-CM

## 2015-06-11 DIAGNOSIS — F329 Major depressive disorder, single episode, unspecified: Secondary | ICD-10-CM

## 2015-06-11 DIAGNOSIS — F32A Depression, unspecified: Secondary | ICD-10-CM

## 2015-06-11 DIAGNOSIS — I10 Essential (primary) hypertension: Secondary | ICD-10-CM

## 2015-06-11 DIAGNOSIS — I350 Nonrheumatic aortic (valve) stenosis: Secondary | ICD-10-CM

## 2015-06-11 DIAGNOSIS — E785 Hyperlipidemia, unspecified: Secondary | ICD-10-CM

## 2015-06-11 NOTE — Patient Instructions (Addendum)
Medication Instructions:  Your physician recommends that you continue on your current medications as directed. Please refer to the Current Medication list given to you today.   Labwork: 06/2015 CMET, LIPID; SAME DAY AS ECHO  Testing/Procedures: Your physician has requested that you have an echocardiogram. Echocardiography is a painless test that uses sound waves to create images of your heart. It provides your doctor with information about the size and shape of your heart and how well your heart's chambers and valves are working. This procedure takes approximately one hour. There are no restrictions for this procedure.   Follow-Up: 3 MONTHS WITH [name]  Any Other Special Instructions Will Be Listed Below (If Applicable).   If you need a refill on your cardiac medications before your next appointment, please call your pharmacy.

## 2015-06-11 NOTE — Progress Notes (Signed)
Cardiology Office Note   Date:  06/11/2015   ID:  Seth Abbott, DOB Dec 11, 1927, MRN [id number]  PCP:  Seth Rend, MD  Cardiologist:  Dr. Radford Abbott    Chief Complaint  Patient presents with  . Coronary Artery Disease    post CATH with Intervention//pt c/o mild chest discomfort occasionally, SOB on exertion.      History of Present Illness: Seth Abbott is a 81 y.o. male who presents for CAD FOLLOW UP.    He has a history of HTN, HLD, DM, TIA, mild AS by echo 12/2013 who presented to [facility] for planned PCI. He recently was seen in the office due to exertional dyspnea and chest discomfort. Recent cardiac catheterization performed prior to cataract surgery demonstrated heavily calcified RCA with proximal eccentric 95% stenosis. The left coronary system contains diffuse calcified proximal to distal LAD disease up to 70% at multiple spots. He was brought in for planned PCI on the RCA as this was clearly a flow-limiting lesion. He subsequent underwent rotational atherectomy/DES to prox RCA 02/09/15.  Today he has mild chest discomfort last seconds and is rare. No NTG also occ SOB again mild.  He has not gotten strength back from Stent. Procedure.  He also admits to depression.  He had been on zoloft a year ago and felt weird in the head, he had script for 25 mg- he would like to try again, I asked him to only take every other day and see how he did but to call his PCP for another medication if problems occur.       Past Medical History  Diagnosis Date  . TIA (transient ischemic attack) early 1990's; 09/2006    Seth Abbott 10/26/2010  . HTN (hypertension)   . Prostatitis   . Shingles   . Hyperlipidemia   . Dilated aortic root ([place])   . Aortic stenosis, mild     a. By echo 12/2013.  . Type II diabetes mellitus ([place])   . GERD (gastroesophageal reflux disease)   . Arthritis     "mostly in my hands" (02/09/2015)  . CAD (coronary artery disease)     a. 12/2014 cath showed 95%  prox RCA and diffuse calcified prox to distal LAD disease up to 70% at multiple spots. S/p rotational atherectomy/DES to prox RCA 02/09/15.    Past Surgical History  Procedure Laterality Date  . Cataract extraction w/ intraocular lens  implant, bilateral Bilateral   . Cardiac catheterization N/A 12/11/2014    Procedure: Left Heart Cath and Coronary Angiography;  Surgeon: Seth Crome, MD;  Location: [place] [facility];  Service: Cardiovascular;  Laterality: N/A;  . Cardiac catheterization N/A 02/09/2015    Procedure: Coronary Stent Intervention-Rotoblator, temp pacer;  Surgeon: Seth Crome, MD;  Location: [place] [facility];  Service: Cardiovascular;  Laterality: N/A;  . Excisional hemorrhoidectomy  1957  . Eye surgery    . Membrane peel Left 01/11/2015    eye     Current Outpatient Prescriptions  Medication Sig Dispense Refill  . aspirin 81 MG tablet Take 81 mg by mouth every morning.     [name] atorvastatin (LIPITOR) 20 MG tablet Take 20 mg by mouth every morning.     . clopidogrel (PLAVIX) 75 MG tablet Take 1 tablet (75 mg total) by mouth daily. 30 tablet 11  . insulin lispro (HUMALOG) 100 UNIT/ML cartridge Inject 10 Units into the skin 2 (two) times daily.     . insulin NPH Human (  HUMULIN N,NOVOLIN N) 100 UNIT/ML injection Inject 20 Units into the skin 2 (two) times daily before a meal.    . metoprolol succinate (TOPROL-XL) 25 MG 24 hr tablet Take 1 tablet (25 mg total) by mouth daily. Take with or immediately following a meal. 30 tablet 11  . Multiple Vitamins-Minerals (MULTIVITAMIN WITH MINERALS) tablet Take 1 tablet by mouth daily.    . nitroGLYCERIN (NITROSTAT) 0.4 MG SL tablet Place 1 tablet (0.4 mg total) under the tongue every 5 (five) minutes as needed for chest pain (up to 3 doses). 25 tablet 3  . pantoprazole (PROTONIX) 20 MG tablet TAKE 1 TABLET (20 MG TOTAL) BY MOUTH DAILY. 30 tablet 2  . ramipril (ALTACE) 2.5 MG capsule Take 2.5 mg by mouth daily.  5   No current  facility-administered medications for this visit.    Allergies:   Review of patient's allergies indicates no known allergies.    Social History:  The patient  reports that he has quit smoking. He has never used smokeless tobacco. He reports that he drinks alcohol. He reports that he does not use illicit drugs.   Family History:  The patient's family history includes CAD in his father; COPD in his brother; Diabetes in his father, mother, sister, and sister; Heart attack in his father.    ROS:  General:no colds or fevers, some weight decrease Skin:no rashes or ulcers HEENT:no blurred vision, no congestion CV:see HPI PUL:see HPI GI:no diarrhea constipation or melena, no indigestion GU:no hematuria, no dysuria MS:no joint pain, no claudication Neuro:no syncope, no lightheadedness Abbott:+ diabetes-followed by Dr. Buddy Abbott. , no thyroid disease Psych:  Admits to depression- misses his wife of 67 years, she died 2 years ago.    Wt Readings from Last 3 Encounters:  06/11/15 175 lb (79.379 kg)  02/19/15 177 lb 8 oz (80.513 kg)  02/10/15 181 lb 10.5 oz (82.4 kg)     PHYSICAL EXAM: VS:  BP 150/62 mmHg  Pulse 76  Ht 5\' 4"  (1.626 m)  Wt 175 lb (79.379 kg)  BMI 30.02 kg/m2 , BMI Body mass index is 30.02 kg/(m^2). General:Pleasant affect, NAD Skin:Warm and dry, brisk capillary refill HEENT:normocephalic, sclera clear, mucus membranes moist Neck:supple, no JVD, no bruits  Heart:S1S2 RRR with [id number] systolic ASt murmur, no gallup, rub or click Lungs:clear without rales, rhonchi, or wheezes [id number], non tender, + BS, do not palpate liver spleen or masses Ext:no lower ext edema, 2+ pedal pulses, 2+ radial pulses Neuro:alert and oriented X 3, MAE, follows commands, + facial symmetry    EKG:  EKG is ordered today. The ekg ordered today demonstrates SR no changes from previous tracing in Sept.    Recent Labs: 02/10/2015: BUN 17; Creatinine, Ser 0.92; Hemoglobin 14.2; Platelets 165; Potassium  4.2; Sodium 134*    Lipid Panel No results found for: CHOL, TRIG, HDL, CHOLHDL, VLDL, LDLCALC, LDLDIRECT     Other studies Reviewed: Additional studies/ records that were reviewed today include:  cardiac cath:  Prox RCA lesion, 85% stenosed. The lesion was not previously treated.  Prox RCA to Dist RCA lesion, 40% stenosed.  LM lesion, 40% stenosed.  Ost LAD to Dist LAD lesion, 50% stenosed.  Mid LAD lesion, 70% stenosed.  Prox Cx to Dist Cx lesion, 20% stenosed.   The patient has severe coronary calcification is noted by CT and JL and 70 fluoroscopy.  Angiographically significant proximal heavily calcified 85% RCA stenosis.  Diffuse left main and proximal LAD coronary disease with heavy calcification but  no focally significant obstruction.  Mid LAD 70% stenosis within the heavily calcified segment beyond a large first diagonal branch.  The circumflex coronary artery is widely patent but heavily calcified  Normal left ventricular function   RECOMMENDATIONS:  Begin metoprolol succinate 25 mg daily   Continue aspirin.   Later Successful rotational atherectomy followed by DE stenting of the proximal right coronary from 90% to 0% with TIMI grade 3 flow 02/08/14    ASSESSMENT AND PLAN:  1.  CAD with stent to RCA placed 02/09/15- mild chest discomfort at times. But very brief has not had to use NTG.  He will call if more freq. Episodes.   2. AS  Will check echo with mild SOB  3. Depression will resume his zoloft but only every other day.  Encouraged to follow up soon with PCP for eval.  4. Hyperlipidemia will check lipids and CMP in Jan   5. HTN mildly elevated today but he took aleve may have caused increase.    Current medicines are reviewed with the patient today.  The patient Has no concerns regarding medicines.  The following changes have been made:  See above Labs/ tests ordered today include:see above  Disposition:   FU:  see  above  Signed, Isaiah Serge, NP  06/11/2015 3:07 PM    [facility] [place], [place], [place] [place], [place] Phone: [phone]; Fax: [phone]

## 2015-06-15 ENCOUNTER — Other Ambulatory Visit: Payer: Self-pay | Admitting: Cardiology

## 2015-06-23 ENCOUNTER — Other Ambulatory Visit (INDEPENDENT_AMBULATORY_CARE_PROVIDER_SITE_OTHER): Payer: Medicare Other | Admitting: *Deleted

## 2015-06-23 ENCOUNTER — Other Ambulatory Visit: Payer: Self-pay

## 2015-06-23 ENCOUNTER — Ambulatory Visit (HOSPITAL_COMMUNITY): Payer: Medicare Other | Attending: Cardiovascular Disease

## 2015-06-23 DIAGNOSIS — Z87891 Personal history of nicotine dependence: Secondary | ICD-10-CM | POA: Diagnosis not present

## 2015-06-23 DIAGNOSIS — E785 Hyperlipidemia, unspecified: Secondary | ICD-10-CM

## 2015-06-23 DIAGNOSIS — E119 Type 2 diabetes mellitus without complications: Secondary | ICD-10-CM | POA: Diagnosis not present

## 2015-06-23 DIAGNOSIS — Z8249 Family history of ischemic heart disease and other diseases of the circulatory system: Secondary | ICD-10-CM | POA: Diagnosis not present

## 2015-06-23 DIAGNOSIS — I10 Essential (primary) hypertension: Secondary | ICD-10-CM | POA: Diagnosis not present

## 2015-06-23 DIAGNOSIS — I350 Nonrheumatic aortic (valve) stenosis: Secondary | ICD-10-CM | POA: Diagnosis not present

## 2015-06-23 LAB — COMPREHENSIVE METABOLIC PANEL
ALK PHOS: 76 U/L (ref 40–115)
ALT: 22 U/L (ref 9–46)
AST: 23 U/L (ref 10–35)
Albumin: 4 g/dL (ref 3.6–5.1)
BILIRUBIN TOTAL: 0.4 mg/dL (ref 0.2–1.2)
BUN: 19 mg/dL (ref 7–25)
CALCIUM: 9.1 mg/dL (ref 8.6–10.3)
CO2: 23 mmol/L (ref 20–31)
Chloride: 105 mmol/L (ref 98–110)
Creat: 1.01 mg/dL (ref 0.70–1.11)
GLUCOSE: 160 mg/dL — AB (ref 65–99)
Potassium: 4.3 mmol/L (ref 3.5–5.3)
Sodium: 137 mmol/L (ref 135–146)
Total Protein: 6.6 g/dL (ref 6.1–8.1)

## 2015-06-23 LAB — LIPID PANEL
Cholesterol: 132 mg/dL (ref 125–200)
HDL: 57 mg/dL (ref >=40)
LDL Cholesterol: 61 mg/dL (ref <130)
Total CHOL/HDL Ratio: 2.3 Ratio (ref <=5.0)
Triglycerides: 68 mg/dL (ref <150)
VLDL: 14 mg/dL (ref <30)

## 2015-06-24 ENCOUNTER — Other Ambulatory Visit: Payer: Self-pay | Admitting: *Deleted

## 2015-06-24 ENCOUNTER — Telehealth: Payer: Self-pay | Admitting: *Deleted

## 2015-06-24 DIAGNOSIS — R0602 Shortness of breath: Secondary | ICD-10-CM

## 2015-06-24 MED ORDER — ISOSORBIDE MONONITRATE ER 30 MG PO TB24: 15.0000 mg | ORAL_TABLET | Freq: Every day | ORAL | Status: DC

## 2015-06-24 NOTE — Telephone Encounter (Signed)
Per Cecilie Kicks, NP, called pt regarding his ECHO and his complaints of SOB.  Mickel Baas spoke with [name] and they both agreed for the pt to get BNP, PFT, and start on Imdur 30 mg taking 1/2 tablet daily.  Pt has been advised of this and has agreed when they call him to schedule for the PFT that he will come by the office on the same day and get the lab.  Imdur has been called to [facility] on [address].  Pt verbalized understanding.

## 2015-06-29 ENCOUNTER — Ambulatory Visit (HOSPITAL_COMMUNITY)
Admission: RE | Admit: 2015-06-29 | Discharge: 2015-06-29 | Disposition: A | Discharge status: Home / Self Care | Payer: Medicare Other | Source: Ambulatory Visit | Attending: Cardiology | Admitting: Cardiology

## 2015-06-29 DIAGNOSIS — R0602 Shortness of breath: Secondary | ICD-10-CM | POA: Diagnosis not present

## 2015-06-29 LAB — PULMONARY FUNCTION TEST
DL/VA % PRED: 85 %
DL/VA: 3.61 ml/min/mmHg/L
DLCO UNC % PRED: 69 %
DLCO UNC: 17.77 ml/min/mmHg
FEF 25-75 POST: 1.35 L/s
FEF 25-75 Pre: 1.98 L/sec
FEF2575-%Change-Post: -32 %
FEF2575-%PRED-POST: 105 %
FEF2575-%Pred-Pre: 156 %
FEV1-%Change-Post: -36 %
FEV1-%PRED-PRE: 121 %
FEV1-%Pred-Post: 77 %
FEV1-Post: 1.57 L
FEV1-Pre: 2.48 L
FEV1FVC-%Change-Post: -40 %
FEV1FVC-%PRED-PRE: 111 %
FEV6-%CHANGE-POST: 0 %
FEV6-%PRED-POST: 116 %
FEV6-%Pred-Pre: 115 %
FEV6-POST: 3.19 L
FEV6-Pre: 3.16 L
FEV6FVC-%PRED-POST: 109 %
FEV6FVC-%Pred-Pre: 109 %
FVC-%Change-Post: 5 %
FVC-%PRED-POST: 112 %
FVC-%Pred-Pre: 105 %
FVC-POST: 3.35 L
FVC-Pre: 3.16 L
POST FEV1/FVC RATIO: 47 %
PRE FEV1/FVC RATIO: 78 %
Post FEV6/FVC ratio: 100 %
Pre FEV6/FVC Ratio: 100 %
RV % pred: 289 %
RV: 7.21 L
TLC % pred: 171 %
TLC: 10.38 L

## 2015-06-29 MED ORDER — ALBUTEROL SULFATE (2.5 MG/3ML) 0.083% IN NEBU
2.5000 mg | INHALATION_SOLUTION | Freq: Once | RESPIRATORY_TRACT | Status: AC
  Administered 2015-06-29: 2.5 mg via RESPIRATORY_TRACT

## 2015-07-07 ENCOUNTER — Telehealth: Payer: Self-pay | Admitting: Cardiology

## 2015-07-07 DIAGNOSIS — R942 Abnormal results of pulmonary function studies: Secondary | ICD-10-CM

## 2015-07-07 NOTE — Telephone Encounter (Signed)
-----   Message from Sueanne Margarita, MD sent at 07/01/2015 10:37 AM EST ----- Please refer to Dr. Chase Caller for abnormal PFTs

## 2015-07-07 NOTE — Telephone Encounter (Signed)
Informed patient of results and verbal understanding expressed.  Dr. Chase Caller referral placed. Patient agrees with treatment plan.

## 2015-07-07 NOTE — Telephone Encounter (Signed)
New problem ° ° °Pt returning your call, °

## 2015-07-09 ENCOUNTER — Telehealth: Payer: Self-pay | Admitting: Cardiology

## 2015-07-09 NOTE — Telephone Encounter (Signed)
New message      Calling to talk to [facility].  There was a message on his vm----not sure if it is an old message or a new message

## 2015-07-09 NOTE — Telephone Encounter (Signed)
Informed patient that the message must be old - all results have been called and instructions given. Instructed patient to call back with further questions or concerns.

## 2015-08-10 DIAGNOSIS — E1165 Type 2 diabetes mellitus with hyperglycemia: Secondary | ICD-10-CM | POA: Diagnosis not present

## 2015-08-10 DIAGNOSIS — I10 Essential (primary) hypertension: Secondary | ICD-10-CM | POA: Diagnosis not present

## 2015-08-10 DIAGNOSIS — Z125 Encounter for screening for malignant neoplasm of prostate: Secondary | ICD-10-CM | POA: Diagnosis not present

## 2015-08-10 DIAGNOSIS — Z794 Long term (current) use of insulin: Secondary | ICD-10-CM | POA: Diagnosis not present

## 2015-08-16 ENCOUNTER — Ambulatory Visit (INDEPENDENT_AMBULATORY_CARE_PROVIDER_SITE_OTHER): Payer: Medicare Other | Admitting: Internal Medicine

## 2015-08-16 ENCOUNTER — Encounter: Payer: Self-pay | Admitting: Internal Medicine

## 2015-08-16 VITALS — BP 146/60 | HR 81 | Ht 66.0 in | Wt 181.0 lb

## 2015-08-16 DIAGNOSIS — R0600 Dyspnea, unspecified: Secondary | ICD-10-CM | POA: Diagnosis not present

## 2015-08-16 DIAGNOSIS — R0602 Shortness of breath: Secondary | ICD-10-CM | POA: Insufficient documentation

## 2015-08-16 DIAGNOSIS — R942 Abnormal results of pulmonary function studies: Secondary | ICD-10-CM | POA: Diagnosis not present

## 2015-08-16 DIAGNOSIS — R0689 Other abnormalities of breathing: Secondary | ICD-10-CM | POA: Diagnosis not present

## 2015-08-16 NOTE — Patient Instructions (Signed)
ICD-9-CM ICD-10-CM   1. Dyspnea and respiratory abnormality 786.09 R06.00     R06.89   2. Abnormal PFTs (pulmonary function tests) 794.2 R94.2     Do high resolution CT chest without contrast supine and prone images We will call you with the results to decide the next step which could potentially involve physical reconditioning versus pulmonary stress test on a bike

## 2015-08-16 NOTE — Progress Notes (Signed)
Subjective:    Patient ID: Seth Abbott, male    DOB: 08/10/1927, 82 y.o.   MRN: [id number] PCP KERR,JEFFREY, MD ' HPI  IOV 08/16/2015  Chief Complaint  Patient presents with  . Pulmonary Consult    Pt referred by [name] for abnormal PFT. Pt c/o mild DOE x 2 years, prod cough with clear mucus x 3 weeks. Pt denies CP/tightness.    82 year old male accompanied by his son. History provided by him and his son and review of the chart. Insidious onset of shortness of breath for the last 2 years. In fall 2016 he had a cardiac stent placed according to his history and since then the dyspnea is worse. He believes this is due to physical deconditioning and him being more sedentary post stent placement. He is not on Brilinta that is associated with dyspnea in's cardiac stent patients.   There is no associated smoking history occupational lung disease exposure history. Symptoms are progressive and rated as moderate in intensity. For the last few weeks he's even had a cough. This no fever or chills or edema or hemoptysis.  Full pulmonary function test 06/29/2015 FVC 3.16 L/105%. FEV1 2.48 L/121%. Ratio 78. No bronchodilator rest months. Total lung capacity 10.38/171%. DLCO 17.77/69%. Overall normal except for isolated reduction in diffusion capacity. Personally visualized the trace  He has had 2 CT scan chest 1 in May 2015 and another one in June 2016. Both are vascular studies. In both there is no comment about lung parenchyma but in my personal opinion visualizing the image there is possibility that he has subtle  subpleural interstitial abnormalitiies   echocardiogram 06/23/2015: Normal ejection fraction with grade 1 diastolic dysfunction    blood lab work 06/23/2015 creatinine 1.01 mg percent and in August 2016 hemoglobin was 14.6 g percent.    has a past medical history of TIA (transient ischemic attack) (early 1990's; 09/2006); HTN (hypertension); Prostatitis; Shingles; Hyperlipidemia;  Dilated aortic root ([place]); Aortic stenosis, mild; Type II diabetes mellitus ([place]); GERD (gastroesophageal reflux disease); Arthritis; and CAD (coronary artery disease).   reports that he has quit smoking. He has never used smokeless tobacco.  Past Surgical History  Procedure Laterality Date  . Cataract extraction w/ intraocular lens  implant, bilateral Bilateral   . Cardiac catheterization N/A 12/11/2014    Procedure: Left Heart Cath and Coronary Angiography;  Surgeon: Belva Crome, MD;  Location: [place] [facility];  Service: Cardiovascular;  Laterality: N/A;  . Cardiac catheterization N/A 02/09/2015    Procedure: Coronary Stent Intervention-Rotoblator, temp pacer;  Surgeon: Belva Crome, MD;  Location: [place] [facility];  Service: Cardiovascular;  Laterality: N/A;  . Excisional hemorrhoidectomy  1957  . Eye surgery    . Membrane peel Left 01/11/2015    eye    No Known Allergies  Immunization History  Administered Date(s) Administered  . Influenza,inj,Quad PF,36+ Mos 03/13/2015  . Pneumococcal-Unspecified 06/12/2010    Family History  Problem Relation Age of Onset  . Diabetes Mother   . Heart attack Father   . Diabetes Father   . CAD Father   . Diabetes Sister   . COPD Brother   . Diabetes Sister      Current outpatient prescriptions:  .  aspirin 81 MG tablet, Take 81 mg by mouth every morning. , Disp: , Rfl:  .  atorvastatin (LIPITOR) 20 MG tablet, Take 20 mg by mouth every morning. , Disp: , Rfl:  .  clopidogrel (PLAVIX) 75 MG  tablet, Take 1 tablet (75 mg total) by mouth daily., Disp: 30 tablet, Rfl: 11 .  insulin lispro (HUMALOG) 100 UNIT/ML cartridge, Inject 10 Units into the skin 2 (two) times daily. , Disp: , Rfl:  .  insulin NPH Human (HUMULIN N,NOVOLIN N) 100 UNIT/ML injection, Inject 20 Units into the skin 2 (two) times daily before a meal., Disp: , Rfl:  .  isosorbide mononitrate (IMDUR) 30 MG 24 hr tablet, Take 0.5 tablets (15 mg total) by mouth daily., Disp: 45  tablet, Rfl: 3 .  metoprolol succinate (TOPROL-XL) 25 MG 24 hr tablet, Take 1 tablet (25 mg total) by mouth daily. Take with or immediately following a meal., Disp: 30 tablet, Rfl: 11 .  Multiple Vitamins-Minerals (MULTIVITAMIN WITH MINERALS) tablet, Take 1 tablet by mouth daily., Disp: , Rfl:  .  nitroGLYCERIN (NITROSTAT) 0.4 MG SL tablet, Place 1 tablet (0.4 mg total) under the tongue every 5 (five) minutes as needed for chest pain (up to 3 doses)., Disp: 25 tablet, Rfl: 3 .  pantoprazole (PROTONIX) 20 MG tablet, TAKE 1 TABLET (20 MG TOTAL) BY MOUTH DAILY., Disp: 30 tablet, Rfl: 11 .  ramipril (ALTACE) 2.5 MG capsule, Take 2.5 mg by mouth daily., Disp: , Rfl: 5 .  SERTRALINE HCL PO, Take by mouth daily., Disp: , Rfl:       Review of Systems  Constitutional: Negative for fever and unexpected weight change.  HENT: Negative for congestion, dental problem, ear pain, nosebleeds, postnasal drip, rhinorrhea, sinus pressure, sneezing, sore throat and trouble swallowing.   Eyes: Negative for redness and itching.  Respiratory: Positive for cough and shortness of breath. Negative for chest tightness and wheezing.   Cardiovascular: Negative for palpitations and leg swelling.  Gastrointestinal: Negative for nausea and vomiting.  Genitourinary: Negative for dysuria.  Musculoskeletal: Negative for joint swelling.  Skin: Negative for rash.  Neurological: Negative for headaches.  Hematological: Does not bruise/bleed easily.  Psychiatric/Behavioral: Negative for dysphoric mood. The patient is not nervous/anxious.        Objective:   Physical Exam  Constitutional: He is oriented to person, place, and time. He appears well-developed and well-nourished. No distress.  HENT:  Head: Normocephalic and atraumatic.  Right Ear: External ear normal.  Left Ear: External ear normal.  Mouth/Throat: Oropharynx is clear and moist. No oropharyngeal exudate.  Eyes: Conjunctivae and EOM are normal. Pupils are  equal, round, and reactive to light. Right eye exhibits no discharge. Left eye exhibits no discharge. No scleral icterus.  Neck: Normal range of motion. Neck supple. No JVD present. No tracheal deviation present. No thyromegaly present.  Cardiovascular: Normal rate, regular rhythm and intact distal pulses.  Exam reveals no gallop and no friction rub.   No murmur heard. Pulmonary/Chest: Effort normal and breath sounds normal. No respiratory distress. He has no wheezes. He has no rales. He exhibits no tenderness.  No crackles  Abdominal: Soft. Bowel sounds are normal. He exhibits no distension and no mass. There is no tenderness. There is no rebound and no guarding.  Visible obesity present  Musculoskeletal: Normal range of motion. He exhibits no edema or tenderness.  Lymphadenopathy:    He has no cervical adenopathy.  Neurological: He is alert and oriented to person, place, and time. He has normal reflexes. No cranial nerve deficit. Coordination normal.  Skin: Skin is warm and dry. No rash noted. He is not diaphoretic. No erythema. No pallor.  Psychiatric: He has a normal mood and affect. His behavior is normal. Judgment  and thought content normal.  Nursing note and vitals reviewed.   Filed Vitals:   08/16/15 0924  BP: 146/60  Pulse: 81  Height: 5\' 6"  (1.676 m)  Weight: 181 lb (82.101 kg)  SpO2: 98%         Assessment & Plan:     ICD-9-CM ICD-10-CM   1. Dyspnea and respiratory abnormality 786.09 R06.00 CT Chest High Resolution    R06.89   2. Abnormal PFTs (pulmonary function tests) 794.2 R94.2 CT Chest High Resolution    He is isolated reduction in diffusion capacity. There might be interstitial lung disease on previous CT chest but they were not on high resolution protocol. He does not have crackles. Therefore the overall pretest probably ready for interstitial lung disease is low intermediate. We need to rule out interstitial lung disease with a high-resolution CT chest. If this  is negative then he will need a pulmonary stress test but he feels that he just needs to go exercise and he will decline the pulmonary function test   We will stay in touch    Dr. Brand Males, M.D., [facility].C.P Pulmonary and Critical Care Medicine Staff Physician [place] [facility] Pager: [phone], If no answer or between  15:00h - 7:00h: call [phone]  08/16/2015 10:13 AM

## 2015-08-18 ENCOUNTER — Telehealth: Payer: Self-pay | Admitting: *Deleted

## 2015-08-18 NOTE — Telephone Encounter (Signed)
Patient calling to see if his on and off [facility] could be from taking a whole tablet of Imdur 30mg  tablet when he is only suppose to take 15mg  daily. He does not have SHOB at the moment and no other symptoms or complaints. He said he goes in Friday for a stent and could ask about it then if needed. He also wants to know if we can talk to the pharmacy about his rx refill since he has taken a 90day supply in 45 days.  I let him know I can sent him to a triage nurse but he said he can wait to get a call back, he is in no distress at the moment, just wants to make sure and make [name] aware.  I will sent this message to [facility] and [name]. He would like a call back if possible today to know what to do.

## 2015-08-18 NOTE — Telephone Encounter (Signed)
What kind of stent?

## 2015-08-18 NOTE — Telephone Encounter (Signed)
Called to check on patient. He state he had been taking Imdur 30 mg daily instead of 15 mg. He has not taken the medication for over a week. He refuses to have refill sent in until he is seen by [name]. Clarified with patient he is NOT having stents placed Friday, but having a CT scan.  Patient st does not have SOB currently, but does report DOE at times.  Rescheduled patient to see [name] next week and told him to call if symptoms worsen prior to OV.

## 2015-08-20 ENCOUNTER — Ambulatory Visit (INDEPENDENT_AMBULATORY_CARE_PROVIDER_SITE_OTHER)
Admission: RE | Admit: 2015-08-20 | Discharge: 2015-08-20 | Disposition: A | Discharge status: Home / Self Care | Payer: Medicare Other | Source: Ambulatory Visit | Attending: Internal Medicine | Admitting: Internal Medicine

## 2015-08-20 DIAGNOSIS — R942 Abnormal results of pulmonary function studies: Secondary | ICD-10-CM

## 2015-08-20 DIAGNOSIS — R0609 Other forms of dyspnea: Secondary | ICD-10-CM | POA: Diagnosis not present

## 2015-08-20 DIAGNOSIS — R0600 Dyspnea, unspecified: Secondary | ICD-10-CM

## 2015-08-20 DIAGNOSIS — R0689 Other abnormalities of breathing: Secondary | ICD-10-CM

## 2015-08-23 ENCOUNTER — Telehealth: Payer: Self-pay | Admitting: Internal Medicine

## 2015-08-23 ENCOUNTER — Telehealth: Payer: Self-pay | Admitting: Pulmonary Disease

## 2015-08-23 NOTE — Telephone Encounter (Signed)
See other telephone note for 08/23/15 under Dr Oletta Darter.  Will close this message

## 2015-08-23 NOTE — Telephone Encounter (Signed)
   LET Seth Abbott 19-Nov-1927 that CT does not show  Pulmonary fibrosis. Dyspnea remains unexplained. He hcan do exercise - refer cardiac rehab/pulm rehab for dyspnea/CAD and reurn in 3 months for fu. Or, he can do CPST and return for fu  Dr. Brand Males, M.D., [facility].C.P Pulmonary and Critical Care Medicine Staff Physician [place] [facility] Pager: [phone], If no answer or between  15:00h - 7:00h: call [phone]  08/23/2015 6:41 AM     Ct Chest High Resolution  08/20/2015  CLINICAL DATA:  82 year old male with dyspnea on exertion and abnormal pulmonary function tests. Evaluate for interstitial lung disease. EXAM: CT CHEST WITHOUT CONTRAST TECHNIQUE: Multidetector CT imaging of the chest was performed following the standard protocol without intravenous contrast. High resolution imaging of the lungs, as well as inspiratory and expiratory imaging, was performed. COMPARISON:  Multiple priors, most recently cardiac CT 12/08/2014 and chest CT 10/12/2013. FINDINGS: Mediastinum/Lymph Nodes: Heart size is normal. There is no significant pericardial fluid, thickening or pericardial calcification. There is atherosclerosis of the thoracic aorta, the great vessels of the mediastinum and the coronary arteries, including calcified atherosclerotic plaque in the left main, left anterior descending, left circumflex and right coronary arteries. Severe calcifications of the aortic valve. Mild calcifications of the sub valvular apparatus of the mitral valve. No pathologically enlarged mediastinal or hilar lymph nodes. Please note that accurate exclusion of hilar adenopathy is limited on noncontrast CT scans. Small hiatal hernia. No axillary lymphadenopathy. Lungs/Pleura: High-resolution images demonstrate no significant areas of ground-glass attenuation, subpleural reticulation, parenchymal banding, traction bronchiectasis or frank honeycombing. Inspiratory and  expiratory imaging is unremarkable. No acute consolidative airspace disease. No pleural effusions. No suspicious appearing pulmonary nodules or masses. Tiny calcified granuloma in the periphery of the right upper lobe. Mild bilateral apical pleural parenchymal thickening, most compatible with chronic post infectious or inflammatory scarring. Upper abdomen: Atherosclerosis. Musculoskeletal: There are no aggressive appearing lytic or blastic lesions noted in the visualized portions of the skeleton. IMPRESSION: 1. No findings to suggest interstitial lung disease. 2. Atherosclerosis, including left main and 3 vessel coronary artery disease. 3. There are calcifications of the aortic valve and mitral sub valvular apparatus. Echocardiographic correlation for evaluation of potential valvular dysfunction may be warranted if clinically indicated. 4. Small hiatal hernia. 5. Additional incidental findings, as above. Electronically Signed   By: Vinnie Langton M.D.   On: 08/20/2015 20:03

## 2015-08-23 NOTE — Telephone Encounter (Signed)
Spoke with patient, aware of results and rec's per MR, states that he is unsure which plan to take.  Pt would like to know from Dr Chase Caller which of the two plans he prefers in his Medical Opinion.  Please advise MR. Thanks.

## 2015-08-23 NOTE — Telephone Encounter (Signed)
lmtcb for pt.  

## 2015-08-24 NOTE — Telephone Encounter (Signed)
Left message for patient to call back  

## 2015-08-24 NOTE — Telephone Encounter (Signed)
Refer pulmonary rehab for CAD/dyspnea and ROV back in 4 months. IF getting worse, return sooner

## 2015-08-25 NOTE — Telephone Encounter (Signed)
Ok thanks 

## 2015-08-25 NOTE — Telephone Encounter (Signed)
Called and spoke with pt. Pt states that he would like to wait on the pulmonary rehab at this time. Scheduled him for an ov on 11/19/15 with MR. He voiced understanding and stated that he would call us back if symptoms worsen. I explained to him that I would send a message to MR as [place] Patient to MR

## 2015-08-27 ENCOUNTER — Encounter: Payer: Medicare Other | Admitting: Cardiology

## 2015-08-27 NOTE — Progress Notes (Signed)
This encounter was created in error - please disregard.

## 2015-09-07 ENCOUNTER — Ambulatory Visit: Payer: Medicare Other | Admitting: Cardiology

## 2015-09-07 ENCOUNTER — Institutional Professional Consult (permissible substitution): Payer: Medicare Other | Admitting: Internal Medicine

## 2015-09-07 LAB — BASIC METABOLIC PANEL
BUN: 24 — AB (ref 4–21)
Creatinine: 1 (ref 0.6–1.3)
GLUCOSE: 251

## 2015-09-07 LAB — HEMOGLOBIN A1C: Hemoglobin A1C: 7.8

## 2015-09-07 LAB — HEPATIC FUNCTION PANEL
ALT: 21 (ref 10–40)
AST: 20 (ref 14–40)
Alkaline Phosphatase: 96 (ref 25–125)
Bilirubin, Total: 0.4

## 2015-09-07 LAB — MICROALBUMIN, URINE: MICROALB UR: 4.49

## 2015-09-07 LAB — PSA: PSA: 1.65

## 2015-09-15 ENCOUNTER — Encounter: Payer: Self-pay | Admitting: Cardiology

## 2015-09-15 ENCOUNTER — Ambulatory Visit (INDEPENDENT_AMBULATORY_CARE_PROVIDER_SITE_OTHER): Payer: Medicare Other | Admitting: Cardiology

## 2015-09-15 VITALS — BP 132/72 | HR 76 | Ht 66.0 in | Wt 183.0 lb

## 2015-09-15 DIAGNOSIS — E785 Hyperlipidemia, unspecified: Secondary | ICD-10-CM

## 2015-09-15 DIAGNOSIS — R0602 Shortness of breath: Secondary | ICD-10-CM

## 2015-09-15 DIAGNOSIS — I77810 Thoracic aortic ectasia: Secondary | ICD-10-CM

## 2015-09-15 DIAGNOSIS — I350 Nonrheumatic aortic (valve) stenosis: Secondary | ICD-10-CM | POA: Diagnosis not present

## 2015-09-15 DIAGNOSIS — I2510 Atherosclerotic heart disease of native coronary artery without angina pectoris: Secondary | ICD-10-CM | POA: Diagnosis not present

## 2015-09-15 DIAGNOSIS — I10 Essential (primary) hypertension: Secondary | ICD-10-CM

## 2015-09-15 MED ORDER — ISOSORBIDE MONONITRATE ER 30 MG PO TB24: 15.0000 mg | ORAL_TABLET | Freq: Every day | ORAL | Status: DC

## 2015-09-15 NOTE — Progress Notes (Signed)
Cardiology Office Note    Date:  09/15/2015   ID:  BRITTON HEYSER, DOB [birth year], MRN [id number]  PCP:  Delrae Rend, MD  Cardiologist:  Sueanne Margarita, MD   Chief Complaint  Patient presents with  . Coronary Artery Disease  . Hypertension    History of Present Illness:  Seth Abbott is a 82 y.o. male with a history of HTN, HLD, DM, TIA, mild AS by echo 12/2013, ASCAD with heavily calcified RCA with proximal eccentric 95% stenosi, diffuse calcified proximal to distal LAD disease up to 70% at multiple spots s/pPCI of the RCA and rotational atherectomy/DES to prox RCA 02/09/15.  Today he presents for followup.  He is doing well.  He denies any chest pain, SOB, DOE, LE edema, dizziness, palpitations, claudication or syncope.     Past Medical History  Diagnosis Date  . TIA (transient ischemic attack) early 1990's; 09/2006    [name] 10/26/2010  . HTN (hypertension)   . Prostatitis   . Shingles   . Hyperlipidemia   . Dilated aortic root ([place])   . Aortic stenosis, mild     a. By echo 12/2013.  . Type II diabetes mellitus ([place])   . GERD (gastroesophageal reflux disease)   . Arthritis     "mostly in my hands" (02/09/2015)  . CAD (coronary artery disease)     a. 12/2014 cath showed 95% prox RCA and diffuse calcified prox to distal LAD disease up to 70% at multiple spots. S/p rotational atherectomy/DES to prox RCA 02/09/15.    Past Surgical History  Procedure Laterality Date  . Cataract extraction w/ intraocular lens  implant, bilateral Bilateral   . Cardiac catheterization N/A 12/11/2014    Procedure: Left Heart Cath and Coronary Angiography;  Surgeon: Belva Crome, MD;  Location: [place] [facility];  Service: Cardiovascular;  Laterality: N/A;  . Cardiac catheterization N/A 02/09/2015    Procedure: Coronary Stent Intervention-Rotoblator, temp pacer;  Surgeon: Belva Crome, MD;  Location: [place] [facility];  Service: Cardiovascular;  Laterality: N/A;  . Excisional hemorrhoidectomy   1957  . Eye surgery    . Membrane peel Left 01/11/2015    eye    Current Medications: Outpatient Prescriptions Prior to Visit  Medication Sig Dispense Refill  . aspirin 81 MG tablet Take 81 mg by mouth every morning.     [name] atorvastatin (LIPITOR) 20 MG tablet Take 20 mg by mouth every morning.     . clopidogrel (PLAVIX) 75 MG tablet Take 1 tablet (75 mg total) by mouth daily. 30 tablet 11  . insulin lispro (HUMALOG) 100 UNIT/ML cartridge Inject 10 Units into the skin 2 (two) times daily.     . insulin NPH Human (HUMULIN N,NOVOLIN N) 100 UNIT/ML injection Inject 20 Units into the skin 2 (two) times daily before a meal.    . isosorbide mononitrate (IMDUR) 30 MG 24 hr tablet Take 0.5 tablets (15 mg total) by mouth daily. 45 tablet 3  . metoprolol succinate (TOPROL-XL) 25 MG 24 hr tablet Take 1 tablet (25 mg total) by mouth daily. Take with or immediately following a meal. 30 tablet 11  . Multiple Vitamins-Minerals (MULTIVITAMIN WITH MINERALS) tablet Take 1 tablet by mouth daily.    . nitroGLYCERIN (NITROSTAT) 0.4 MG SL tablet Place 1 tablet (0.4 mg total) under the tongue every 5 (five) minutes as needed for chest pain (up to 3 doses). 25 tablet 3  . pantoprazole (PROTONIX) 20 MG tablet TAKE 1 TABLET (20  MG TOTAL) BY MOUTH DAILY. 30 tablet 11  . ramipril (ALTACE) 2.5 MG capsule Take 2.5 mg by mouth daily.  5  . SERTRALINE HCL PO Take by mouth daily.     No facility-administered medications prior to visit.     Allergies:   Review of patient's allergies indicates no known allergies.   Social History   Social History  . Marital Status: Widowed    Spouse Name: N/A  . Number of Children: N/A  . Years of Education: N/A   Occupational History  . retired    Social History Main Topics  . Smoking status: Former Research scientist (life sciences)  . Smokeless tobacco: Never Used     Comment: "haven't smoked since I was 15" tried it once  . Alcohol Use: 0.0 oz/week    0 Standard drinks or equivalent per week      Comment: 02/09/2015 "might have a sip of wne a dozen times/yr"  . Drug Use: No  . Sexual Activity: Not Currently   Other Topics Concern  . None   Social History Narrative     Family History:  The patient's family history includes CAD in his father; COPD in his brother; Diabetes in his father, mother, sister, and sister; Heart attack in his father.   ROS:   Please see the history of present illness.    ROS All other systems reviewed and are negative.   PHYSICAL EXAM:   VS:  BP 132/72 mmHg  Pulse 76  Ht 5\' 6"  (1.676 m)  Wt 183 lb (83.008 kg)  BMI 29.55 kg/m2   GEN: Well nourished, well developed, in no acute distress HEENT: normal Neck: no JVD, carotid bruits, or masses Cardiac: RRR; no rubs, or gallops,no edema.  Intact distal pulses bilaterally.   2/6 SM at RUSB to LLSB and radiating in to the carotid arteries.   Respiratory:  clear to auscultation bilaterally, normal work of breathing GI: soft, nontender, nondistended, + BS MS: no deformity or atrophy Skin: warm and dry, no rash Neuro:  Alert and Oriented x 3, Strength and sensation are intact Psych: euthymic mood, full affect  Wt Readings from Last 3 Encounters:  09/15/15 183 lb (83.008 kg)  08/16/15 181 lb (82.101 kg)  06/11/15 175 lb (79.379 kg)      Studies/Labs Reviewed:   EKG:  EKG was not ordered today.    Recent Labs: 02/10/2015: Hemoglobin 14.2; Platelets 165 06/23/2015: ALT 22; BUN 19; Creat 1.01; Potassium 4.3; Sodium 137   Lipid Panel    Component Value Date/Time   CHOL 132 06/23/2015 1109   TRIG 68 06/23/2015 1109   HDL 57 06/23/2015 1109   CHOLHDL 2.3 06/23/2015 1109   VLDL 14 06/23/2015 1109   LDLCALC 61 06/23/2015 1109    Additional studies/ records that were reviewed today include:  none    ASSESSMENT:    1. Coronary artery disease involving native coronary artery of native heart without angina pectoris   2. Aortic stenosis, mild   3. Dilated aortic root ([place])   4. Essential  hypertension   5. Hyperlipidemia      PLAN:  In order of problems listed above:  1. ASCAD no angina since PCI of RCA. Continue ASA/Plavix/Imdur/statin/BB 2. Mild AS - stable by echo 06/2015 3. Dilated aortic root - stable by echo 06/2015 4. HTN - well controlled on current regimen.  Continue BB/ACE I. 5. Dyslipidemia - LDL at goal at 57.  Repeat FLP and ALT in 6 months and continue statin.  Continue Lipitor.  Followup with me in 6 months  Medication Adjustments/Labs and Tests Ordered: Current medicines are reviewed at length with the patient today.  Concerns regarding medicines are outlined above.  Medication changes, Labs and Tests ordered today are listed in the Patient Instructions below. There are no Patient Instructions on file for this visit.   Lurena Nida, MD  09/15/2015 8:21 AM    [facility] [address], [place], [place]  [address] Phone: [phone]; Fax: [phone]

## 2015-09-15 NOTE — Patient Instructions (Signed)
Medication Instructions:  Your physician recommends that you continue on your current medications as directed. Please refer to the Current Medication list given to you today.   Labwork: Your physician recommends that you return for FASTING lab work in: 6 months (prior to your visit with [name]).   Testing/Procedures: None  Follow-Up: Your physician wants you to follow-up in: 6 months with [name]. You will receive a reminder letter in the mail two months in advance. If you don't receive a letter, please call our office to schedule the follow-up appointment.   Any Other Special Instructions Will Be Listed Below (If Applicable).     If you need a refill on your cardiac medications before your next appointment, please call your pharmacy.

## 2015-10-05 DIAGNOSIS — E113293 Type 2 diabetes mellitus with mild nonproliferative diabetic retinopathy without macular edema, bilateral: Secondary | ICD-10-CM | POA: Diagnosis not present

## 2015-10-05 DIAGNOSIS — H35372 Puckering of macula, left eye: Secondary | ICD-10-CM | POA: Diagnosis not present

## 2015-11-03 ENCOUNTER — Telehealth: Payer: Self-pay | Admitting: Cardiology

## 2015-11-03 NOTE — Telephone Encounter (Signed)
Spoke with patient and made him aware that he has a one yr rx at the pharmacy. He stated that per the pharmacy it is too soon to refill it. He admits that he had been taking one qd and just realized that he should only be taking one-half daily. He is aware that insurance will not cover it at this time and he will need to pay for the medication oop up until it is due for refill through his insurance. He thanked me for the call and he will follow up with the pharmacy.

## 2015-11-03 NOTE — Telephone Encounter (Signed)
°*  STAT* If patient is at the pharmacy, call can be transferred to refill team.   1. Which medications need to be refilled? (please list name of each medication and dose if known) Isosorbide-need new prescription,pt been taking 1 tablet instead of 1/2 tablet-Which is correct:  2. Which pharmacy/location (including street and city if local pharmacy) is medication to be sent to?[id number]  3. Do they need a 30 day or 90 day supply? Depends on which one pt is taking

## 2015-11-10 ENCOUNTER — Other Ambulatory Visit: Payer: Self-pay | Admitting: Interventional Cardiology

## 2015-11-18 DIAGNOSIS — E1165 Type 2 diabetes mellitus with hyperglycemia: Secondary | ICD-10-CM | POA: Diagnosis not present

## 2015-11-18 DIAGNOSIS — I10 Essential (primary) hypertension: Secondary | ICD-10-CM | POA: Diagnosis not present

## 2015-11-18 DIAGNOSIS — Z794 Long term (current) use of insulin: Secondary | ICD-10-CM | POA: Diagnosis not present

## 2015-11-18 LAB — HEMOGLOBIN A1C
HEMOGLOBIN A1C: 8.2
Hemoglobin A1C: 8.2

## 2015-11-19 ENCOUNTER — Ambulatory Visit: Payer: Medicare Other | Admitting: Internal Medicine

## 2015-11-23 DIAGNOSIS — H401122 Primary open-angle glaucoma, left eye, moderate stage: Secondary | ICD-10-CM | POA: Diagnosis not present

## 2015-11-23 DIAGNOSIS — E119 Type 2 diabetes mellitus without complications: Secondary | ICD-10-CM | POA: Diagnosis not present

## 2015-11-23 DIAGNOSIS — Z0100 Encounter for examination of eyes and vision without abnormal findings: Secondary | ICD-10-CM | POA: Diagnosis not present

## 2015-11-23 DIAGNOSIS — H401111 Primary open-angle glaucoma, right eye, mild stage: Secondary | ICD-10-CM | POA: Diagnosis not present

## 2015-12-28 ENCOUNTER — Ambulatory Visit (INDEPENDENT_AMBULATORY_CARE_PROVIDER_SITE_OTHER): Payer: Medicare Other | Admitting: Internal Medicine

## 2015-12-28 ENCOUNTER — Encounter: Payer: Self-pay | Admitting: Internal Medicine

## 2015-12-28 VITALS — BP 130/64 | HR 71 | Ht 66.0 in | Wt 186.0 lb

## 2015-12-28 DIAGNOSIS — I2510 Atherosclerotic heart disease of native coronary artery without angina pectoris: Secondary | ICD-10-CM

## 2015-12-28 DIAGNOSIS — R0600 Dyspnea, unspecified: Secondary | ICD-10-CM

## 2015-12-28 DIAGNOSIS — R0689 Other abnormalities of breathing: Secondary | ICD-10-CM

## 2015-12-28 NOTE — Patient Instructions (Signed)
ICD-9-CM ICD-10-CM   1. 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

## 2015-12-28 NOTE — Progress Notes (Signed)
Subjective:     Patient ID: Seth Abbott, male   DOB: [birth year], 82 y.o.   MRN: [id number]  HPI   PCP KERR,JEFFREY, MD ' HPI  IOV 08/16/2015  Chief Complaint  Patient presents with  . Pulmonary Consult    Pt referred by [name] for abnormal PFT. Pt c/o mild DOE x 2 years, prod cough with clear mucus x 3 weeks. Pt denies CP/tightness.    82 year old male accompanied by his son. History provided by him and his son and review of the chart. Insidious onset of shortness of breath for the last 2 years. In fall 2016 he had a cardiac stent placed according to his history and since then the dyspnea is worse. He believes this is due to physical deconditioning and him being more sedentary post stent placement. He is not on Brilinta that is associated with dyspnea in's cardiac stent patients.   There is no associated smoking history occupational lung disease exposure history. Symptoms are progressive and rated as moderate in intensity. For the last few weeks he's even had a cough. This no fever or chills or edema or hemoptysis.  Full pulmonary function test 06/29/2015 FVC 3.16 L/105%. FEV1 2.48 L/121%. Ratio 78. No bronchodilator rest months. Total lung capacity 10.38/171%. DLCO 17.77/69%. Overall normal except for isolated reduction in diffusion capacity. Personally visualized the trace  He has had 2 CT scan chest 1 in May 2015 and another one in June 2016. Both are vascular studies. In both there is no comment about lung parenchyma but in my personal opinion visualizing the image there is possibility that he has subtle  subpleural interstitial abnormalitiies   echocardiogram 06/23/2015: Normal ejection fraction with grade 1 diastolic dysfunction    blood lab work 06/23/2015 creatinine 1.01 mg percent and in August 2016 hemoglobin    OV 12/28/2015  Chief Complaint  Patient presents with  . Follow-up    pt has good and bad days- fatigue on bad days.  Denies any breathing complaints today.       Fu dyspnea in setting of isolated low dlco in absence of amio and brilinta but presence of CAD and ace inhiibtor intake and gr1 dias dysfn   Last visti march 2017. HRCT ordered due to low dlco but lung parenchyma was clean. 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 As a result dyspnea is much resolved . He does not feel that he needs more testing or intervention. In April he saw Dr Radford Pax and even at that time he was without chest pain, dyspnea, edema, dizziness, syncope   IMPRESSION: 1. No findings to suggest interstitial lung disease. 2. Atherosclerosis, including left main and 3 vessel coronary artery disease. 3. There are calcifications of the aortic valve and mitral sub valvular apparatus. Echocardiographic correlation for evaluation of potential valvular dysfunction may be warranted if clinically indicated. 4. Small hiatal hernia. 5. Additional incidental findings, as above.   Electronically Signed   By: Vinnie Langton M.D.   On: 08/20/2015 20:03   has a past medical history of TIA (transient ischemic attack) (early 1990's; 09/2006); HTN (hypertension); Prostatitis; Shingles; Hyperlipidemia; Dilated aortic root ([place]); Aortic stenosis, mild; Type II diabetes mellitus ([place]); GERD (gastroesophageal reflux disease); Arthritis; and CAD (coronary artery disease).   reports that he has quit smoking. He has never used smokeless tobacco.  Past Surgical History  Procedure Laterality Date  . Cataract extraction w/ intraocular lens  implant, bilateral Bilateral   .  Cardiac catheterization N/A 12/11/2014    Procedure: Left Heart Cath and Coronary Angiography;  Surgeon: Belva Crome, MD;  Location: [facility];  Service: Cardiovascular;  Laterality: N/A;  . Cardiac catheterization N/A 02/09/2015    Procedure: Coronary Stent Intervention-Rotoblator, temp pacer;  Surgeon: Belva Crome, MD;  Location: [facility];  Service:  Cardiovascular;  Laterality: N/A;  . Excisional hemorrhoidectomy  1957  . Eye surgery    . Membrane peel Left 01/11/2015    eye    No Known Allergies  Immunization History  Administered Date(s) Administered  . Influenza,inj,Quad PF,36+ Mos 03/13/2015  . Pneumococcal-Unspecified 06/12/2010    Family History  Problem Relation Age of Onset  . Diabetes Mother   . Heart attack Father   . Diabetes Father   . CAD Father   . Diabetes Sister   . COPD Brother   . Diabetes Sister      Current outpatient prescriptions:  .  aspirin 81 MG tablet, Take 81 mg by mouth every morning. , Disp: , Rfl:  .  atorvastatin (LIPITOR) 20 MG tablet, Take 20 mg by mouth every morning. , Disp: , Rfl:  .  clopidogrel (PLAVIX) 75 MG tablet, Take 1 tablet (75 mg total) by mouth daily., Disp: 30 tablet, Rfl: 11 .  insulin lispro (HUMALOG) 100 UNIT/ML cartridge, Inject 10 Units into the skin 2 (two) times daily. , Disp: , Rfl:  .  insulin NPH Human (HUMULIN N,NOVOLIN N) 100 UNIT/ML injection, Inject 20 Units into the skin 2 (two) times daily before a meal., Disp: , Rfl:  .  isosorbide mononitrate (IMDUR) 30 MG 24 hr tablet, Take 0.5 tablets (15 mg total) by mouth daily., Disp: 45 tablet, Rfl: 3 .  metoprolol succinate (TOPROL-XL) 25 MG 24 hr tablet, TAKE 1 TABLET (25 MG TOTAL) BY MOUTH DAILY. TAKE WITH OR IMMEDIATELY FOLLOWING A MEAL., Disp: 30 tablet, Rfl: 9 .  Multiple Vitamins-Minerals (MULTIVITAMIN WITH MINERALS) tablet, Take 1 tablet by mouth daily., Disp: , Rfl:  .  nitroGLYCERIN (NITROSTAT) 0.4 MG SL tablet, Place 1 tablet (0.4 mg total) under the tongue every 5 (five) minutes as needed for chest pain (up to 3 doses)., Disp: 25 tablet, Rfl: 3 .  pantoprazole (PROTONIX) 20 MG tablet, TAKE 1 TABLET (20 MG TOTAL) BY MOUTH DAILY., Disp: 30 tablet, Rfl: 11 .  ramipril (ALTACE) 2.5 MG capsule, Take 2.5 mg by mouth daily., Disp: , Rfl: 5 .  SERTRALINE HCL PO, Take by mouth daily., Disp: , Rfl:      Review of  Systems     Objective:   Physical Exam  Constitutional: He is oriented to person, place, and time. He appears well-developed and well-nourished. No distress.  HENT:  Head: Normocephalic and atraumatic.  Right Ear: External ear normal.  Left Ear: External ear normal.  Mouth/Throat: Oropharynx is clear and moist. No oropharyngeal exudate.  Eyes: Conjunctivae and EOM are normal. Pupils are equal, round, and reactive to light. Right eye exhibits no discharge. Left eye exhibits no discharge. No scleral icterus.  Neck: Normal range of motion. Neck supple. No JVD present. No tracheal deviation present. No thyromegaly present.  Cardiovascular: Normal rate, regular rhythm and intact distal pulses.  Exam reveals no gallop and no friction rub.   Murmur heard. Pulmonary/Chest: Effort normal and breath sounds normal. No respiratory distress. He has no wheezes. He has no rales. He exhibits no tenderness.  Abdominal: Soft. Bowel sounds are normal. He exhibits no distension and  no mass. There is no tenderness. There is no rebound and no guarding.  Musculoskeletal: Normal range of motion. He exhibits no edema or tenderness.  Lymphadenopathy:    He has no cervical adenopathy.  Neurological: He is alert and oriented to person, place, and time. He has normal reflexes. No cranial nerve deficit. Coordination normal.  Skin: Skin is warm and dry. No rash noted. He is not diaphoretic. No erythema. No pallor.  Psychiatric: He has a normal mood and affect. His behavior is normal. Judgment and thought content normal.  Nursing note and vitals reviewed.  Vitals:   12/28/15 1156  BP: 130/64  Pulse: 71  SpO2: 96%  Weight: 186 lb (84.4 kg)  Height: 5\' 6"  (1.676 m)       Assessment:       ICD-9-CM ICD-10-CM   1. 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 Brand Males, M.D., [facility].C.P Pulmonary and Critical Care Medicine Staff Physician [facility] Pager: [phone], If no answer or between  15:00h - 7:00h: call [phone]  01/08/2016 6:21 AM

## 2016-01-04 ENCOUNTER — Other Ambulatory Visit: Payer: Self-pay | Admitting: *Deleted

## 2016-01-04 MED ORDER — CLOPIDOGREL BISULFATE 75 MG PO TABS
75.0000 mg | ORAL_TABLET | Freq: Every day | ORAL | 11 refills | Status: DC
Start: 2016-01-04 — End: 2017-01-07

## 2016-03-02 ENCOUNTER — Encounter: Payer: Self-pay | Admitting: Cardiology

## 2016-03-17 ENCOUNTER — Encounter: Payer: Self-pay | Admitting: Cardiology

## 2016-03-17 ENCOUNTER — Ambulatory Visit (INDEPENDENT_AMBULATORY_CARE_PROVIDER_SITE_OTHER): Payer: Medicare Other | Admitting: Cardiology

## 2016-03-17 ENCOUNTER — Encounter (INDEPENDENT_AMBULATORY_CARE_PROVIDER_SITE_OTHER): Payer: Self-pay

## 2016-03-17 ENCOUNTER — Ambulatory Visit: Payer: Medicare Other | Admitting: Cardiology

## 2016-03-17 VITALS — BP 140/70 | HR 75 | Ht 66.0 in | Wt 186.6 lb

## 2016-03-17 DIAGNOSIS — I25708 Atherosclerosis of coronary artery bypass graft(s), unspecified, with other forms of angina pectoris: Secondary | ICD-10-CM | POA: Diagnosis not present

## 2016-03-17 DIAGNOSIS — Z23 Encounter for immunization: Secondary | ICD-10-CM | POA: Diagnosis not present

## 2016-03-17 DIAGNOSIS — E785 Hyperlipidemia, unspecified: Secondary | ICD-10-CM

## 2016-03-17 DIAGNOSIS — I2510 Atherosclerotic heart disease of native coronary artery without angina pectoris: Secondary | ICD-10-CM

## 2016-03-17 DIAGNOSIS — I209 Angina pectoris, unspecified: Secondary | ICD-10-CM

## 2016-03-17 LAB — LIPID PANEL
CHOL/HDL RATIO: 2.4 ratio (ref <=5.0)
Cholesterol: 137 mg/dL (ref 125–200)
HDL: 58 mg/dL (ref >=40)
LDL Cholesterol: 65 mg/dL (ref <130)
Triglycerides: 68 mg/dL (ref <150)
VLDL: 14 mg/dL (ref <30)

## 2016-03-17 LAB — HEPATIC FUNCTION PANEL
ALK PHOS: 84 U/L (ref 40–115)
ALT: 16 U/L (ref 9–46)
AST: 19 U/L (ref 10–35)
Albumin: 4 g/dL (ref 3.6–5.1)
BILIRUBIN DIRECT: 0.1 mg/dL (ref <=0.2)
BILIRUBIN INDIRECT: 0.5 mg/dL (ref 0.2–1.2)
BILIRUBIN TOTAL: 0.6 mg/dL (ref 0.2–1.2)
Total Protein: 6.5 g/dL (ref 6.1–8.1)

## 2016-03-17 NOTE — Progress Notes (Signed)
03/17/2016 Seth Abbott   1928-01-12  [id number]  Primary Physician KERR,JEFFREY, MD Primary Cardiologist: Dr.Turner    Reason for Visit/CC: 6 month F/u for CAD  HPI:  [name] is a 82 y/o male, followed by [name]. His PMH includes HTN, HLD, DM and TIA, mild AS by echo in 12/2013 and CAD with heavily RCA with proximal eccentric 95% stenosis, diffuse calcified proximal to distal LAD disease up to 70% at multiple spots, s/pPCI of the RCA and rotational atherectomy/DES to prox RCA 02/09/15. His LAD disease is being treated medically. He was last seen by [name] 09/15/15. He was doing well w/o anginal symptoms. He was continued on ASA/Plavix/Imdur/statin/BB. He was advised to f/u in 6 months with plans to repeat FLP and HFTs.  He presents to clinic today for f/u. He reports that he has done well. He denies any anginal symptomatology. No chest pain or dyspnea. He denies any exertional symptoms. Also he has not had any symptoms of palpitations, lightheadedness, dizziness, syncope/near-syncope, lower extremity edema, PND or orthopnea. He has been fully compliant with all of his medications and tolerating them all well without side effects. EKG shows normal sinus rhythm with incomplete right bundle branch block. Heart rate 75 bpm. Blood pressure stable at 140/70. He is requesting a flu shot today. He is fasting for labs today.    Current Meds  Medication Sig  . ACCU-CHEK AVIVA PLUS test strip USE TO CHECK BLOOD SUGAR 3 TIMES DAILY  . aspirin 81 MG tablet Take 81 mg by mouth every morning.   [name] atorvastatin (LIPITOR) 20 MG tablet Take 20 mg by mouth every morning.   . clopidogrel (PLAVIX) 75 MG tablet Take 1 tablet (75 mg total) by mouth daily.  . insulin lispro (HUMALOG) 100 UNIT/ML cartridge Inject 10 Units into the skin 2 (two) times daily.   . insulin NPH Human (HUMULIN N,NOVOLIN N) 100 UNIT/ML injection Inject 20 Units into the skin 2 (two) times daily before a meal.  . isosorbide  mononitrate (IMDUR) 30 MG 24 hr tablet Take 0.5 tablets (15 mg total) by mouth daily.  . metoprolol succinate (TOPROL-XL) 25 MG 24 hr tablet TAKE 1 TABLET (25 MG TOTAL) BY MOUTH DAILY. TAKE WITH OR IMMEDIATELY FOLLOWING A MEAL.  . Multiple Vitamins-Minerals (MULTIVITAMIN WITH MINERALS) tablet Take 1 tablet by mouth daily.  . nitroGLYCERIN (NITROSTAT) 0.4 MG SL tablet Place 1 tablet (0.4 mg total) under the tongue every 5 (five) minutes as needed for chest pain (up to 3 doses).  . pantoprazole (PROTONIX) 20 MG tablet TAKE 1 TABLET (20 MG TOTAL) BY MOUTH DAILY.  . ramipril (ALTACE) 2.5 MG capsule Take 2.5 mg by mouth daily.  . SERTRALINE HCL PO Take by mouth daily.   No Known Allergies Past Medical History:  Diagnosis Date  . Aortic stenosis, mild    a. By echo 12/2013.  [name] Arthritis    "mostly in my hands" (02/09/2015)  . CAD (coronary artery disease)    a. 12/2014 cath showed 95% prox RCA and diffuse calcified prox to distal LAD disease up to 70% at multiple spots. S/p rotational atherectomy/DES to prox RCA 02/09/15.  . Dilated aortic root ([place])   . GERD (gastroesophageal reflux disease)   . HTN (hypertension)   . Hyperlipidemia   . Prostatitis   . Shingles   . TIA (transient ischemic attack) early 1990's; 09/2006   [name] 10/26/2010  . Type II diabetes mellitus ([place])    Family History  Problem  Relation Age of Onset  . Diabetes Mother   . Heart attack Father   . Diabetes Father   . CAD Father   . Diabetes Sister   . COPD Brother   . Diabetes Sister    Past Surgical History:  Procedure Laterality Date  . CARDIAC CATHETERIZATION N/A 12/11/2014   Procedure: Left Heart Cath and Coronary Angiography;  Surgeon: Belva Crome, MD;  Location: [facility];  Service: Cardiovascular;  Laterality: N/A;  . CARDIAC CATHETERIZATION N/A 02/09/2015   Procedure: Coronary Stent Intervention-Rotoblator, temp pacer;  Surgeon: Belva Crome, MD;  Location: [facility];  Service:  Cardiovascular;  Laterality: N/A;  . CATARACT EXTRACTION W/ INTRAOCULAR LENS  IMPLANT, BILATERAL Bilateral   . EXCISIONAL HEMORRHOIDECTOMY  1957  . EYE SURGERY    . MEMBRANE PEEL Left 01/11/2015   eye   Social History   Social History  . Marital status: Widowed    Spouse name: N/A  . Number of children: N/A  . Years of education: N/A   Occupational History  . retired    Social History Main Topics  . Smoking status: Former Research scientist (life sciences)  . Smokeless tobacco: Never Used     Comment: "haven't smoked since I was 15" tried it once  . Alcohol use 0.0 oz/week     Comment: 02/09/2015 "might have a sip of wne a dozen times/yr"  . Drug use: No  . Sexual activity: Not Currently   Other Topics Concern  . Not on file   Social History Narrative  . No narrative on file     Review of Systems: General: negative for chills, fever, night sweats or weight changes.  Cardiovascular: negative for chest pain, dyspnea on exertion, edema, orthopnea, palpitations, paroxysmal nocturnal dyspnea or shortness of breath Dermatological: negative for rash Respiratory: negative for cough or wheezing Urologic: negative for hematuria Abdominal: negative for nausea, vomiting, diarrhea, bright red blood per rectum, melena, or hematemesis Neurologic: negative for visual changes, syncope, or dizziness All other systems reviewed and are otherwise negative except as noted above.   Physical Exam:  Height 5\' 6"  (1.676 m), weight 186 lb 9.6 oz (84.6 kg).  General appearance: alert, cooperative and no distress Neck: no carotid bruit and no JVD Lungs: clear to auscultation bilaterally Heart: regular rate and rhythm and 2/6 SM, loudest at RUSB Extremities: extremities normal, atraumatic, no cyanosis or edema Pulses: 2+ and symmetric Skin: Skin color, texture, turgor normal. No rashes or lesions Neurologic: Grossly normal  EKG NSR with incomplete RBBB  ASSESSMENT AND PLAN:   1. CAD: s/p PCI + DES placement to the  proximal RCA 02/09/2015. Residual LAD disease, treated medically (diffuse calcified proximal to distal LAD disease up to 70% at multiple spots). He has remained stable w/o recurrent anginal symptomatology. Continue medical therapy with  ASA/Plavix/Imdur/statin/BB.   2. AS: mild by echo 06/2015. 2/6 SM, loudest at RUSB. Asymptomatic.   3. Dilated Aortic Root:  stable by echo 06/2015  4. HTN: controlled on current regimen.   5. HLD: Last LDL at goal at 57 mg/dL. He has been treated with Lipitor. Will obtain repeat FLP and HFTs today. He is fasting. Tolerating Lipitor w/o side effects.   6. Other Preventive Care: Pt requesting flu shot today. RN to administer.    PLAN  Continue current plan of care. F/u in 6 months with Dr. Hillery Jacks PA-C 03/17/2016 8:12 AM

## 2016-03-17 NOTE — Patient Instructions (Addendum)
Your physician recommends that you continue on your current medications as directed. Please refer to the Current Medication list given to you today. Your physician recommends that you return for lab work in: [place] physician wants you to follow-up in:   6 months with  DR Radford Pax  You will receive a reminder letter in the mail two months in advance. If you don't receive a letter, please call our office to schedule the follow-up appointment.

## 2016-03-27 DIAGNOSIS — E1165 Type 2 diabetes mellitus with hyperglycemia: Secondary | ICD-10-CM | POA: Diagnosis not present

## 2016-03-27 DIAGNOSIS — N419 Inflammatory disease of prostate, unspecified: Secondary | ICD-10-CM | POA: Diagnosis not present

## 2016-03-27 DIAGNOSIS — I77810 Thoracic aortic ectasia: Secondary | ICD-10-CM | POA: Diagnosis not present

## 2016-03-27 DIAGNOSIS — Z794 Long term (current) use of insulin: Secondary | ICD-10-CM | POA: Diagnosis not present

## 2016-03-27 DIAGNOSIS — I2510 Atherosclerotic heart disease of native coronary artery without angina pectoris: Secondary | ICD-10-CM | POA: Diagnosis not present

## 2016-03-27 DIAGNOSIS — Z8673 Personal history of transient ischemic attack (TIA), and cerebral infarction without residual deficits: Secondary | ICD-10-CM | POA: Diagnosis not present

## 2016-03-27 DIAGNOSIS — I10 Essential (primary) hypertension: Secondary | ICD-10-CM | POA: Diagnosis not present

## 2016-03-27 DIAGNOSIS — I350 Nonrheumatic aortic (valve) stenosis: Secondary | ICD-10-CM | POA: Diagnosis not present

## 2016-03-27 DIAGNOSIS — N200 Calculus of kidney: Secondary | ICD-10-CM | POA: Diagnosis not present

## 2016-03-27 LAB — BASIC METABOLIC PANEL
BUN: 18 (ref 4–21)
CREATININE: 1.1 (ref 0.6–1.3)
Glucose: 173
POTASSIUM: 4.8 (ref 3.4–5.3)
SODIUM: 142 (ref 137–147)

## 2016-03-27 LAB — MICROALBUMIN, URINE: MICROALB UR: 10.22

## 2016-03-28 LAB — BASIC METABOLIC PANEL
BUN: 18 (ref 4–21)
CREATININE: 1.1 (ref 0.6–1.3)
GLUCOSE: 173
Potassium: 4.8 (ref 3.4–5.3)
Sodium: 142 (ref 137–147)

## 2016-04-04 DIAGNOSIS — E113292 Type 2 diabetes mellitus with mild nonproliferative diabetic retinopathy without macular edema, left eye: Secondary | ICD-10-CM | POA: Diagnosis not present

## 2016-04-04 DIAGNOSIS — E113291 Type 2 diabetes mellitus with mild nonproliferative diabetic retinopathy without macular edema, right eye: Secondary | ICD-10-CM | POA: Diagnosis not present

## 2016-04-04 DIAGNOSIS — H35372 Puckering of macula, left eye: Secondary | ICD-10-CM | POA: Diagnosis not present

## 2016-05-18 DIAGNOSIS — E1165 Type 2 diabetes mellitus with hyperglycemia: Secondary | ICD-10-CM | POA: Diagnosis not present

## 2016-05-18 DIAGNOSIS — I10 Essential (primary) hypertension: Secondary | ICD-10-CM | POA: Diagnosis not present

## 2016-05-18 DIAGNOSIS — B353 Tinea pedis: Secondary | ICD-10-CM | POA: Diagnosis not present

## 2016-05-18 DIAGNOSIS — Z794 Long term (current) use of insulin: Secondary | ICD-10-CM | POA: Diagnosis not present

## 2016-05-22 DIAGNOSIS — H401112 Primary open-angle glaucoma, right eye, moderate stage: Secondary | ICD-10-CM | POA: Diagnosis not present

## 2016-05-22 DIAGNOSIS — H401122 Primary open-angle glaucoma, left eye, moderate stage: Secondary | ICD-10-CM | POA: Diagnosis not present

## 2016-07-07 ENCOUNTER — Other Ambulatory Visit: Payer: Self-pay | Admitting: Cardiology

## 2016-07-17 ENCOUNTER — Ambulatory Visit (INDEPENDENT_AMBULATORY_CARE_PROVIDER_SITE_OTHER): Payer: Medicare Other | Admitting: Internal Medicine

## 2016-07-17 ENCOUNTER — Encounter: Payer: Self-pay | Admitting: Internal Medicine

## 2016-07-17 VITALS — BP 134/68 | HR 78 | Ht 66.0 in | Wt 189.0 lb

## 2016-07-17 DIAGNOSIS — R0689 Other abnormalities of breathing: Secondary | ICD-10-CM | POA: Diagnosis not present

## 2016-07-17 DIAGNOSIS — R0600 Dyspnea, unspecified: Secondary | ICD-10-CM

## 2016-07-17 NOTE — Progress Notes (Signed)
Subjective:     Patient ID: Seth Abbott, male   DOB: 01-13-1928, 83 y.o.   MRN: [id number]  HPI   PCP KERR,JEFFREY, MD ' HPI  IOV 08/16/2015  Chief Complaint  Patient presents with  . Pulmonary Consult    Pt referred by [name] for abnormal PFT. Pt c/o mild DOE x 2 years, prod cough with clear mucus x 3 weeks. Pt denies CP/tightness.    83 year old male accompanied by his son. History provided by him and his son and review of the chart. Insidious onset of shortness of breath for the last 2 years. In fall 2016 he had a cardiac stent placed according to his history and since then the dyspnea is worse. He believes this is due to physical deconditioning and him being more sedentary post stent placement. He is not on Brilinta that is associated with dyspnea in's cardiac stent patients.   There is no associated smoking history occupational lung disease exposure history. Symptoms are progressive and rated as moderate in intensity. For the last few weeks he's even had a cough. This no fever or chills or edema or hemoptysis.  Full pulmonary function test 06/29/2015 FVC 3.16 L/105%. FEV1 2.48 L/121%. Ratio 78. No bronchodilator rest months. Total lung capacity 10.38/171%. DLCO 17.77/69%. Overall normal except for isolated reduction in diffusion capacity. Personally visualized the trace  He has had 2 CT scan chest 1 in May 2015 and another one in June 2016. Both are vascular studies. In both there is no comment about lung parenchyma but in my personal opinion visualizing the image there is possibility that he has subtle  subpleural interstitial abnormalitiies   echocardiogram 06/23/2015: Normal ejection fraction with grade 1 diastolic dysfunction    blood lab work 06/23/2015 creatinine 1.01 mg percent and in August 2016 hemoglobin    OV 12/28/2015  Chief Complaint  Patient presents with  . Follow-up    pt has good and bad days- fatigue on bad days.  Denies any breathing complaints today.       Fu dyspnea in setting of isolated low dlco in absence of amio and brilinta but presence of CAD and ace inhiibtor intake and gr1 dias dysfn   Last visti march 2017. HRCT ordered due to low dlco but lung parenchyma was clean. 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 As a result dyspnea is much resolved . He does not feel that he needs more testing or intervention. In April he saw Dr Radford Pax and even at that time he was without chest pain, dyspnea, edema, dizziness, syncope   IMPRESSION: 1. No findings to suggest interstitial lung disease. 2. Atherosclerosis, including left main and 3 vessel coronary artery disease. 3. There are calcifications of the aortic valve and mitral sub valvular apparatus. Echocardiographic correlation for evaluation of potential valvular dysfunction may be warranted if clinically indicated. 4. Small hiatal hernia. 5. Additional incidental findings, as above.   Electronically Signed   By: Vinnie Langton M.D.   On: 08/20/2015 20:03   has a past medical history of TIA (transient ischemic attack) (early 1990's; 09/2006); HTN (hypertension); Prostatitis; Shingles; Hyperlipidemia; Dilated aortic root ([place]); Aortic stenosis, mild; Type II diabetes mellitus ([place]); GERD (gastroesophageal reflux disease); Arthritis; and CAD (coronary artery disease).   reports that he has quit smoking.    OV  07/17/2016 Chief Complaint  Patient presents with  . Follow-up    pt doing well, states he is stable at this  time.    83 year old male with grade 1 diastolic dysfunction and obesity with low DLCO were normal however CT chest in March 2017.  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 This visit is an expectant follow-up visit to make sure he is doing okay in the interim Liver function test 03/17/2016 is okay. He tells me that he is actually continuing to do well. There  are no reports of shortness of breath or cough or wheezing. He feels good. He keeps himself active with a chainsaw or walking in the winter. There are no new issues.   has a past medical history of Aortic stenosis, mild; Arthritis; CAD (coronary artery disease); Dilated aortic root ([place]); GERD (gastroesophageal reflux disease); HTN (hypertension); Hyperlipidemia; Prostatitis; Shingles; TIA (transient ischemic attack) (early 1990's; 09/2006); and Type II diabetes mellitus ([place]).   reports that he has quit smoking. He has never used smokeless tobacco.  Past Surgical History:  Procedure Laterality Date  . CARDIAC CATHETERIZATION N/A 12/11/2014   Procedure: Left Heart Cath and Coronary Angiography;  Surgeon: Belva Crome, MD;  Location: [facility] [facility];  Service: Cardiovascular;  Laterality: N/A;  . CARDIAC CATHETERIZATION N/A 02/09/2015   Procedure: Coronary Stent Intervention-Rotoblator, temp pacer;  Surgeon: Belva Crome, MD;  Location: [facility];  Service: Cardiovascular;  Laterality: N/A;  . CATARACT EXTRACTION W/ INTRAOCULAR LENS  IMPLANT, BILATERAL Bilateral   . EXCISIONAL HEMORRHOIDECTOMY  1957  . EYE SURGERY    . MEMBRANE PEEL Left 01/11/2015   eye    No Known Allergies  Immunization History  Administered Date(s) Administered  . Influenza,inj,Quad PF,36+ Mos 03/13/2015, 03/17/2016  . Pneumococcal-Unspecified 06/12/2010    Family History  Problem Relation Age of Onset  . Diabetes Mother   . Heart attack Father   . Diabetes Father   . CAD Father   . Diabetes Sister   . COPD Brother   . Diabetes Sister      Current Outpatient Prescriptions:  .  ACCU-CHEK AVIVA PLUS test strip, USE TO CHECK BLOOD SUGAR 3 TIMES DAILY, Disp: , Rfl: 11 .  aspirin 81 MG tablet, Take 81 mg by mouth every morning. , Disp: , Rfl:  .  atorvastatin (LIPITOR) 20 MG tablet, Take 20 mg by mouth every morning. , Disp: , Rfl:  .  clopidogrel (PLAVIX) 75 MG tablet, Take 1 tablet (75 mg total) by  mouth daily., Disp: 30 tablet, Rfl: 11 .  insulin lispro (HUMALOG) 100 UNIT/ML cartridge, Inject 10 Units into the skin 2 (two) times daily. , Disp: , Rfl:  .  insulin NPH Human (HUMULIN N,NOVOLIN N) 100 UNIT/ML injection, Inject 20 Units into the skin 2 (two) times daily before a meal., Disp: , Rfl:  .  isosorbide mononitrate (IMDUR) 30 MG 24 hr tablet, Take 0.5 tablets (15 mg total) by mouth daily., Disp: 45 tablet, Rfl: 3 .  metoprolol succinate (TOPROL-XL) 25 MG 24 hr tablet, TAKE 1 TABLET (25 MG TOTAL) BY MOUTH DAILY. TAKE WITH OR IMMEDIATELY FOLLOWING A MEAL., Disp: 30 tablet, Rfl: 9 .  Multiple Vitamins-Minerals (MULTIVITAMIN WITH MINERALS) tablet, Take 1 tablet by mouth daily., Disp: , Rfl:  .  nitroGLYCERIN (NITROSTAT) 0.4 MG SL tablet, Place 1 tablet (0.4 mg total) under the tongue every 5 (five) minutes as needed for chest pain (up to 3 doses)., Disp: 25 tablet, Rfl: 3 .  pantoprazole (PROTONIX) 20 MG tablet, TAKE 1 TABLET BY  MOUTH EVERY DAY, Disp: 30 tablet, Rfl: 7 .  ramipril (ALTACE) 2.5 MG capsule, Take 2.5 mg by mouth daily., Disp: , Rfl: 5 .  SERTRALINE HCL PO, Take by mouth daily., Disp: , Rfl:    Review of Systems     Objective:   Physical Exam  Constitutional: He is oriented to person, place, and time. He appears well-developed and well-nourished. No distress.  HENT:  Head: Normocephalic and atraumatic.  Right Ear: External ear normal.  Left Ear: External ear normal.  Mouth/Throat: Oropharynx is clear and moist. No oropharyngeal exudate.  Eyes: Conjunctivae and EOM are normal. Pupils are equal, round, and reactive to light. Right eye exhibits no discharge. Left eye exhibits no discharge. No scleral icterus.  Neck: Normal range of motion. Neck supple. No JVD present. No tracheal deviation present. No thyromegaly present.  Cardiovascular: Normal rate, regular rhythm and intact distal pulses.  Exam reveals no gallop and no friction rub.   Murmur heard. Pulmonary/Chest:  Effort normal and breath sounds normal. No respiratory distress. He has no wheezes. He has no rales. He exhibits no tenderness.  Abdominal: Soft. Bowel sounds are normal. He exhibits no distension and no mass. There is no tenderness. There is no rebound and no guarding.  Visceral obesity present  Musculoskeletal: Normal range of motion. He exhibits no edema or tenderness.  Lymphadenopathy:    He has no cervical adenopathy.  Neurological: He is alert and oriented to person, place, and time. He has normal reflexes. No cranial nerve deficit. Coordination normal.  Skin: Skin is warm and dry. No rash noted. He is not diaphoretic. No erythema. No pallor.  Psychiatric: He has a normal mood and affect. His behavior is normal. Judgment and thought content normal.  Nursing note and vitals reviewed.   Vitals:   07/17/16 1439  BP: 134/68  Pulse: 78  SpO2: 96%  Weight: 189 lb (85.7 kg)  Height: 5\' 6"  (1.676 m)    Estimated body mass index is 30.51 kg/m as calculated from the following:   Height as of this encounter: 5\' 6"  (1.676 m).   Weight as of this encounter: 189 lb (85.7 kg).      Assessment:       ICD-9-CM ICD-10-CM   1. Dyspnea and respiratory abnormality 786.09 R06.00     R06.89    Dyspnea is resolved after doing some physical activity. During his dyspnea is due to grade 1 diastolic dysfunction on physical deconditioning and visceral obesity along with his atrial fibrillation. At this point in time is doing well so we'll maintain expectant follow-up    Plan:     Expectant follow-up  Dr. Brand Males, M.D., [facility].C.P Pulmonary and Critical Care Medicine Staff Physician [facility] Pager: [phone], If no answer or between  15:00h - 7:00h: call [phone]  07/17/2016 2:56 PM

## 2016-07-17 NOTE — Patient Instructions (Signed)
ICD-9-CM ICD-10-CM   1. 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

## 2016-08-06 ENCOUNTER — Other Ambulatory Visit: Payer: Self-pay | Admitting: Cardiology

## 2016-08-06 DIAGNOSIS — R0602 Shortness of breath: Secondary | ICD-10-CM

## 2016-08-22 ENCOUNTER — Other Ambulatory Visit: Payer: Self-pay | Admitting: Cardiology

## 2016-08-22 DIAGNOSIS — R0602 Shortness of breath: Secondary | ICD-10-CM

## 2016-08-22 DIAGNOSIS — R946 Abnormal results of thyroid function studies: Secondary | ICD-10-CM | POA: Diagnosis not present

## 2016-08-22 DIAGNOSIS — I10 Essential (primary) hypertension: Secondary | ICD-10-CM | POA: Diagnosis not present

## 2016-08-22 DIAGNOSIS — E1165 Type 2 diabetes mellitus with hyperglycemia: Secondary | ICD-10-CM | POA: Diagnosis not present

## 2016-08-22 DIAGNOSIS — Z794 Long term (current) use of insulin: Secondary | ICD-10-CM | POA: Diagnosis not present

## 2016-08-22 LAB — TSH
TSH: 5.5 (ref 0.41–5.90)
TSH: 5.5 (ref 0.41–5.90)

## 2016-08-22 LAB — HEMOGLOBIN A1C
HEMOGLOBIN A1C: 8.6
Hemoglobin A1C: 8.6

## 2016-08-22 NOTE — Telephone Encounter (Signed)
Medication Detail    Disp Refills Start End   isosorbide mononitrate (IMDUR) 30 MG 24 hr tablet 45 tablet 2 08/07/2016    Sig - Route: TAKE 0.5 TABLETS (15 MG TOTAL) BY MOUTH DAILY. - Oral   E-Prescribing Status: Receipt confirmed by pharmacy (08/07/2016 3:55 PM EST)   Associated Diagnoses   Shortness of breath     Pharmacy   [id number] - [place], [place] - [place].

## 2016-09-25 DIAGNOSIS — E1165 Type 2 diabetes mellitus with hyperglycemia: Secondary | ICD-10-CM | POA: Diagnosis not present

## 2016-09-25 DIAGNOSIS — I10 Essential (primary) hypertension: Secondary | ICD-10-CM | POA: Diagnosis not present

## 2016-09-25 DIAGNOSIS — I77810 Thoracic aortic ectasia: Secondary | ICD-10-CM | POA: Diagnosis not present

## 2016-09-25 DIAGNOSIS — R350 Frequency of micturition: Secondary | ICD-10-CM | POA: Diagnosis not present

## 2016-09-25 DIAGNOSIS — Z7189 Other specified counseling: Secondary | ICD-10-CM | POA: Diagnosis not present

## 2016-09-25 DIAGNOSIS — I2510 Atherosclerotic heart disease of native coronary artery without angina pectoris: Secondary | ICD-10-CM | POA: Diagnosis not present

## 2016-09-25 DIAGNOSIS — E119 Type 2 diabetes mellitus without complications: Secondary | ICD-10-CM | POA: Diagnosis not present

## 2016-09-25 DIAGNOSIS — N200 Calculus of kidney: Secondary | ICD-10-CM | POA: Diagnosis not present

## 2016-09-25 DIAGNOSIS — Z0000 Encounter for general adult medical examination without abnormal findings: Secondary | ICD-10-CM | POA: Diagnosis not present

## 2016-09-25 DIAGNOSIS — E785 Hyperlipidemia, unspecified: Secondary | ICD-10-CM | POA: Diagnosis not present

## 2016-09-25 DIAGNOSIS — Z1389 Encounter for screening for other disorder: Secondary | ICD-10-CM | POA: Diagnosis not present

## 2016-09-25 DIAGNOSIS — Z794 Long term (current) use of insulin: Secondary | ICD-10-CM | POA: Diagnosis not present

## 2016-09-25 DIAGNOSIS — I351 Nonrheumatic aortic (valve) insufficiency: Secondary | ICD-10-CM | POA: Diagnosis not present

## 2016-09-25 LAB — BASIC METABOLIC PANEL
BUN: 20 (ref 4–21)
BUN: 20 (ref 4–21)
CREATININE: 1.1 (ref 0.6–1.3)
Creatinine: 1.1 (ref 0.6–1.3)
Glucose: 170
Glucose: 170
POTASSIUM: 4.3 (ref 3.4–5.3)
Potassium: 4.3 (ref 3.4–5.3)
Sodium: 136 — AB (ref 137–147)
Sodium: 136 — AB (ref 137–147)

## 2016-09-25 LAB — HEPATIC FUNCTION PANEL
ALK PHOS: 86 (ref 25–125)
ALT: 12 (ref 10–40)
ALT: 12 (ref 10–40)
AST: 14 (ref 14–40)
AST: 14 (ref 14–40)
Alkaline Phosphatase: 86 (ref 25–125)
Bilirubin, Total: 0.4
Bilirubin, Total: 0.4

## 2016-09-25 LAB — CBC AND DIFFERENTIAL
HEMATOCRIT: 36 — AB (ref 41–53)
Hemoglobin: 12.3 — AB (ref 13.5–17.5)
PLATELETS: 196 (ref 150–399)
WBC: 5

## 2016-09-25 LAB — HEMOGLOBIN A1C: HEMOGLOBIN A1C: 8.6

## 2016-09-25 LAB — LIPID PANEL
Cholesterol: 111 (ref 0–200)
HDL: 44 (ref 35–70)
LDL Cholesterol: 54
Triglycerides: 68 (ref 40–160)

## 2016-09-25 LAB — TSH: TSH: 5.5 (ref 0.41–5.90)

## 2016-10-05 ENCOUNTER — Other Ambulatory Visit: Payer: Self-pay | Admitting: Interventional Cardiology

## 2016-10-05 DIAGNOSIS — E113293 Type 2 diabetes mellitus with mild nonproliferative diabetic retinopathy without macular edema, bilateral: Secondary | ICD-10-CM | POA: Diagnosis not present

## 2016-10-05 DIAGNOSIS — H35372 Puckering of macula, left eye: Secondary | ICD-10-CM | POA: Diagnosis not present

## 2016-10-20 ENCOUNTER — Ambulatory Visit: Payer: Medicare Other | Admitting: Cardiology

## 2016-11-10 ENCOUNTER — Encounter (INDEPENDENT_AMBULATORY_CARE_PROVIDER_SITE_OTHER): Payer: Self-pay

## 2016-11-10 ENCOUNTER — Encounter: Payer: Self-pay | Admitting: Cardiology

## 2016-11-10 ENCOUNTER — Ambulatory Visit (INDEPENDENT_AMBULATORY_CARE_PROVIDER_SITE_OTHER): Payer: Medicare Other | Admitting: Cardiology

## 2016-11-10 VITALS — BP 132/70 | HR 70 | Ht 66.0 in | Wt 178.6 lb

## 2016-11-10 DIAGNOSIS — I350 Nonrheumatic aortic (valve) stenosis: Secondary | ICD-10-CM | POA: Diagnosis not present

## 2016-11-10 DIAGNOSIS — E785 Hyperlipidemia, unspecified: Secondary | ICD-10-CM

## 2016-11-10 DIAGNOSIS — I77810 Thoracic aortic ectasia: Secondary | ICD-10-CM | POA: Diagnosis not present

## 2016-11-10 DIAGNOSIS — I2510 Atherosclerotic heart disease of native coronary artery without angina pectoris: Secondary | ICD-10-CM

## 2016-11-10 NOTE — Patient Instructions (Signed)
Medication Instructions:   Your physician recommends that you continue on your current medications as directed. Please refer to the Current Medication list given to you today.   If you need a refill on your cardiac medications before your next appointment, please call your pharmacy.  Labwork:  RETURN FOR FASTING LIVER AND LIPID PANEL ONE WEEK BEFORE 6 MONTH FOLLOW UP  APPOINTMENT   Testing/Procedures: Your physician has requested that you have an echocardiogram. Echocardiography is a painless test that uses sound waves to create images of your heart. It provides your doctor with information about the size and shape of your heart and how well your heart's chambers and valves are working. This procedure takes approximately one hour. There are no restrictions for this procedure.    Follow-Up: Your physician wants you to follow-up in:  IN  [place] will receive a reminder letter in the mail two months in advance. If you don't receive a letter, please call our office to schedule the follow-up appointment.      Any Other Special Instructions Will Be Listed Below (If Applicable).

## 2016-11-10 NOTE — Progress Notes (Signed)
11/10/2016 Seth Abbott   01/01/1928  [id number]  Primary Physician Delrae Rend, MD Primary Cardiologist: [name]   Reason for Visit/CC: 6 Month f/u for CAD, mildly dilated Aortic root, mild AS  HPI:  Seth Abbott is a 83 y.o. male, followed by [name], who presents to clinic today for 6 month f/u. His cardiac history is notable for CAD with heavily calcified RCA with proximal eccentric 95% stenosis, diffuse calcified proximal to distal LAD disease up to 70% at multiple spots, s/p PCI of the RCA and rotational atherectomy/DES to prox RCA 02/09/15. His LAD disease is being treated medically. He also has a h/o HTN, HLD, DM, TIA and mild AS by echo 12/2013. However f/u 2D echo done in 06/2015 reported mildly calcified aortic valve annulus, tri leaflet, normal thickness, normal cusp separation. No AS or AI was noted. Mean gradient was 9 mmHg. LVEF was also noted to be normal at 60-65% with Grade 1DD and normal wall motion. There is also mention of a h/o of a dilated aortic root, however aortic root was reported to be normal in last study. His Lipids have been well controlled. His last lipid panel 03/2016 showed LDL to be at goal at 65 mg/dL.   He reports that he has done well. He is active in his yard. He has planted a garden and tends to it. He denies any exertional CP or dyspnea. No syncope/ near syncope, dizziness, palpitations, LEE, orthopnea or PND. He reports full med compliance. No med changes since his last office visit. His PCP follows his DM.   EKG today shows NSR. HR stable in the 70s. BP is well controlled.     Current Meds  Medication Sig  . ACCU-CHEK AVIVA PLUS test strip USE TO CHECK BLOOD SUGAR 3 TIMES DAILY  . aspirin 81 MG tablet Take 81 mg by mouth every morning.   [name] atorvastatin (LIPITOR) 20 MG tablet Take 20 mg by mouth every morning.   . clopidogrel (PLAVIX) 75 MG tablet Take 1 tablet (75 mg total) by mouth daily.  . insulin lispro (HUMALOG) 100 UNIT/ML cartridge  Inject 10 Units into the skin 2 (two) times daily.   . insulin NPH Human (HUMULIN N,NOVOLIN N) 100 UNIT/ML injection Inject 20 Units into the skin 2 (two) times daily before a meal.  . isosorbide mononitrate (IMDUR) 30 MG 24 hr tablet TAKE 0.5 TABLETS (15 MG TOTAL) BY MOUTH DAILY.  . metoprolol succinate (TOPROL-XL) 25 MG 24 hr tablet TAKE 1 TABLET (25 MG TOTAL) BY MOUTH DAILY. TAKE WITH OR IMMEDIATELY FOLLOWING A MEAL.  . Multiple Vitamins-Minerals (MULTIVITAMIN WITH MINERALS) tablet Take 1 tablet by mouth daily.  . nitroGLYCERIN (NITROSTAT) 0.4 MG SL tablet Place 1 tablet (0.4 mg total) under the tongue every 5 (five) minutes as needed for chest pain (up to 3 doses).  . pantoprazole (PROTONIX) 20 MG tablet TAKE 1 TABLET BY MOUTH EVERY DAY  . ramipril (ALTACE) 2.5 MG capsule Take 2.5 mg by mouth daily.  . SERTRALINE HCL PO Take by mouth daily. As directed. Pt unsure of strength/dose   No Known Allergies Past Medical History:  Diagnosis Date  . Aortic stenosis, mild    a. By echo 12/2013.  [name] Arthritis    "mostly in my hands" (02/09/2015)  . CAD (coronary artery disease)    a. 12/2014 cath showed 95% prox RCA and diffuse calcified prox to distal LAD disease up to 70% at multiple spots. S/p rotational atherectomy/DES to prox  RCA 02/09/15.  . Dilated aortic root ([place])   . GERD (gastroesophageal reflux disease)   . HTN (hypertension)   . Hyperlipidemia   . Prostatitis   . Shingles   . TIA (transient ischemic attack) early 1990's; 09/2006   [name] 10/26/2010  . Type II diabetes mellitus ([facility])    Family History  Problem Relation Age of Onset  . Diabetes Mother   . Heart attack Father   . Diabetes Father   . CAD Father   . Diabetes Sister   . COPD Brother   . Diabetes Sister    Past Surgical History:  Procedure Laterality Date  . CARDIAC CATHETERIZATION N/A 12/11/2014   Procedure: Left Heart Cath and Coronary Angiography;  Surgeon: Belva Crome, MD;  Location: [place];   Service: Cardiovascular;  Laterality: N/A;  . CARDIAC CATHETERIZATION N/A 02/09/2015   Procedure: Coronary Stent Intervention-Rotoblator, temp pacer;  Surgeon: Belva Crome, MD;  Location: [place] [facility];  Service: Cardiovascular;  Laterality: N/A;  . CATARACT EXTRACTION W/ INTRAOCULAR LENS  IMPLANT, BILATERAL Bilateral   . EXCISIONAL HEMORRHOIDECTOMY  1957  . EYE SURGERY    . MEMBRANE PEEL Left 01/11/2015   eye   Social History   Social History  . Marital status: Widowed    Spouse name: N/A  . Number of children: N/A  . Years of education: N/A   Occupational History  . retired    Social History Main Topics  . Smoking status: Former Research scientist (life sciences)  . Smokeless tobacco: Never Used     Comment: "haven't smoked since I was 15" tried it once  . Alcohol use 0.0 oz/week     Comment: 02/09/2015 "might have a sip of wne a dozen times/yr"  . Drug use: No  . Sexual activity: Not Currently   Other Topics Concern  . Not on file   Social History Narrative  . No narrative on file     Review of Systems: General: negative for chills, fever, night sweats or weight changes.  Cardiovascular: negative for chest pain, dyspnea on exertion, edema, orthopnea, palpitations, paroxysmal nocturnal dyspnea or shortness of breath Dermatological: negative for rash Respiratory: negative for cough or wheezing Urologic: negative for hematuria Abdominal: negative for nausea, vomiting, diarrhea, bright red blood per rectum, melena, or hematemesis Neurologic: negative for visual changes, syncope, or dizziness All other systems reviewed and are otherwise negative except as noted above.   Physical Exam:  Blood pressure 132/70, pulse 70, height 5\' 6"  (1.676 m), weight 178 lb 9.6 oz (81 kg), SpO2 97 %.  General appearance: alert, cooperative and no distress Neck: no carotid bruit and no JVD Lungs: clear to auscultation bilaterally Heart: regular rate and rhythm and 2/6 SM, loudest at RUSB Extremities:  extremities normal, atraumatic, no cyanosis or edema Pulses: 2+ and symmetric Skin: Skin color, texture, turgor normal. No rashes or lesions Neurologic: Grossly normal  EKG NSR. No ischemia -- personally reviewed   ASSESSMENT AND PLAN:   1. CAD: s/p PCI + DES placement to the proximal RCA 02/09/2015. Residual LAD disease, treated medically (diffuse calcified proximal to distal LAD disease up to 70% at multiple spots). He denies any symptoms of angina. No exertional CP or dyspnea. EKG is nonischemic. Continue medical therapy with  ASA/Plavix/Imdur/statin/BB. VS and lipids are well controlled.   2. AS: mild by echo in 2015. Reported as normal 06/2015. Also with h/o mildly aortic root dilatation. He has a 2/6 SM, loudest at RUSB. Asymptomatic. We will  repeat 2D echo for reassessment of valve/root.   3. Dilated Aortic Root:  stable by echo 06/2015. Repeat 2D echo. Continue BB. HR and BP are both controlled.   4. HTN: controlled on current regimen.   5. HLD: continue with Lipitor. Last lipid panel 03/2016 showed controlled LDL at 65 mg/dL. He reports full compliance. Tolerating Lipitor w/o side effects. He is not fasting today. We will repeat lipid panel and HFTs in 6 months, prior to his f/u with [name]  6. DM: followed by PCP.   Follow-Up w/ [name] in 6 months   Linsey Arteaga Ladoris Gene, MHS [facility] 11/10/2016 9:41 AM

## 2016-11-23 ENCOUNTER — Ambulatory Visit (HOSPITAL_COMMUNITY): Payer: Medicare Other | Attending: Cardiology

## 2016-11-23 ENCOUNTER — Other Ambulatory Visit: Payer: Self-pay

## 2016-11-23 DIAGNOSIS — I350 Nonrheumatic aortic (valve) stenosis: Secondary | ICD-10-CM | POA: Diagnosis not present

## 2016-11-23 DIAGNOSIS — I77810 Thoracic aortic ectasia: Secondary | ICD-10-CM | POA: Diagnosis not present

## 2016-11-23 DIAGNOSIS — I517 Cardiomegaly: Secondary | ICD-10-CM | POA: Insufficient documentation

## 2016-11-28 DIAGNOSIS — Z794 Long term (current) use of insulin: Secondary | ICD-10-CM | POA: Diagnosis not present

## 2016-11-28 DIAGNOSIS — R946 Abnormal results of thyroid function studies: Secondary | ICD-10-CM | POA: Diagnosis not present

## 2016-11-28 DIAGNOSIS — I10 Essential (primary) hypertension: Secondary | ICD-10-CM | POA: Diagnosis not present

## 2016-11-28 DIAGNOSIS — E1165 Type 2 diabetes mellitus with hyperglycemia: Secondary | ICD-10-CM | POA: Diagnosis not present

## 2016-11-28 LAB — HEMOGLOBIN A1C: HEMOGLOBIN A1C: 8.3

## 2016-12-30 IMAGING — CT CT CHEST HIGH RESOLUTION W/O CM
3 of 8 series · 14 of 36 positions shown, 16 images · non-contrast
Comparison: Multiple priors, most recently cardiac CT 12/08/2014
and chest CT 10/12/2013.

CLINICAL DATA: 87-year-old male with dyspnea on exertion and
abnormal pulmonary function tests. Evaluate for interstitial lung
disease.

EXAM:
CT CHEST WITHOUT CONTRAST
TECHNIQUE: Multidetector CT imaging of the chest was performed following the
standard protocol without intravenous contrast. High resolution
imaging of the lungs, as well as inspiratory and expiratory imaging,
was performed.

[Series 4: high resolution · axial · 0.65mm/px · z∈[-288,-58]mm · 8 of 60 slices shown, 10 images]
[im 7/60  mediastinal]
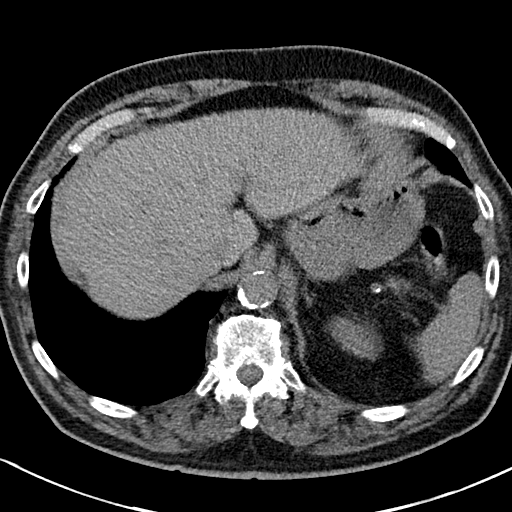
[im 7/60  lung]
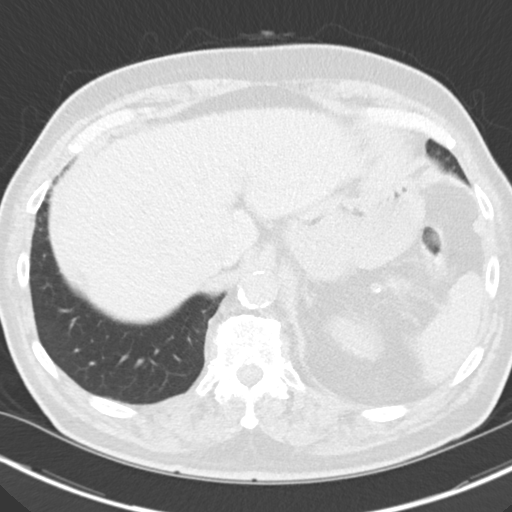
[im 14/60  lung]
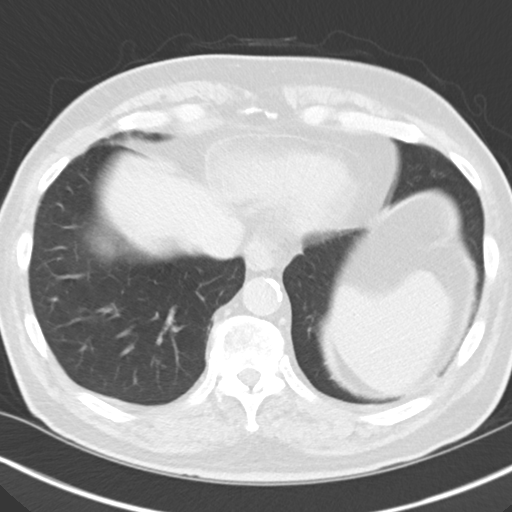
[im 20/60  lung]
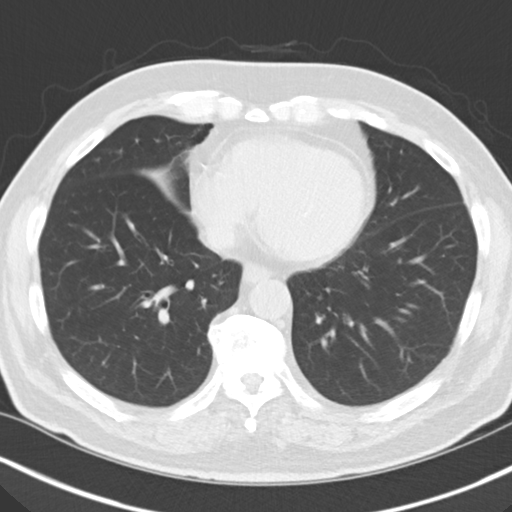
[im 27/60  lung]
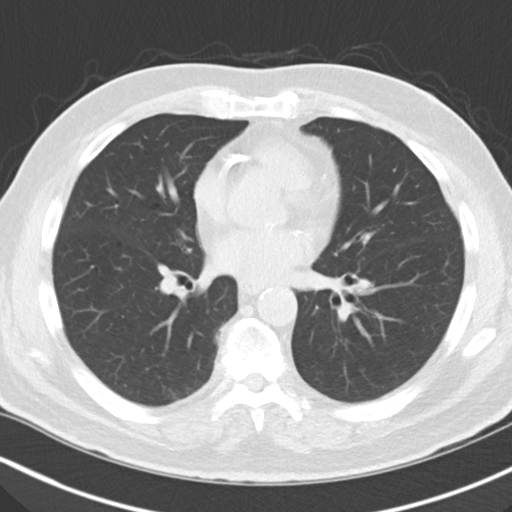
[im 33/60  mediastinal]
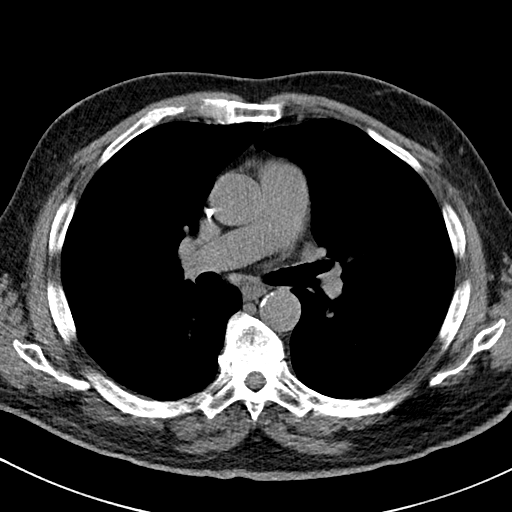
[im 33/60  lung]
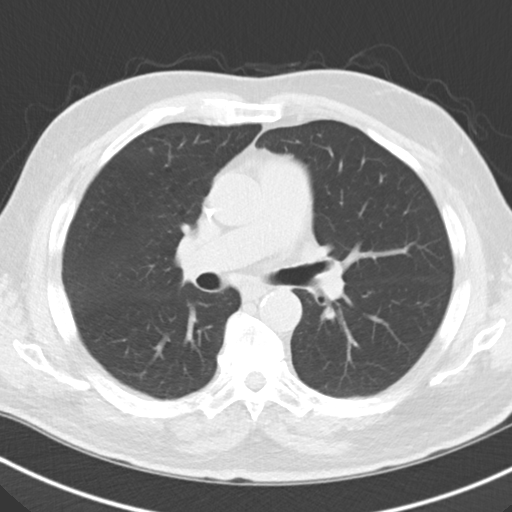
[im 40/60  lung]
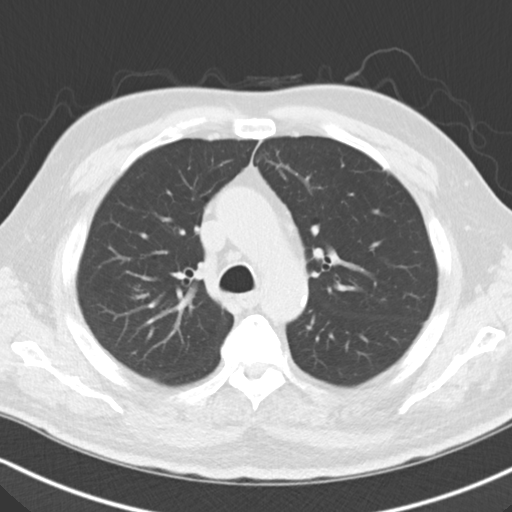
[im 46/60  lung]
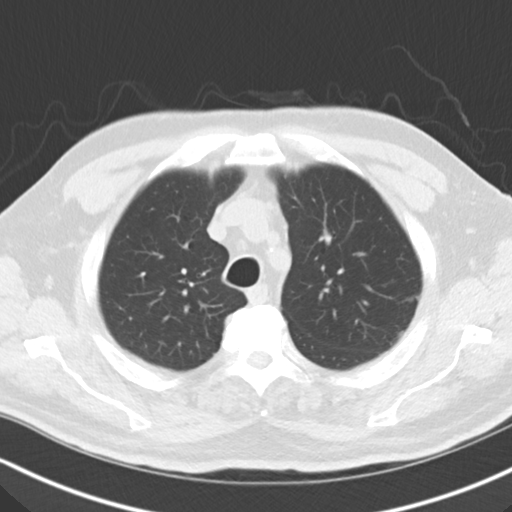
[im 53/60  lung]
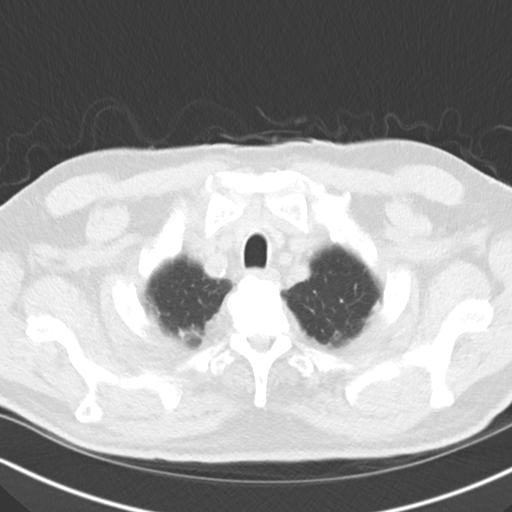

[Series 7: high resolutionretro · axial · 0.65mm/px · z∈[-243,-103]mm · 3 of 30 slices shown]
[im 8/30  lung]
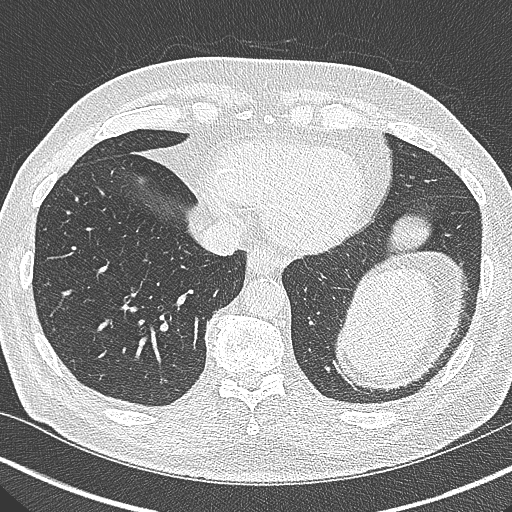
[im 15/30  lung]
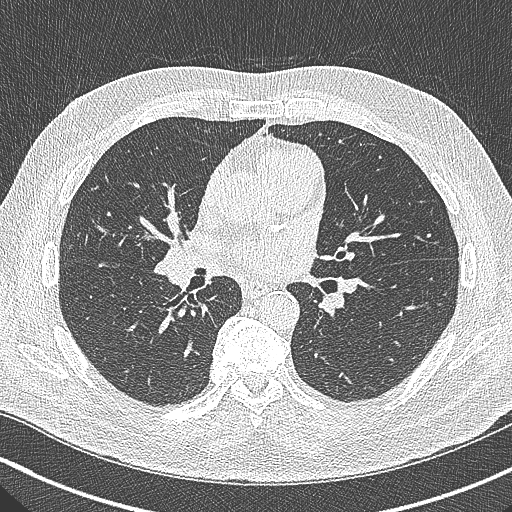
[im 22/30  lung]
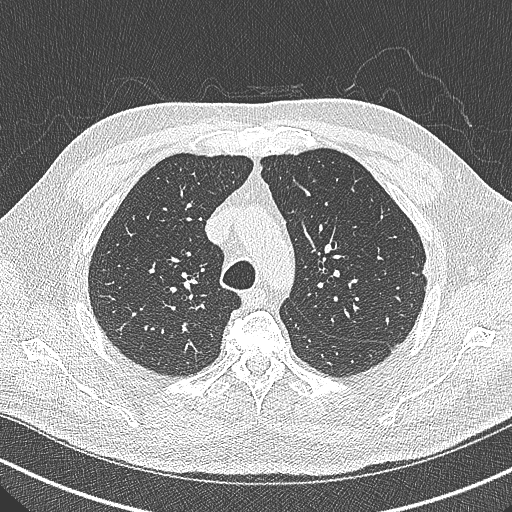

[Series 8: coronal · coronal · 0.59mm/px · 3 of 130 slices shown]
[im 26/130  lung]
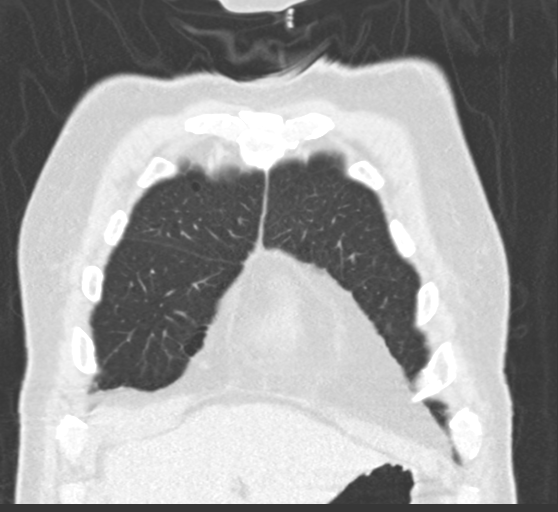
[im 52/130  lung]
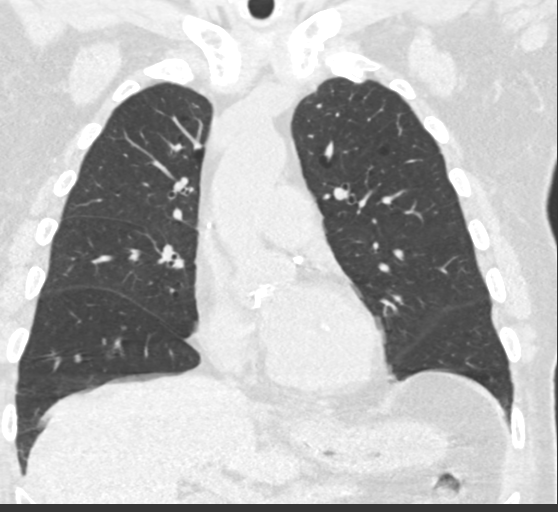
[im 78/130  lung]
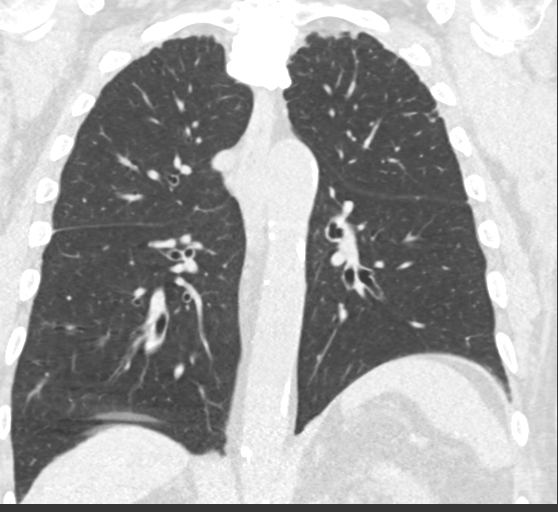

[14 of 36 positions shown; findings below may reference images not displayed]

FINDINGS: Mediastinum/Lymph Nodes: Heart size is normal. There is no
significant pericardial fluid, thickening or pericardial
calcification. There is atherosclerosis of the thoracic aorta, the
great vessels of the mediastinum and the coronary arteries,
including calcified atherosclerotic plaque in the left main, left
anterior descending, left circumflex and right coronary arteries.
Severe calcifications of the aortic valve. Mild calcifications of
the sub valvular apparatus of the mitral valve. No pathologically
enlarged mediastinal or hilar lymph nodes. Please note that accurate
exclusion of hilar adenopathy is limited on noncontrast CT scans.
Small hiatal hernia. No axillary lymphadenopathy.

Lungs/Pleura: High-resolution images demonstrate no significant
areas of ground-glass attenuation, subpleural reticulation,
parenchymal banding, traction bronchiectasis or frank honeycombing.
Inspiratory and expiratory imaging is unremarkable. No acute
consolidative airspace disease. No pleural effusions. No suspicious
appearing pulmonary nodules or masses. Tiny calcified granuloma in
the periphery of the right upper lobe. Mild bilateral apical pleural
parenchymal thickening, most compatible with chronic post infectious
or inflammatory scarring.

Upper abdomen: Atherosclerosis.

Musculoskeletal: There are no aggressive appearing lytic or blastic
lesions noted in the visualized portions of the skeleton.
IMPRESSION: 1. No findings to suggest interstitial lung disease.
2. Atherosclerosis, including left main and 3 vessel coronary artery
disease.
3. There are calcifications of the aortic valve and mitral sub
valvular apparatus. Echocardiographic correlation for evaluation of
potential valvular dysfunction may be warranted if clinically
indicated.
4. Small hiatal hernia.
5. Additional incidental findings, as above.

## 2017-01-02 ENCOUNTER — Emergency Department (HOSPITAL_COMMUNITY): Payer: Medicare Other

## 2017-01-02 ENCOUNTER — Encounter (HOSPITAL_COMMUNITY): Payer: Self-pay | Admitting: Emergency Medicine

## 2017-01-02 ENCOUNTER — Inpatient Hospital Stay (HOSPITAL_COMMUNITY)
Admission: EM | Admit: 2017-01-02 | Discharge: 2017-01-05 | DRG: 286 | Disposition: A | Discharge status: Home / Self Care | Payer: Medicare Other | Attending: Internal Medicine | Admitting: Internal Medicine

## 2017-01-02 ENCOUNTER — Encounter: Payer: Self-pay | Admitting: Family Medicine

## 2017-01-02 ENCOUNTER — Ambulatory Visit (INDEPENDENT_AMBULATORY_CARE_PROVIDER_SITE_OTHER): Payer: Medicare Other | Admitting: Family Medicine

## 2017-01-02 VITALS — BP 110/68 | HR 87 | Ht 64.0 in | Wt 183.9 lb

## 2017-01-02 DIAGNOSIS — R0602 Shortness of breath: Secondary | ICD-10-CM | POA: Diagnosis not present

## 2017-01-02 DIAGNOSIS — I2510 Atherosclerotic heart disease of native coronary artery without angina pectoris: Secondary | ICD-10-CM

## 2017-01-02 DIAGNOSIS — I25118 Atherosclerotic heart disease of native coronary artery with other forms of angina pectoris: Principal | ICD-10-CM | POA: Diagnosis present

## 2017-01-02 DIAGNOSIS — I10 Essential (primary) hypertension: Secondary | ICD-10-CM | POA: Diagnosis present

## 2017-01-02 DIAGNOSIS — I5030 Unspecified diastolic (congestive) heart failure: Secondary | ICD-10-CM

## 2017-01-02 DIAGNOSIS — I4510 Unspecified right bundle-branch block: Secondary | ICD-10-CM | POA: Diagnosis present

## 2017-01-02 DIAGNOSIS — Y92230 Patient room in hospital as the place of occurrence of the external cause: Secondary | ICD-10-CM | POA: Diagnosis not present

## 2017-01-02 DIAGNOSIS — W19XXXA Unspecified fall, initial encounter: Secondary | ICD-10-CM | POA: Diagnosis not present

## 2017-01-02 DIAGNOSIS — J9811 Atelectasis: Secondary | ICD-10-CM | POA: Diagnosis present

## 2017-01-02 DIAGNOSIS — J40 Bronchitis, not specified as acute or chronic: Secondary | ICD-10-CM | POA: Diagnosis present

## 2017-01-02 DIAGNOSIS — Z7982 Long term (current) use of aspirin: Secondary | ICD-10-CM

## 2017-01-02 DIAGNOSIS — E1165 Type 2 diabetes mellitus with hyperglycemia: Secondary | ICD-10-CM | POA: Diagnosis present

## 2017-01-02 DIAGNOSIS — E119 Type 2 diabetes mellitus without complications: Secondary | ICD-10-CM

## 2017-01-02 DIAGNOSIS — G9341 Metabolic encephalopathy: Secondary | ICD-10-CM | POA: Diagnosis not present

## 2017-01-02 DIAGNOSIS — I749 Embolism and thrombosis of unspecified artery: Secondary | ICD-10-CM

## 2017-01-02 DIAGNOSIS — I350 Nonrheumatic aortic (valve) stenosis: Secondary | ICD-10-CM | POA: Diagnosis present

## 2017-01-02 DIAGNOSIS — Z955 Presence of coronary angioplasty implant and graft: Secondary | ICD-10-CM

## 2017-01-02 DIAGNOSIS — Z9841 Cataract extraction status, right eye: Secondary | ICD-10-CM

## 2017-01-02 DIAGNOSIS — G459 Transient cerebral ischemic attack, unspecified: Secondary | ICD-10-CM

## 2017-01-02 DIAGNOSIS — E871 Hypo-osmolality and hyponatremia: Secondary | ICD-10-CM | POA: Diagnosis present

## 2017-01-02 DIAGNOSIS — R5383 Other fatigue: Secondary | ICD-10-CM | POA: Diagnosis not present

## 2017-01-02 DIAGNOSIS — E1159 Type 2 diabetes mellitus with other circulatory complications: Secondary | ICD-10-CM | POA: Diagnosis present

## 2017-01-02 DIAGNOSIS — E785 Hyperlipidemia, unspecified: Secondary | ICD-10-CM | POA: Diagnosis not present

## 2017-01-02 DIAGNOSIS — R410 Disorientation, unspecified: Secondary | ICD-10-CM | POA: Diagnosis not present

## 2017-01-02 DIAGNOSIS — R0789 Other chest pain: Secondary | ICD-10-CM

## 2017-01-02 DIAGNOSIS — A419 Sepsis, unspecified organism: Secondary | ICD-10-CM | POA: Diagnosis present

## 2017-01-02 DIAGNOSIS — D61818 Other pancytopenia: Secondary | ICD-10-CM | POA: Diagnosis not present

## 2017-01-02 DIAGNOSIS — E1129 Type 2 diabetes mellitus with other diabetic kidney complication: Secondary | ICD-10-CM

## 2017-01-02 DIAGNOSIS — R0609 Other forms of dyspnea: Secondary | ICD-10-CM | POA: Diagnosis not present

## 2017-01-02 DIAGNOSIS — Z8673 Personal history of transient ischemic attack (TIA), and cerebral infarction without residual deficits: Secondary | ICD-10-CM

## 2017-01-02 DIAGNOSIS — Z79899 Other long term (current) drug therapy: Secondary | ICD-10-CM

## 2017-01-02 DIAGNOSIS — Z961 Presence of intraocular lens: Secondary | ICD-10-CM | POA: Diagnosis present

## 2017-01-02 DIAGNOSIS — R079 Chest pain, unspecified: Secondary | ICD-10-CM | POA: Diagnosis not present

## 2017-01-02 DIAGNOSIS — R404 Transient alteration of awareness: Secondary | ICD-10-CM | POA: Diagnosis not present

## 2017-01-02 DIAGNOSIS — Z8249 Family history of ischemic heart disease and other diseases of the circulatory system: Secondary | ICD-10-CM

## 2017-01-02 DIAGNOSIS — R161 Splenomegaly, not elsewhere classified: Secondary | ICD-10-CM | POA: Diagnosis present

## 2017-01-02 DIAGNOSIS — R809 Proteinuria, unspecified: Secondary | ICD-10-CM

## 2017-01-02 DIAGNOSIS — Z833 Family history of diabetes mellitus: Secondary | ICD-10-CM

## 2017-01-02 DIAGNOSIS — Z66 Do not resuscitate: Secondary | ICD-10-CM | POA: Diagnosis present

## 2017-01-02 DIAGNOSIS — Z87891 Personal history of nicotine dependence: Secondary | ICD-10-CM

## 2017-01-02 DIAGNOSIS — R008 Other abnormalities of heart beat: Secondary | ICD-10-CM | POA: Diagnosis present

## 2017-01-02 DIAGNOSIS — Z7902 Long term (current) use of antithrombotics/antiplatelets: Secondary | ICD-10-CM

## 2017-01-02 DIAGNOSIS — Z794 Long term (current) use of insulin: Secondary | ICD-10-CM

## 2017-01-02 DIAGNOSIS — R531 Weakness: Secondary | ICD-10-CM | POA: Diagnosis not present

## 2017-01-02 DIAGNOSIS — I700 Atherosclerosis of aorta: Secondary | ICD-10-CM | POA: Diagnosis not present

## 2017-01-02 DIAGNOSIS — Z9842 Cataract extraction status, left eye: Secondary | ICD-10-CM

## 2017-01-02 LAB — COMPREHENSIVE METABOLIC PANEL
ALK PHOS: 101 U/L (ref 38–126)
ALT: 31 U/L (ref 17–63)
AST: 29 U/L (ref 15–41)
Albumin: 3 g/dL — ABNORMAL LOW (ref 3.5–5.0)
Anion gap: 9 (ref 5–15)
BUN: 21 mg/dL — AB (ref 6–20)
CALCIUM: 8.5 mg/dL — AB (ref 8.9–10.3)
CHLORIDE: 97 mmol/L — AB (ref 101–111)
CO2: 22 mmol/L (ref 22–32)
CREATININE: 1.14 mg/dL (ref 0.61–1.24)
GFR, EST NON AFRICAN AMERICAN: 56 mL/min — AB (ref >60)
Glucose, Bld: 251 mg/dL — ABNORMAL HIGH (ref 65–99)
Potassium: 4.3 mmol/L (ref 3.5–5.1)
Sodium: 128 mmol/L — ABNORMAL LOW (ref 135–145)
Total Bilirubin: 1.3 mg/dL — ABNORMAL HIGH (ref 0.3–1.2)
Total Protein: 5.8 g/dL — ABNORMAL LOW (ref 6.5–8.1)

## 2017-01-02 LAB — URINALYSIS, ROUTINE W REFLEX MICROSCOPIC
BILIRUBIN URINE: NEGATIVE
Ketones, ur: 5 mg/dL — AB
LEUKOCYTES UA: NEGATIVE
NITRITE: NEGATIVE
PH: 6 (ref 5.0–8.0)
Protein, ur: 100 mg/dL — AB
SPECIFIC GRAVITY, URINE: 1.023 (ref 1.005–1.030)
Squamous Epithelial / LPF: NONE SEEN

## 2017-01-02 LAB — GLUCOSE, POCT (MANUAL RESULT ENTRY): POC Glucose: 316 mg/dl — AB (ref 70–99)

## 2017-01-02 LAB — CBC WITH DIFFERENTIAL/PLATELET
BASOS PCT: 1 %
Basophils Absolute: 0.1 10*3/uL (ref 0.0–0.1)
EOS ABS: 0 10*3/uL (ref 0.0–0.7)
EOS PCT: 0 %
HCT: 34.8 % — ABNORMAL LOW (ref 39.0–52.0)
HEMOGLOBIN: 12.1 g/dL — AB (ref 13.0–17.0)
LYMPHS ABS: 2.6 10*3/uL (ref 0.7–4.0)
Lymphocytes Relative: 56 %
MCH: 29.2 pg (ref 26.0–34.0)
MCHC: 34.8 g/dL (ref 30.0–36.0)
MCV: 83.9 fL (ref 78.0–100.0)
MONOS PCT: 12 %
Monocytes Absolute: 0.6 10*3/uL (ref 0.1–1.0)
NEUTROS PCT: 31 %
Neutro Abs: 1.5 10*3/uL — ABNORMAL LOW (ref 1.7–7.7)
PLATELETS: 122 10*3/uL — AB (ref 150–400)
RBC: 4.15 MIL/uL — ABNORMAL LOW (ref 4.22–5.81)
RDW: 15.7 % — ABNORMAL HIGH (ref 11.5–15.5)
WBC: 4.8 10*3/uL (ref 4.0–10.5)

## 2017-01-02 LAB — CBG MONITORING, ED: Glucose-Capillary: 252 mg/dL — ABNORMAL HIGH (ref 65–99)

## 2017-01-02 LAB — D-DIMER, QUANTITATIVE (NOT AT ARMC): D DIMER QUANT: 2.11 ug{FEU}/mL — AB (ref 0.00–0.50)

## 2017-01-02 LAB — TROPONIN I

## 2017-01-02 LAB — BRAIN NATRIURETIC PEPTIDE: B Natriuretic Peptide: 19.8 pg/mL (ref 0.0–100.0)

## 2017-01-02 LAB — I-STAT CG4 LACTIC ACID, ED: Lactic Acid, Venous: 1.76 mmol/L (ref 0.5–1.9)

## 2017-01-02 MED ORDER — SODIUM CHLORIDE 0.9 % IV BOLUS (SEPSIS)
1000.0000 mL | Freq: Once | INTRAVENOUS | Status: AC
  Administered 2017-01-02: 1000 mL via INTRAVENOUS

## 2017-01-02 MED ORDER — IOPAMIDOL (ISOVUE-370) INJECTION 76%
INTRAVENOUS | Status: AC
  Administered 2017-01-02: 100 mL
  Filled 2017-01-02: qty 100

## 2017-01-02 MED ORDER — ACETAMINOPHEN 500 MG PO TABS
1000.0000 mg | ORAL_TABLET | Freq: Once | ORAL | Status: AC
  Administered 2017-01-02: 1000 mg via ORAL
  Filled 2017-01-02: qty 2

## 2017-01-02 NOTE — ED Notes (Signed)
MD at bedside. 

## 2017-01-02 NOTE — Progress Notes (Signed)
New patient office visit note:  Impression and Recommendations:    1. Shortness of breath   2. Chest tightness or pressure   3. DOE (dyspnea on exertion)   4. Coronary artery disease involving native coronary artery of native heart, angina presence unspecified   5. Transient cerebral ischemia, unspecified type   6. Diabetes mellitus without complication ([place])   7. Essential hypertension   8. Hyperlipidemia, unspecified hyperlipidemia type    83 year old patient presents to my clinic today as a new patient with significant complaints of  shortness of breath, chest tightness and dyspnea on exertion over the past 5 days - now acutely worse last 24 hrs.  Symptoms significant since last evening.    - Discussed with 2 sons that are extremely worried about father's acute onset of these new sx (as per what they tell me), due to his significant past medical history ( H/o CVA- 12-15 yrs ago, coronary stent placed 8/16- significant cardiovascular medical history) a trip to the emergency room to rule out cardiovascular event / rule out PE etc is prudent at this time.    - Vitals are stable and sons deny h/o anxiety/ panic.  Patient desats to only 95 upon ambulation around the office.   As mentioned, patient is new to me and I do not know what his baseline affect is, but per his son's, this is not anxiety or excess worry.   - Certified medical assistant Larene Beach at patient's bedside until EMS arrived.  Transfer of care given to them.   The patient and two sons were counseled, risk factors were discussed, anticipatory guidance given.   Orders Placed This Encounter  Procedures  . POCT Glucose (CBG)    Return for Follow-up after emergency room visit and/or hospitalization for new patient visit with [name].  Please see AVS handed out to patient at the end of our visit for further patient instructions/ counseling done pertaining to today's office visit.    Note: This document was prepared using  Dragon voice recognition software and may include unintentional dictation errors.  ----------------------------------------------------------------------------------------------------------------------    Subjective:    Chief complaint:   Chief Complaint  Patient presents with  . Establish Care  . Shortness of Breath    Pt states this has been going on since last saturday, slight chest tightness, some wheezing, coughing mainly non productive      HPI: Seth Abbott is a pleasant 83 y.o. male who presents to [place] at [facility] today to review their medical history with me and establish care.   Cardiologist--->  Traci Turner Endo- Dr Buddy Duty Prior PCP- at [facility].   Acutely having excessive tiredness with exertion and SOB along with chest tightness since Sat.   But his SOB has gotten progressively worse- peaking last night and today.   Mild new swelling in legs. Sx worse with exertion.  No change with lying flat vs sitting up.   He denies any heart palpitations.  He denies any new dizziness or visual changes.  Denies feeling anxious or having history of panic attacks.  He denies any nausea vomiting diarrhea or shoulder pain, or back pain.   -  H/o CVA- 12-15 yrs, coronary stent placed 8/16- significant cardiovascular medical history.    -  Patient is here with his 2 sons at office visit today.  They have significant concerns for how their father looks and is acting.  They say this is new and he's never been  like this in the past.     Wt Readings from Last 3 Encounters:  01/02/17 183 lb 14.4 oz (83.4 kg)  11/10/16 178 lb 9.6 oz (81 kg)  07/17/16 189 lb (85.7 kg)   BP Readings from Last 3 Encounters:  01/02/17 110/68  11/10/16 132/70  07/17/16 134/68   Pulse Readings from Last 3 Encounters:  01/02/17 87  11/10/16 70  07/17/16 78   BMI Readings from Last 3 Encounters:  01/02/17 31.57 kg/m  11/10/16 28.83 kg/m  07/17/16 30.51 kg/m    Patient Care Team     Relationship Specialty Notifications Start End  [name], DO PCP - General Family Medicine  01/02/17   Sueanne Margarita, MD PCP - Cardiology Cardiology  10/12/13    Comment: Merged    Patient Active Problem List   Diagnosis Date Noted  . Abnormal PFTs (pulmonary function tests) 08/16/2015  . CAD (coronary artery disease), native coronary artery 02/09/2015  . Coronary artery calcification seen on CAT scan   . Aortic stenosis, mild   . Dilated aortic root ([employer])   . Diabetes ([place])   . TIA (transient ischemic attack)   . HTN (hypertension)   . Prostatitis   . Shingles   . Hyperlipidemia      Past Medical History:  Diagnosis Date  . Aortic stenosis, mild    a. By echo 12/2013.  [name] Arthritis    "mostly in my hands" (02/09/2015)  . CAD (coronary artery disease)    a. 12/2014 cath showed 95% prox RCA and diffuse calcified prox to distal LAD disease up to 70% at multiple spots. S/p rotational atherectomy/DES to prox RCA 02/09/15.  . Dilated aortic root ([place])   . GERD (gastroesophageal reflux disease)   . HTN (hypertension)   . Hyperlipidemia   . Prostatitis   . Shingles   . TIA (transient ischemic attack) early 1990's; 09/2006   [name] 10/26/2010  . Type II diabetes mellitus ([place])      Past Medical History:  Diagnosis Date  . Aortic stenosis, mild    a. By echo 12/2013.  [name] Arthritis    "mostly in my hands" (02/09/2015)  . CAD (coronary artery disease)    a. 12/2014 cath showed 95% prox RCA and diffuse calcified prox to distal LAD disease up to 70% at multiple spots. S/p rotational atherectomy/DES to prox RCA 02/09/15.  . Dilated aortic root ([place])   . GERD (gastroesophageal reflux disease)   . HTN (hypertension)   . Hyperlipidemia   . Prostatitis   . Shingles   . TIA (transient ischemic attack) early 1990's; 09/2006   [name] 10/26/2010  . Type II diabetes mellitus ([place])      Past Surgical History:  Procedure Laterality Date  . CARDIAC CATHETERIZATION N/A 12/11/2014    Procedure: Left Heart Cath and Coronary Angiography;  Surgeon: Belva Crome, MD;  Location: [place] [facility];  Service: Cardiovascular;  Laterality: N/A;  . CARDIAC CATHETERIZATION N/A 02/09/2015   Procedure: Coronary Stent Intervention-Rotoblator, temp pacer;  Surgeon: Belva Crome, MD;  Location: [place] [facility];  Service: Cardiovascular;  Laterality: N/A;  . CATARACT EXTRACTION W/ INTRAOCULAR LENS  IMPLANT, BILATERAL Bilateral   . EXCISIONAL HEMORRHOIDECTOMY  1957  . EYE SURGERY    . MEMBRANE PEEL Left 01/11/2015   eye     Family History  Problem Relation Age of Onset  . Diabetes Mother   . Heart attack Father   . Diabetes Father   .  CAD Father   . Diabetes Sister   . COPD Brother   . Diabetes Sister      History  Drug Use No     History  Alcohol Use  . 0.0 oz/week    Comment: 02/09/2015 "might have a sip of wne a dozen times/yr"     History  Smoking Status  . Former Smoker  . Types: Cigarettes  Smokeless Tobacco  . Never Used    Comment: "haven't smoked since I was 15" tried it once     Outpatient Encounter Prescriptions as of 01/02/2017  Medication Sig Note  . ACCU-CHEK AVIVA PLUS test strip USE TO CHECK BLOOD SUGAR 3 TIMES DAILY   . aspirin 81 MG tablet Take 81 mg by mouth every morning.    [name] atorvastatin (LIPITOR) 20 MG tablet Take 20 mg by mouth every morning.    . clopidogrel (PLAVIX) 75 MG tablet Take 1 tablet (75 mg total) by mouth daily.   . insulin lispro (HUMALOG) 100 UNIT/ML cartridge Inject 10 Units into the skin 2 (two) times daily.    . insulin NPH Human (HUMULIN N,NOVOLIN N) 100 UNIT/ML injection Inject 20 Units into the skin 2 (two) times daily before a meal.   . isosorbide mononitrate (IMDUR) 30 MG 24 hr tablet TAKE 0.5 TABLETS (15 MG TOTAL) BY MOUTH DAILY.   . Melatonin 3 MG TABS Take 3 mg by mouth at bedtime as needed.   . metoprolol succinate (TOPROL-XL) 25 MG 24 hr tablet TAKE 1 TABLET (25 MG TOTAL) BY MOUTH DAILY. TAKE WITH OR  IMMEDIATELY FOLLOWING A MEAL.   . Multiple Vitamins-Minerals (MULTIVITAMIN WITH MINERALS) tablet Take 1 tablet by mouth daily.   . nitroGLYCERIN (NITROSTAT) 0.4 MG SL tablet Place 1 tablet (0.4 mg total) under the tongue every 5 (five) minutes as needed for chest pain (up to 3 doses).   . pantoprazole (PROTONIX) 20 MG tablet TAKE 1 TABLET BY MOUTH EVERY DAY   . ramipril (ALTACE) 2.5 MG capsule Take 2.5 mg by mouth daily. 02/08/2015: .  [name] [DISCONTINUED] SERTRALINE HCL PO Take by mouth daily. As directed. Pt unsure of strength/dose    No facility-administered encounter medications on file as of 01/02/2017.     Allergies: Patient has no known allergies.   Review of Systems  Constitutional: Positive for malaise/fatigue. Negative for chills, fever and weight loss.  HENT: Negative for congestion, nosebleeds and sinus pain.   Eyes: Negative for blurred vision and double vision.  Respiratory: Positive for shortness of breath. Negative for wheezing.   Cardiovascular: Positive for chest pain and leg swelling. Negative for palpitations, orthopnea and claudication.       Complains of chest pressure with exertion  Gastrointestinal: Negative for abdominal pain, diarrhea, heartburn, nausea and vomiting.       Chronic GERD  Genitourinary: Negative for frequency and hematuria.  Musculoskeletal: Negative for falls, joint pain and myalgias.       Chronic jt pains  Skin: Negative for rash.  Neurological: Positive for weakness. Negative for dizziness, sensory change and focal weakness.  Endo/Heme/Allergies: Negative for polydipsia.  Psychiatric/Behavioral: Negative for depression and suicidal ideas. The patient is not nervous/anxious.      Objective:   Blood pressure 110/68, pulse 87, height 5\' 4"  (1.626 m), weight 183 lb 14.4 oz (83.4 kg), SpO2 99 %. Body mass index is 31.57 kg/m. General: Well Developed, well nourished, and in no acute distress.  Neuro: Alert and oriented x3, extra-ocular muscles  intact, sensation grossly  intact.  HEENT:[place]/AT, PERRLA, neck supple, No carotid bruits Skin: no gross rashes  Cardiac: S1, S2 distant very distant, but ess Regular rate and rhythm; no tenderness to palpation rib cage or retrosternal region Respiratory: Distant but Essentially clear to auscultation bilaterally. Not using accessory muscles, Has conversational dyspnea  Abdominal: not grossly distended Musculoskeletal: Ambulates w/o diff, FROM * 4 ext.  Vasc: less 2 sec cap RF, 2+ peripheral edema b/l, no calf swelling/ ertyhema, warm and pink ext Psych:  No HI/SI, judgement and insight good, Euthymic mood. Full Affect.    Recent Results (from the past 2160 hour(s))  POCT Glucose (CBG)     Status: Abnormal   Collection Time: 01/02/17  4:28 PM  Result Value Ref Range   POC Glucose 316 (A) 70 - 99 mg/dl

## 2017-01-02 NOTE — ED Provider Notes (Addendum)
[place] DEPT Provider Note   CSN: [id number] Arrival date & time: 01/02/17  1804     History   Chief Complaint Chief Complaint  Patient presents with  . Fatigue  . Hyperglycemia    HPI Seth Abbott is a 83 y.o. male.  The history is provided by the patient.  Shortness of Breath  This is a recurrent problem. Duration: several yrs. worsened over the last several days. Episode frequency: usually intermittent, but has become more frequent.Episode onset: 5 days. The problem has been gradually worsening. Associated symptoms include rhinorrhea, chest pain (pressure and discomfort. intermittent. nonexertional. nonradiating. ) and leg swelling. Pertinent negatives include no fever, no cough, no sputum production and no orthopnea. Precipitated by: activity. Associated medical issues include CAD. Associated medical issues do not include asthma, COPD, PE, heart failure, DVT or recent surgery.    Past Medical History:  Diagnosis Date  . Aortic stenosis, mild    a. By echo 12/2013.  [name] Arthritis    "mostly in my hands" (02/09/2015)  . CAD (coronary artery disease)    a. 12/2014 cath showed 95% prox RCA and diffuse calcified prox to distal LAD disease up to 70% at multiple spots. S/p rotational atherectomy/DES to prox RCA 02/09/15.  . Dilated aortic root ([place])   . GERD (gastroesophageal reflux disease)   . HTN (hypertension)   . Hyperlipidemia   . Prostatitis   . Shingles   . TIA (transient ischemic attack) early 1990's; 09/2006   [name] 10/26/2010  . Type II diabetes mellitus [facility])     Patient Active Problem List   Diagnosis Date Noted  . Abnormal PFTs (pulmonary function tests) 08/16/2015  . CAD (coronary artery disease), native coronary artery 02/09/2015  . Coronary artery calcification seen on CAT scan   . Aortic stenosis, mild   . Dilated aortic root ([place])   . Diabetes ([place])   . TIA (transient ischemic attack)   . HTN (hypertension)   . Prostatitis   . Shingles   .  Hyperlipidemia     Past Surgical History:  Procedure Laterality Date  . CARDIAC CATHETERIZATION N/A 12/11/2014   Procedure: Left Heart Cath and Coronary Angiography;  Surgeon: Belva Crome, MD;  Location: [place] [facility];  Service: Cardiovascular;  Laterality: N/A;  . CARDIAC CATHETERIZATION N/A 02/09/2015   Procedure: Coronary Stent Intervention-Rotoblator, temp pacer;  Surgeon: Belva Crome, MD;  Location: [place] [facility];  Service: Cardiovascular;  Laterality: N/A;  . CATARACT EXTRACTION W/ INTRAOCULAR LENS  IMPLANT, BILATERAL Bilateral   . EXCISIONAL HEMORRHOIDECTOMY  1957  . EYE SURGERY    . MEMBRANE PEEL Left 01/11/2015   eye       Home Medications    Prior to Admission medications   Medication Sig Start Date End Date Taking? Authorizing Provider  ACCU-CHEK AVIVA PLUS test strip USE TO CHECK BLOOD SUGAR 3 TIMES DAILY 02/21/16  Yes [provider]  aspirin 81 MG tablet Take 81 mg by mouth every morning.    Yes [provider]  atorvastatin (LIPITOR) 20 MG tablet Take 20 mg by mouth every morning.    Yes [provider]  clopidogrel (PLAVIX) 75 MG tablet Take 1 tablet (75 mg total) by mouth daily. 01/04/16  Yes Turner, Eber Hong, MD  insulin lispro (HUMALOG) 100 UNIT/ML cartridge Inject 10-15 Units into the skin 2 (two) times daily. Pt ranges his insulin with his blood sugar readings   Yes [provider]  insulin NPH Human (  HUMULIN N,NOVOLIN N) 100 UNIT/ML injection Inject 25 Units into the skin 2 (two) times daily before a meal.    Yes [provider]  isosorbide mononitrate (IMDUR) 30 MG 24 hr tablet TAKE 0.5 TABLETS (15 MG TOTAL) BY MOUTH DAILY. 08/07/16  Yes Turner, Eber Hong, MD  Melatonin 3 MG TABS Take 3 mg by mouth at bedtime as needed.   Yes [provider]  metoprolol succinate (TOPROL-XL) 25 MG 24 hr tablet TAKE 1 TABLET (25 MG TOTAL) BY MOUTH DAILY. TAKE WITH OR IMMEDIATELY FOLLOWING A MEAL. 10/05/16  Yes Turner, Eber Hong, MD  Multiple Vitamins-Minerals (MULTIVITAMIN WITH MINERALS) tablet Take 1 tablet by mouth daily.   Yes [provider]  pantoprazole (PROTONIX) 20 MG tablet TAKE 1 TABLET BY MOUTH EVERY DAY 07/07/16  Yes Turner, Traci R, MD  ramipril (ALTACE) 2.5 MG capsule Take 2.5 mg by mouth daily. 10/22/14  Yes [provider]  nitroGLYCERIN (NITROSTAT) 0.4 MG SL tablet Place 1 tablet (0.4 mg total) under the tongue every 5 (five) minutes as needed for chest pain (up to 3 doses). 02/10/15   Charlie Pitter, PA-C    Family History Family History  Problem Relation Age of Onset  . Diabetes Mother   . Heart attack Father   . Diabetes Father   . CAD Father   . Diabetes Sister   . COPD Brother   . Diabetes Sister     Social History Social History  Substance Use Topics  . Smoking status: Former Smoker    Types: Cigarettes  . Smokeless tobacco: Never Used     Comment: "haven't smoked since I was 15" tried it once  . Alcohol use 0.0 oz/week     Comment: 02/09/2015 "might have a sip of wne a dozen times/yr"     Allergies   Patient has no known allergies.   Review of Systems Review of Systems  Constitutional: Negative for fever.  HENT: Positive for rhinorrhea.   Respiratory: Positive for shortness of breath. Negative for cough and sputum production.   Cardiovascular: Positive for chest pain (pressure and discomfort. intermittent. nonexertional. nonradiating. ) and leg swelling. Negative for orthopnea.  All other systems are reviewed and are negative for acute change except as noted in the HPI    Physical Exam Updated Vital Signs BP 104/68   Pulse 82   Temp 97.8 F (36.6 C) (Oral)   Resp (!) 21   SpO2 100%   Physical Exam  Constitutional: He appears well-developed and well-nourished. No distress.  HENT:  Head: Normocephalic and atraumatic.  Nose: Nose normal.  Eyes: Pupils are equal, round, and reactive to light. Conjunctivae and EOM are normal. Right eye exhibits no  discharge. Left eye exhibits no discharge. No scleral icterus.  Neck: Normal range of motion. Neck supple. No JVD present.  Cardiovascular: Normal rate and regular rhythm.  Exam reveals no gallop and no friction rub.   Murmur heard.  Systolic (radiating to carotids) murmur is present  Pulmonary/Chest: Effort normal and breath sounds normal. No stridor. No respiratory distress. He has no rales.  Abdominal: Soft. He exhibits no distension. There is no tenderness.  Musculoskeletal: He exhibits no edema or tenderness.  1+ BLE pitting edema  Neurological: He is alert. He is disoriented (mildly confused; unable to give the year.).  Skin: Skin is warm and dry. No rash noted. He is not diaphoretic. No erythema.  Psychiatric: He has a normal mood and affect.  Vitals reviewed.  ED Treatments / Results  Labs (all labs ordered are listed, but only abnormal results are displayed) Labs Reviewed  URINALYSIS, ROUTINE W REFLEX MICROSCOPIC - Abnormal; Notable for the following:       Result Value   Glucose, UA >=500 (*)    Hgb urine dipstick SMALL (*)    Ketones, ur 5 (*)    Protein, ur 100 (*)    Bacteria, UA RARE (*)    All other components within normal limits  COMPREHENSIVE METABOLIC PANEL - Abnormal; Notable for the following:    Sodium 128 (*)    Chloride 97 (*)    Glucose, Bld 251 (*)    BUN 21 (*)    Calcium 8.5 (*)    Total Protein 5.8 (*)    Albumin 3.0 (*)    Total Bilirubin 1.3 (*)    GFR calc non Af Amer 56 (*)    All other components within normal limits  CBC WITH DIFFERENTIAL/PLATELET - Abnormal; Notable for the following:    RBC 4.15 (*)    Hemoglobin 12.1 (*)    HCT 34.8 (*)    RDW 15.7 (*)    Platelets 122 (*)    Neutro Abs 1.5 (*)    All other components within normal limits  D-DIMER, QUANTITATIVE (NOT AT [facility]) - Abnormal; Notable for the following:    D-Dimer, Quant 2.11 (*)    All other components within normal limits  CBG MONITORING, ED - Abnormal; Notable for  the following:    Glucose-Capillary 252 (*)    All other components within normal limits  TROPONIN I  BRAIN NATRIURETIC PEPTIDE  I-STAT CG4 LACTIC ACID, ED    EKG  EKG Interpretation  Date/Time:  Tuesday January 02 2017 19:06:05 EDT Ventricular Rate:  76 PR Interval:    QRS Duration: 130 QT Interval:  398 QTC Calculation: 448 R Axis:   91 Text Interpretation:  Sinus rhythm Right bundle branch block NO STEMI Otherwise no significant change Confirmed by Addison Lank [phone]) on 01/02/2017 7:28:58 PM       Radiology Dg Chest 2 View  Result Date: 01/02/2017 CLINICAL DATA:  83 year old male with history of generalize weakness and hyperglycemia. EXAM: CHEST  2 VIEW COMPARISON:  Chest x-ray 10/12/2013. FINDINGS: Lung volumes are normal. No consolidative airspace disease. No pleural effusions. No pneumothorax. No pulmonary nodule or mass noted. Pulmonary vasculature and the cardiomediastinal silhouette are within normal limits. Aortic atherosclerosis. IMPRESSION: 1. No radiographic evidence of acute cardiopulmonary disease. 2. Aortic atherosclerosis. Electronically Signed   By: Vinnie Langton M.D.   On: 01/02/2017 20:08   Ct Head Wo Contrast  Result Date: 01/02/2017 CLINICAL DATA:  Confusion.  Generalized weakness. EXAM: CT HEAD WITHOUT CONTRAST TECHNIQUE: Contiguous axial images were obtained from the base of the skull through the vertex without intravenous contrast. COMPARISON:  None. FINDINGS: Brain: No evidence of acute infarction, hemorrhage, hydrocephalus, extra-axial collection or mass lesion/mass effect. Generalized atrophy, normal for age. Mild chronic small vessel ischemia for age. Vascular: Atherosclerosis of skullbase vasculature without hyperdense vessel or abnormal calcification. Skull: No skull fracture.  No focal lesion. Sinuses/Orbits: Trace mucosal thickening of ethmoid air cells. The mastoid air cells are clear. The visualized orbits are unremarkable. Other: None. IMPRESSION:  Generalized atrophy. Mild chronic small vessel ischemia. No acute intracranial abnormality. Electronically Signed   By: Jeb Levering M.D.   On: 01/02/2017 20:23    Procedures Procedures (including critical care time) CRITICAL CARE Performed by: Grayce Sessions Odyssey Vasbinder Total critical  care time: 40 minutes Critical care time was exclusive of separately billable procedures and treating other patients. Critical care was necessary to treat or prevent imminent or life-threatening deterioration. Critical care was time spent personally by me on the following activities: development of treatment plan with patient and/or surrogate as well as nursing, discussions with consultants, evaluation of patient's response to treatment, examination of patient, obtaining history from patient or surrogate, ordering and performing treatments and interventions, ordering and review of laboratory studies, ordering and review of radiographic studies, pulse oximetry and re-evaluation of patient's condition.   Medications Ordered in ED Medications  iopamidol (ISOVUE-370) 76 % injection (100 mLs  Contrast Given 01/02/17 2204)  sodium chloride 0.9 % bolus 1,000 mL (1,000 mLs Intravenous New Bag/Given 01/02/17 2236)  acetaminophen (TYLENOL) tablet 1,000 mg (1,000 mg Oral Given 01/02/17 2332)     Initial Impression / Assessment and Plan / ED Course  I have reviewed the triage vital signs and the nursing notes.  Pertinent labs & imaging results that were available during my care of the patient were reviewed by me and considered in my medical decision making (see chart for details).       1. SOB with DOE. Recent echo without evidence of systolic heart failure. Patient does have some mild bilateral pitting edema. Chest x-ray without evidence of pulmonary edema or pleural effusions. BNP within normal limits. Low suspicion for heart failure.  EKG without acute ischemic changes or evidence of pericarditis. Troponin  negative. Patient is high risk for ACS and will require additional troponin trending.  Low pretest probability for pulmonary embolism however unable to [facility]. Dimer positive. CTA negative for PE, PNA, edema, effusions.  CBC without anemia.  No evidence of infectious process.   2. Confusion noted on exam New per family. Patient also reporting headache. CT unremarkable. Labs revealed hyponatremia of 128 which may be contributing to patient's confusion. Started on normal saline IV fluids.  No other significant electrolyte derangements. Patient with hyperglycemia without evidence of DKA or HHS.  We'll admit the patient for ACS rule out and symptomatic hyponatremia.   Final Clinical Impressions(s) / ED Diagnoses   Final diagnoses:  SOB (shortness of breath)  Hyponatremia  Fatigue, unspecified type        Lynnie Koehler, Grayce Sessions, MD 01/03/17 0028

## 2017-01-02 NOTE — ED Notes (Signed)
CBG 252 

## 2017-01-02 NOTE — ED Triage Notes (Signed)
Per [facility]: Pt to ED from PCP office for further evaluation of symptoms - for the past week, patient has been experiencing generalized weakness, hyperglycemia (fluctuating over the last couple months) and some shortness of breath. Pt lives by himself and has noticed more fatigue and states his legs have been wobbly when he tries to get around. Pt denies fevers/chills, no N/V/D. Patient mentions that he did have one episode of CP last week, lasting 15 minutes but none since. Reports SOB improved after being placed on 4L O2 [place] by EMS - 100%. EMS VS: HR 76 NSR, 143/76, CBG 319. Patient is A&O x 4. Resp currently e/u, skin warm/dry.

## 2017-01-02 NOTE — ED Notes (Signed)
Patient transported to CT 

## 2017-01-03 ENCOUNTER — Inpatient Hospital Stay (HOSPITAL_COMMUNITY): Payer: Medicare Other

## 2017-01-03 ENCOUNTER — Encounter (HOSPITAL_COMMUNITY): Payer: Self-pay

## 2017-01-03 DIAGNOSIS — E785 Hyperlipidemia, unspecified: Secondary | ICD-10-CM

## 2017-01-03 DIAGNOSIS — E7800 Pure hypercholesterolemia, unspecified: Secondary | ICD-10-CM | POA: Diagnosis not present

## 2017-01-03 DIAGNOSIS — D61818 Other pancytopenia: Secondary | ICD-10-CM | POA: Diagnosis present

## 2017-01-03 DIAGNOSIS — Z8249 Family history of ischemic heart disease and other diseases of the circulatory system: Secondary | ICD-10-CM | POA: Diagnosis not present

## 2017-01-03 DIAGNOSIS — Z9841 Cataract extraction status, right eye: Secondary | ICD-10-CM | POA: Diagnosis not present

## 2017-01-03 DIAGNOSIS — E871 Hypo-osmolality and hyponatremia: Secondary | ICD-10-CM | POA: Diagnosis present

## 2017-01-03 DIAGNOSIS — Z66 Do not resuscitate: Secondary | ICD-10-CM | POA: Diagnosis present

## 2017-01-03 DIAGNOSIS — R0609 Other forms of dyspnea: Secondary | ICD-10-CM | POA: Diagnosis not present

## 2017-01-03 DIAGNOSIS — Z79899 Other long term (current) drug therapy: Secondary | ICD-10-CM | POA: Diagnosis not present

## 2017-01-03 DIAGNOSIS — Z9842 Cataract extraction status, left eye: Secondary | ICD-10-CM | POA: Diagnosis not present

## 2017-01-03 DIAGNOSIS — I350 Nonrheumatic aortic (valve) stenosis: Secondary | ICD-10-CM

## 2017-01-03 DIAGNOSIS — A419 Sepsis, unspecified organism: Secondary | ICD-10-CM | POA: Diagnosis not present

## 2017-01-03 DIAGNOSIS — Z961 Presence of intraocular lens: Secondary | ICD-10-CM | POA: Diagnosis present

## 2017-01-03 DIAGNOSIS — I10 Essential (primary) hypertension: Secondary | ICD-10-CM | POA: Diagnosis not present

## 2017-01-03 DIAGNOSIS — I25119 Atherosclerotic heart disease of native coronary artery with unspecified angina pectoris: Secondary | ICD-10-CM | POA: Diagnosis not present

## 2017-01-03 DIAGNOSIS — J40 Bronchitis, not specified as acute or chronic: Secondary | ICD-10-CM | POA: Diagnosis not present

## 2017-01-03 DIAGNOSIS — J9811 Atelectasis: Secondary | ICD-10-CM | POA: Diagnosis present

## 2017-01-03 DIAGNOSIS — G9341 Metabolic encephalopathy: Secondary | ICD-10-CM | POA: Diagnosis not present

## 2017-01-03 DIAGNOSIS — I25110 Atherosclerotic heart disease of native coronary artery with unstable angina pectoris: Secondary | ICD-10-CM

## 2017-01-03 DIAGNOSIS — Z955 Presence of coronary angioplasty implant and graft: Secondary | ICD-10-CM | POA: Diagnosis not present

## 2017-01-03 DIAGNOSIS — Z7982 Long term (current) use of aspirin: Secondary | ICD-10-CM | POA: Diagnosis not present

## 2017-01-03 DIAGNOSIS — Z87891 Personal history of nicotine dependence: Secondary | ICD-10-CM | POA: Diagnosis not present

## 2017-01-03 DIAGNOSIS — I2510 Atherosclerotic heart disease of native coronary artery without angina pectoris: Secondary | ICD-10-CM | POA: Diagnosis not present

## 2017-01-03 DIAGNOSIS — E119 Type 2 diabetes mellitus without complications: Secondary | ICD-10-CM | POA: Diagnosis not present

## 2017-01-03 DIAGNOSIS — R0602 Shortness of breath: Secondary | ICD-10-CM

## 2017-01-03 DIAGNOSIS — E1165 Type 2 diabetes mellitus with hyperglycemia: Secondary | ICD-10-CM | POA: Diagnosis present

## 2017-01-03 DIAGNOSIS — Z833 Family history of diabetes mellitus: Secondary | ICD-10-CM | POA: Diagnosis not present

## 2017-01-03 DIAGNOSIS — Z8673 Personal history of transient ischemic attack (TIA), and cerebral infarction without residual deficits: Secondary | ICD-10-CM | POA: Diagnosis not present

## 2017-01-03 DIAGNOSIS — Y92230 Patient room in hospital as the place of occurrence of the external cause: Secondary | ICD-10-CM | POA: Diagnosis not present

## 2017-01-03 DIAGNOSIS — R161 Splenomegaly, not elsewhere classified: Secondary | ICD-10-CM | POA: Diagnosis present

## 2017-01-03 DIAGNOSIS — I4510 Unspecified right bundle-branch block: Secondary | ICD-10-CM | POA: Diagnosis present

## 2017-01-03 DIAGNOSIS — I25118 Atherosclerotic heart disease of native coronary artery with other forms of angina pectoris: Secondary | ICD-10-CM | POA: Diagnosis present

## 2017-01-03 DIAGNOSIS — W19XXXA Unspecified fall, initial encounter: Secondary | ICD-10-CM | POA: Diagnosis not present

## 2017-01-03 DIAGNOSIS — R008 Other abnormalities of heart beat: Secondary | ICD-10-CM | POA: Diagnosis present

## 2017-01-03 DIAGNOSIS — E222 Syndrome of inappropriate secretion of antidiuretic hormone: Secondary | ICD-10-CM | POA: Diagnosis not present

## 2017-01-03 LAB — RESPIRATORY PANEL BY PCR
ADENOVIRUS-RVPPCR: NOT DETECTED
Bordetella pertussis: NOT DETECTED
CORONAVIRUS 229E-RVPPCR: NOT DETECTED
CORONAVIRUS HKU1-RVPPCR: NOT DETECTED
CORONAVIRUS NL63-RVPPCR: NOT DETECTED
CORONAVIRUS OC43-RVPPCR: NOT DETECTED
Chlamydophila pneumoniae: NOT DETECTED
INFLUENZA B-RVPPCR: NOT DETECTED
Influenza A: NOT DETECTED
MYCOPLASMA PNEUMONIAE-RVPPCR: NOT DETECTED
Metapneumovirus: NOT DETECTED
PARAINFLUENZA VIRUS 1-RVPPCR: NOT DETECTED
PARAINFLUENZA VIRUS 2-RVPPCR: NOT DETECTED
Parainfluenza Virus 3: NOT DETECTED
Parainfluenza Virus 4: NOT DETECTED
Respiratory Syncytial Virus: NOT DETECTED
Rhinovirus / Enterovirus: NOT DETECTED

## 2017-01-03 LAB — GLUCOSE, CAPILLARY
GLUCOSE-CAPILLARY: 184 mg/dL — AB (ref 65–99)
GLUCOSE-CAPILLARY: 257 mg/dL — AB (ref 65–99)
GLUCOSE-CAPILLARY: 320 mg/dL — AB (ref 65–99)
Glucose-Capillary: 226 mg/dL — ABNORMAL HIGH (ref 65–99)
Glucose-Capillary: 258 mg/dL — ABNORMAL HIGH (ref 65–99)
Glucose-Capillary: 183 mg/dL — ABNORMAL HIGH (ref 65–99)

## 2017-01-03 LAB — ECHOCARDIOGRAM COMPLETE
HEIGHTINCHES: 64 in
Weight: 2792 oz

## 2017-01-03 LAB — CBC
HCT: 34.4 % — ABNORMAL LOW (ref 39.0–52.0)
Hemoglobin: 11.8 g/dL — ABNORMAL LOW (ref 13.0–17.0)
MCH: 29.3 pg (ref 26.0–34.0)
MCHC: 34.3 g/dL (ref 30.0–36.0)
MCV: 85.4 fL (ref 78.0–100.0)
Platelets: 109 10*3/uL — ABNORMAL LOW (ref 150–400)
RBC: 4.03 MIL/uL — ABNORMAL LOW (ref 4.22–5.81)
RDW: 15.7 % — AB (ref 11.5–15.5)
WBC: 2.9 10*3/uL — ABNORMAL LOW (ref 4.0–10.5)

## 2017-01-03 LAB — OSMOLALITY: OSMOLALITY: 277 mosm/kg (ref 275–295)

## 2017-01-03 LAB — LACTIC ACID, PLASMA: Lactic Acid, Venous: 0.9 mmol/L (ref 0.5–1.9)

## 2017-01-03 LAB — SODIUM, URINE, RANDOM: SODIUM UR: 66 mmol/L

## 2017-01-03 LAB — TSH: TSH: 4.175 u[IU]/mL (ref 0.350–4.500)

## 2017-01-03 LAB — AMMONIA: Ammonia: 17 umol/L (ref 9–35)

## 2017-01-03 LAB — PROCALCITONIN: Procalcitonin: 0.22 ng/mL

## 2017-01-03 LAB — OSMOLALITY, URINE: Osmolality, Ur: 712 mOsm/kg (ref 300–900)

## 2017-01-03 LAB — STREP PNEUMONIAE URINARY ANTIGEN: STREP PNEUMO URINARY ANTIGEN: NEGATIVE

## 2017-01-03 MED ORDER — NITROGLYCERIN 0.4 MG SL SUBL: 0.4000 mg | SUBLINGUAL_TABLET | SUBLINGUAL | Status: DC | PRN

## 2017-01-03 MED ORDER — ATORVASTATIN CALCIUM 20 MG PO TABS
20.0000 mg | ORAL_TABLET | Freq: Every day | ORAL | Status: DC
  Administered 2017-01-03 – 2017-01-04 (×2): 20 mg via ORAL
  Filled 2017-01-03 (×2): qty 1

## 2017-01-03 MED ORDER — INSULIN ASPART 100 UNIT/ML ~~LOC~~ SOLN
0.0000 [IU] | Freq: Every day | SUBCUTANEOUS | Status: DC
  Administered 2017-01-04: 3 [IU] via SUBCUTANEOUS

## 2017-01-03 MED ORDER — ADULT MULTIVITAMIN W/MINERALS CH
1.0000 | ORAL_TABLET | Freq: Every day | ORAL | Status: DC
  Administered 2017-01-03 – 2017-01-05 (×2): 1 via ORAL
  Filled 2017-01-03 (×2): qty 1

## 2017-01-03 MED ORDER — ENOXAPARIN SODIUM 40 MG/0.4ML ~~LOC~~ SOLN
40.0000 mg | SUBCUTANEOUS | Status: DC
  Administered 2017-01-03: 40 mg via SUBCUTANEOUS
  Filled 2017-01-03: qty 0.4

## 2017-01-03 MED ORDER — PANTOPRAZOLE SODIUM 20 MG PO TBEC
20.0000 mg | DELAYED_RELEASE_TABLET | Freq: Every day | ORAL | Status: DC
  Administered 2017-01-03 – 2017-01-05 (×2): 20 mg via ORAL
  Filled 2017-01-03 (×2): qty 1

## 2017-01-03 MED ORDER — SODIUM CHLORIDE 0.9 % IV SOLN
INTRAVENOUS | Status: DC
  Administered 2017-01-03: 03:00:00 via INTRAVENOUS

## 2017-01-03 MED ORDER — ISOSORBIDE MONONITRATE ER 30 MG PO TB24
15.0000 mg | ORAL_TABLET | Freq: Every day | ORAL | Status: DC
  Administered 2017-01-03 – 2017-01-04 (×2): 15 mg via ORAL
  Filled 2017-01-03 (×2): qty 1

## 2017-01-03 MED ORDER — ONDANSETRON HCL 4 MG/2ML IJ SOLN: 4.0000 mg | Freq: Three times a day (TID) | INTRAMUSCULAR | Status: DC | PRN

## 2017-01-03 MED ORDER — INSULIN NPH (HUMAN) (ISOPHANE) 100 UNIT/ML ~~LOC~~ SUSP
18.0000 [IU] | Freq: Two times a day (BID) | SUBCUTANEOUS | Status: DC
  Administered 2017-01-03 – 2017-01-04 (×3): 18 [IU] via SUBCUTANEOUS
  Filled 2017-01-03: qty 10

## 2017-01-03 MED ORDER — PNEUMOCOCCAL VAC POLYVALENT 25 MCG/0.5ML IJ INJ
0.5000 mL | INJECTION | INTRAMUSCULAR | Status: DC
  Filled 2017-01-03: qty 0.5

## 2017-01-03 MED ORDER — DEXTROSE 5 % IV SOLN
1.0000 g | INTRAVENOUS | Status: DC
  Administered 2017-01-03 – 2017-01-04 (×2): 1 g via INTRAVENOUS
  Filled 2017-01-03 (×2): qty 10

## 2017-01-03 MED ORDER — INSULIN ASPART 100 UNIT/ML ~~LOC~~ SOLN
0.0000 [IU] | Freq: Three times a day (TID) | SUBCUTANEOUS | Status: DC
  Administered 2017-01-03: 11 [IU] via SUBCUTANEOUS
  Administered 2017-01-04: 2 [IU] via SUBCUTANEOUS
  Administered 2017-01-04: 3 [IU] via SUBCUTANEOUS
  Administered 2017-01-05: 5 [IU] via SUBCUTANEOUS
  Administered 2017-01-05: 8 [IU] via SUBCUTANEOUS

## 2017-01-03 MED ORDER — ASPIRIN 81 MG PO CHEW
81.0000 mg | CHEWABLE_TABLET | Freq: Every morning | ORAL | Status: DC
Start: 2017-01-03 — End: 2017-01-05
  Administered 2017-01-03 – 2017-01-05 (×3): 81 mg via ORAL
  Filled 2017-01-03 (×3): qty 1

## 2017-01-03 MED ORDER — METOPROLOL SUCCINATE ER 25 MG PO TB24
25.0000 mg | ORAL_TABLET | Freq: Every day | ORAL | Status: DC
  Administered 2017-01-03 – 2017-01-05 (×3): 25 mg via ORAL
  Filled 2017-01-03 (×3): qty 1

## 2017-01-03 MED ORDER — ALBUTEROL SULFATE (2.5 MG/3ML) 0.083% IN NEBU: 2.5000 mg | INHALATION_SOLUTION | RESPIRATORY_TRACT | Status: DC | PRN

## 2017-01-03 MED ORDER — INSULIN ASPART 100 UNIT/ML ~~LOC~~ SOLN
0.0000 [IU] | Freq: Three times a day (TID) | SUBCUTANEOUS | Status: DC
Start: 2017-01-03 — End: 2017-01-03
  Administered 2017-01-03: 3 [IU] via SUBCUTANEOUS
  Administered 2017-01-03: 5 [IU] via SUBCUTANEOUS

## 2017-01-03 MED ORDER — ACETAMINOPHEN 325 MG PO TABS: 650.0000 mg | ORAL_TABLET | Freq: Four times a day (QID) | ORAL | Status: DC | PRN

## 2017-01-03 MED ORDER — HYDRALAZINE HCL 20 MG/ML IJ SOLN: 5.0000 mg | INTRAMUSCULAR | Status: DC | PRN

## 2017-01-03 MED ORDER — MELATONIN 3 MG PO TABS
3.0000 mg | ORAL_TABLET | Freq: Every evening | ORAL | Status: DC | PRN
  Filled 2017-01-03: qty 1

## 2017-01-03 MED ORDER — CLOPIDOGREL BISULFATE 75 MG PO TABS
75.0000 mg | ORAL_TABLET | Freq: Every day | ORAL | Status: DC
  Administered 2017-01-03 – 2017-01-05 (×3): 75 mg via ORAL
  Filled 2017-01-03 (×3): qty 1

## 2017-01-03 MED ORDER — SODIUM CHLORIDE 0.9 % IV SOLN
INTRAVENOUS | Status: DC
  Administered 2017-01-03: 16:00:00 via INTRAVENOUS

## 2017-01-03 MED ORDER — SODIUM CHLORIDE 0.9 % IV BOLUS (SEPSIS)
1500.0000 mL | Freq: Once | INTRAVENOUS | Status: AC
  Administered 2017-01-03: 1500 mL via INTRAVENOUS

## 2017-01-03 MED ORDER — PNEUMOCOCCAL VAC POLYVALENT 25 MCG/0.5ML IJ INJ: 0.5000 mL | INJECTION | INTRAMUSCULAR | Status: DC

## 2017-01-03 MED ORDER — DM-GUAIFENESIN ER 30-600 MG PO TB12: 1.0000 | ORAL_TABLET | Freq: Two times a day (BID) | ORAL | Status: DC | PRN

## 2017-01-03 NOTE — ED Notes (Signed)
Admitting MD at bedside.

## 2017-01-03 NOTE — Progress Notes (Signed)
Spoke with md new  Orders are placed for Labs and speech Therapy to evaluate.

## 2017-01-03 NOTE — Progress Notes (Signed)
  Echocardiogram 2D Echocardiogram has been performed.  Bobbye Charleston 01/03/2017, 5:07 PM

## 2017-01-03 NOTE — H&P (Signed)
History and Physical    Seth Abbott YFV:[id number] DOB: 04/04/1928 DOA: 01/02/2017  Referring MD/NP/PA:   PCP: [name], DO   Patient coming from:  The patient is coming from home.  At baseline, pt is partially dependent for most of ADL.   Chief Complaint: Generalized weakness, altered mental status, shortness breath  HPI: Seth Abbott is a 83 y.o. male with medical history significant of hypertension, hyperlipidemia, diabetes mellitus, TIA, GERD, prostatitis, CAD, s/p of stent placement, aortic stenosis, who presents with generalized weakness, altered mental status and shortness of breath.  Per patient's son, patient has been having generalized weakness in the past several days. He has wobbly walking. No unilateral weakness, numbness or tingliness in extremities. No facial droop or slurred speech. No vision change or hearing loss. Patient has been confused. Patient has chronic shortness of breath, which has worsened in the past several days. Patient has mild cough with little mucus production. He had one episode of chest pain last week, which lasted for about 15 minutes, then resolved spontaneously. Currently no chest pain. Patient denies subjective fever or chills, but his temperature is 100.4 in ED. Patient does not have nausea, vomiting, diarrhea, abdominal pain, symptoms of UTI.  ED Course: pt was found to have positive D-dimer, WBC 4.8, lactic acid 1.76, negative troponin, BNP 19.8, negative urinalysis, creatinine 1.014, temperature 100.4, tachypnea, O2 sat 95% on room air, negative chest x-ray, negative CT head for acute intracranial abnormalities. CT angiogram of chest is negative for PE, but showed mild emphysema and incidental finding of mild splenomegaly. Patient is admitted to telemetry bed as inpatient.  Review of Systems: Could not be reviewed accurately due to altered mental status.  Allergy: No Known Allergies  Past Medical History:  Diagnosis Date  . Aortic  stenosis, mild    a. By echo 12/2013.  [name] Arthritis    "mostly in my hands" (02/09/2015)  . CAD (coronary artery disease)    a. 12/2014 cath showed 95% prox RCA and diffuse calcified prox to distal LAD disease up to 70% at multiple spots. S/p rotational atherectomy/DES to prox RCA 02/09/15.  . Dilated aortic root ([place])   . GERD (gastroesophageal reflux disease)   . HTN (hypertension)   . Hyperlipidemia   . Prostatitis   . Shingles   . TIA (transient ischemic attack) early 1990's; 09/2006   [name] 10/26/2010  . Type II diabetes mellitus ([place])     Past Surgical History:  Procedure Laterality Date  . CARDIAC CATHETERIZATION N/A 12/11/2014   Procedure: Left Heart Cath and Coronary Angiography;  Surgeon: Belva Crome, MD;  Location: [facility];  Service: Cardiovascular;  Laterality: N/A;  . CARDIAC CATHETERIZATION N/A 02/09/2015   Procedure: Coronary Stent Intervention-Rotoblator, temp pacer;  Surgeon: Belva Crome, MD;  Location: [facility];  Service: Cardiovascular;  Laterality: N/A;  . CATARACT EXTRACTION W/ INTRAOCULAR LENS  IMPLANT, BILATERAL Bilateral   . EXCISIONAL HEMORRHOIDECTOMY  1957  . EYE SURGERY    . MEMBRANE PEEL Left 01/11/2015   eye    Social History:  reports that he has quit smoking. His smoking use included Cigarettes. He has never used smokeless tobacco. He reports that he drinks alcohol. He reports that he does not use drugs.  Family History:  Family History  Problem Relation Age of Onset  . Diabetes Mother   . Heart attack Father   . Diabetes Father   . CAD Father   . Diabetes Sister   .  COPD Brother   . Diabetes Sister      Prior to Admission medications   Medication Sig Start Date End Date Taking? Authorizing Provider  ACCU-CHEK AVIVA PLUS test strip USE TO CHECK BLOOD SUGAR 3 TIMES DAILY 02/21/16  Yes [provider]  aspirin 81 MG tablet Take 81 mg by mouth every morning.    Yes [provider]  atorvastatin (LIPITOR) 20 MG  tablet Take 20 mg by mouth every morning.    Yes [provider]  clopidogrel (PLAVIX) 75 MG tablet Take 1 tablet (75 mg total) by mouth daily. 01/04/16  Yes Turner, Eber Hong, MD  insulin lispro (HUMALOG) 100 UNIT/ML cartridge Inject 10-15 Units into the skin 2 (two) times daily. Pt ranges his insulin with his blood sugar readings   Yes [provider]  insulin NPH Human (HUMULIN N,NOVOLIN N) 100 UNIT/ML injection Inject 25 Units into the skin 2 (two) times daily before a meal.    Yes [provider]  isosorbide mononitrate (IMDUR) 30 MG 24 hr tablet TAKE 0.5 TABLETS (15 MG TOTAL) BY MOUTH DAILY. 08/07/16  Yes Turner, Eber Hong, MD  Melatonin 3 MG TABS Take 3 mg by mouth at bedtime as needed.   Yes [provider]  metoprolol succinate (TOPROL-XL) 25 MG 24 hr tablet TAKE 1 TABLET (25 MG TOTAL) BY MOUTH DAILY. TAKE WITH OR IMMEDIATELY FOLLOWING A MEAL. 10/05/16  Yes Turner, Eber Hong, MD  Multiple Vitamins-Minerals (MULTIVITAMIN WITH MINERALS) tablet Take 1 tablet by mouth daily.   Yes [provider]  pantoprazole (PROTONIX) 20 MG tablet TAKE 1 TABLET BY MOUTH EVERY DAY 07/07/16  Yes Turner, Traci R, MD  ramipril (ALTACE) 2.5 MG capsule Take 2.5 mg by mouth daily. 10/22/14  Yes [provider]  nitroGLYCERIN (NITROSTAT) 0.4 MG SL tablet Place 1 tablet (0.4 mg total) under the tongue every 5 (five) minutes as needed for chest pain (up to 3 doses). 02/10/15   Charlie Pitter, PA-C    Physical Exam: Vitals:   01/03/17 0100 01/03/17 0115 01/03/17 0130 01/03/17 0204  BP: 117/63 (!) 123/51 (!) 116/54 (!) 143/58  Pulse: 84 83 77 81  Resp: 18 (!) 22 20 12   Temp:    98.4 F (36.9 C)  TempSrc:    Oral  SpO2: 96% 97% 96% 99%  Weight:    79.2 kg (174 lb 8 oz)  Height:    5\' 4"  (1.626 m)   General: Not in acute distress HEENT:       Eyes: PERRL, EOMI, no scleral icterus.       ENT: No discharge from the ears and nose, no pharynx injection, no tonsillar  enlargement.        Neck: No JVD, no bruit, no mass felt. Heme: No neck lymph node enlargement. Cardiac: O8/C1, RRR, 1/6 systolic murmurs, No gallops or rubs. Respiratory: No rales, wheezing, rhonchi or rubs. GI: Soft, nondistended, nontender, no rebound pain, no organomegaly, BS present. GU: No hematuria Ext: No pitting leg edema bilaterally. 2+DP/PT pulse bilaterally. Musculoskeletal: No joint deformities, No joint redness or warmth, no limitation of ROM in spin. Skin: No rashes.  Neuro: confused, oriented to person, but not to time and place. Cranial nerves II-XII grossly intact, moves all extremities.   Psych: Patient is not psychotic, no suicidal or hemocidal ideation.  Labs on Admission: I have personally reviewed following labs and imaging studies  CBC:  Recent Labs Lab 01/02/17 1932  WBC 4.8  NEUTROABS 1.5*  HGB 12.1*  HCT 34.8*  MCV 83.9  PLT 737*   Basic Metabolic Panel:  Recent Labs Lab 01/02/17 1932  NA 128*  K 4.3  CL 97*  CO2 22  GLUCOSE 251*  BUN 21*  CREATININE 1.14  CALCIUM 8.5*   GFR: Estimated Creatinine Clearance: 44.2 mL/min (by C-G formula based on SCr of 1.14 mg/dL). Liver Function Tests:  Recent Labs Lab 01/02/17 1932  AST 29  ALT 31  ALKPHOS 101  BILITOT 1.3*  PROT 5.8*  ALBUMIN 3.0*   No results for input(s): LIPASE, AMYLASE in the last 168 hours. No results for input(s): AMMONIA in the last 168 hours. Coagulation Profile: No results for input(s): INR, PROTIME in the last 168 hours. Cardiac Enzymes:  Recent Labs Lab 01/02/17 1932  TROPONINI <0.03   BNP (last 3 results) No results for input(s): PROBNP in the last 8760 hours. HbA1C: No results for input(s): HGBA1C in the last 72 hours. CBG:  Recent Labs Lab 01/02/17 1919 01/03/17 0248  GLUCAP 252* 184*   Lipid Profile: No results for input(s): CHOL, HDL, LDLCALC, TRIG, CHOLHDL, LDLDIRECT in the last 72 hours. Thyroid Function Tests: No results for input(s): TSH,  T4TOTAL, FREET4, T3FREE, THYROIDAB in the last 72 hours. Anemia Panel: No results for input(s): VITAMINB12, FOLATE, FERRITIN, TIBC, IRON, RETICCTPCT in the last 72 hours. Urine analysis:    Component Value Date/Time   COLORURINE YELLOW 01/02/2017 1940   APPEARANCEUR CLEAR 01/02/2017 1940   LABSPEC 1.023 01/02/2017 1940   PHURINE 6.0 01/02/2017 1940   GLUCOSEU >=500 (A) 01/02/2017 1940   HGBUR SMALL (A) 01/02/2017 1940   BILIRUBINUR NEGATIVE 01/02/2017 1940   KETONESUR 5 (A) 01/02/2017 1940   PROTEINUR 100 (A) 01/02/2017 1940   UROBILINOGEN 1.0 10/12/2013 1954   NITRITE NEGATIVE 01/02/2017 1940   LEUKOCYTESUR NEGATIVE 01/02/2017 1940   Sepsis Labs: @LABRCNTIP (procalcitonin:4,lacticidven:4) )No results found for this or any previous visit (from the past 240 hour(s)).   Radiological Exams on Admission: Dg Chest 2 View  Result Date: 01/02/2017 CLINICAL DATA:  83 year old male with history of generalize weakness and hyperglycemia. EXAM: CHEST  2 VIEW COMPARISON:  Chest x-ray 10/12/2013. FINDINGS: Lung volumes are normal. No consolidative airspace disease. No pleural effusions. No pneumothorax. No pulmonary nodule or mass noted. Pulmonary vasculature and the cardiomediastinal silhouette are within normal limits. Aortic atherosclerosis. IMPRESSION: 1. No radiographic evidence of acute cardiopulmonary disease. 2. Aortic atherosclerosis. Electronically Signed   By: Vinnie Langton M.D.   On: 01/02/2017 20:08   Ct Head Wo Contrast  Result Date: 01/02/2017 CLINICAL DATA:  Confusion.  Generalized weakness. EXAM: CT HEAD WITHOUT CONTRAST TECHNIQUE: Contiguous axial images were obtained from the base of the skull through the vertex without intravenous contrast. COMPARISON:  None. FINDINGS: Brain: No evidence of acute infarction, hemorrhage, hydrocephalus, extra-axial collection or mass lesion/mass effect. Generalized atrophy, normal for age. Mild chronic small vessel ischemia for age. Vascular:  Atherosclerosis of skullbase vasculature without hyperdense vessel or abnormal calcification. Skull: No skull fracture.  No focal lesion. Sinuses/Orbits: Trace mucosal thickening of ethmoid air cells. The mastoid air cells are clear. The visualized orbits are unremarkable. Other: None. IMPRESSION: Generalized atrophy. Mild chronic small vessel ischemia. No acute intracranial abnormality. Electronically Signed   By: Jeb Levering M.D.   On: 01/02/2017 20:23   Ct Angio Chest Pe W And/or Wo Contrast  Result Date: 01/02/2017 CLINICAL DATA:  83 year old with shortness of breath and chest pain. Generalized weakness. EXAM: CT ANGIOGRAPHY CHEST WITH CONTRAST TECHNIQUE: Multidetector  CT imaging of the chest was performed using the standard protocol during bolus administration of intravenous contrast. Multiplanar CT image reconstructions and MIPs were obtained to evaluate the vascular anatomy. CONTRAST:  70 cc Isovue 370 IV COMPARISON:  Radiograph earlier this day.  Chest CT 08/20/2015 FINDINGS: Cardiovascular: There are no filling defects within the pulmonary arteries to suggest pulmonary embolus. Atherosclerosis and tortuosity of the thoracic aorta. No dissection or aneurysm. There are coronary artery calcifications. Heart size is normal. No pericardial effusion. Mediastinum/Nodes: Shotty right hilar nodes, largest measuring 9 mm. No additional mediastinal adenopathy. The esophagus is decompressed, small hiatal hernia. Visualized thyroid gland is normal. Lungs/Pleura: Minimal emphysema. Biapical pleuroparenchymal scarring, right greater than left. Mild atelectasis at the left lung base. Tiny right middle lobe nodule image 99 series 6 is unchanged dating back to 2015 and considered benign. No confluent consolidation. No pulmonary edema. No pleural fluid. Upper Abdomen: Prominent spleen spanning 13 cm. Small hiatal hernia. Musculoskeletal: There are no acute or suspicious osseous abnormalities. Degenerative change in  the spine. Review of the MIP images confirms the above findings. IMPRESSION: 1. No pulmonary embolus. 2. Mild emphysema.  Shotty right hilar nodes, likely reactive. 3. Coronary artery calcifications and aortic atherosclerosis. 4. Incidental mild splenomegaly in the included upper abdomen. This is new from 56. Aortic Atherosclerosis (ICD10-I70.0) and Emphysema (ICD10-J43.9). Electronically Signed   By: Jeb Levering M.D.   On: 01/02/2017 22:36   Dg Chest Port 1 View  Result Date: 01/03/2017 CLINICAL DATA:  Shortness of breath tonight. EXAM: PORTABLE CHEST 1 VIEW COMPARISON:  CT 3 hours prior. FINDINGS: Lower lung volumes from prior exam. Unchanged heart size and mediastinal contours with atherosclerosis of the aortic arch. Increasing left basilar atelectasis. Bronchovascular crowding related to low lung volumes. No pleural effusion or pneumothorax. IMPRESSION: Increasing left basilar atelectasis. Electronically Signed   By: Jeb Levering M.D.   On: 01/03/2017 01:16     EKG: Independently reviewed. Sinus rhythm, QTC 447, low voltage, early R-wave progression.  Assessment/Plan Principal Problem:   Bronchitis Active Problems:   Diabetes mellitus without complication (HCC)   TIA (transient ischemic attack)   HTN (hypertension)   Hyperlipidemia   Aortic stenosis, mild   CAD (coronary artery disease), native coronary artery   SOB (shortness of breath)   Sepsis (HCC)   Acute metabolic encephalopathy   Hyponatremia  Possible Bronchitis and mild sepsis: Patient has worsening shortness of breath, fever and cough, chest x-ray negative for infiltration. CT angiogram is negative for PE. Clinically seems to have possible bronchitis. Patient meets criteria for sepsis with fever and tachypnea, lactate acid is normal. No leukocytosis. Currently hemodynamically stable.  - will admit to tele bed as inpt - IV Rocephin - Mucinex for cough  - prn Albuterol Nebs for SOB - Urine S. pneumococcal  antigen - Follow up blood culture x2, sputum culture and respiratory virus panel - will get Procalcitonin and trend lactic acid level per sepsis protocol - IVF: 2.5L of NS bolus in ED, followed by 100 mL per hour of NS  DM-II: Last A1c not on record. Paient is taking NPH insulin and Humalog at home -will decrease NPH insulin dose from 25-18 units twice a day  -SSI  Hyponatremia: Likely due to decreased oral intake. Use of ramipril may have also contributed partially. - Will check urine sodium, urine osmolality, serum osmolality. - check TSH - hold ramipril - IVF as above - check BMP in AM  CAD: s/p of stent. No CP -Continue  aspirin, Imdur, Lipitor and metoprolol -When necessary nitroglycerin  TIA: -continue Lipitor and aspirin  HTN: -hold ramipril due to hyponatremia -Continue metoprolol -IVF hydralazine when necessary  HLD: -lipitor  Acute metabolic encephalopathy: Etiology is not clear. CT head is negative for acute intracranial abnormalities. No focal neurological findings on physical examination, less likely to have stroke. Possibly due to multifactorial etiology, including hyponatremia, sepsis. -Frequent neuro check -Treat underlying issues as above -check Ammonia level  Incidental mild splenomegaly in the included upper abdomen by CTA of chest: -May need Ct of abdomen for further evaluation (since pt just had CAT, will not order CT-abd now)   DVT ppx: SQ Lovenox Code Status: DNR (I discussed with patient's sons who are the POA, and explained the meaning of CODE STATUS. Per her sons, patient would want to be DNR) Family Communication:   Yes, patient's two sons at bed side Disposition Plan:  Anticipate discharge back to previous home environment Consults called:  none Admission status:  Inpatient/tele     Date of Service 01/03/2017    Ivor Costa [facility] Pager [phone]  If 7PM-7AM, please contact night-coverage [email or web address] Password  [facility] 01/03/2017, 3:29 AM

## 2017-01-03 NOTE — Plan of Care (Signed)
Problem: Safety: Goal: Ability to remain free from injury will improve Outcome: Progressing Pt using call bell for assistance.   

## 2017-01-03 NOTE — Evaluation (Signed)
Physical Therapy Evaluation Patient Details Name: Seth Abbott MRN: [id number] DOB: 1928-05-21 Today's Date: 01/03/2017   History of Present Illness  83 year old male, PMH of HTN, HLD, TIA, GERD, CAD status post stent, presented with complaints of progressively worsening DOE of several weeks duration, chronic intermittent chest pain and transient confusion.   Clinical Impression  Patient presents with problems listed below.  Will benefit from acute PT to maximize functional mobility prior to discharge.  Patient was living alone pta, ambulating on his own with RW.  Today patient is requiring assist for mobility and safety.  Patient with decreased cognition impacting safety.  Recommend SNF at discharge for continued therapy with goal to return home.    Follow Up Recommendations SNF;Supervision/Assistance - 24 hour    Equipment Recommendations  None recommended by PT    Recommendations for Other Services       Precautions / Restrictions Precautions Precautions: Fall Restrictions Weight Bearing Restrictions: No      Mobility  Bed Mobility               General bed mobility comments: Patient sitting EOB as PT entered room.  Transfers Overall transfer level: Needs assistance Equipment used: Rolling walker (2 wheeled) Transfers: Sit to/from Stand Sit to Stand: Min assist         General transfer comment: Verbal cues for hand placement and safety.  Min assist to help patient slow down and move at more safe speed.  Patient kept trying to turn around with RW, when PT's instructions were to "stand still".    Ambulation/Gait             General Gait Details: Patient reports "very tired".  Declined ambulation today.  Stairs            Wheelchair Mobility    Modified Rankin (Stroke Patients Only)       Balance Overall balance assessment: Needs assistance Sitting-balance support: No upper extremity supported;Feet supported Sitting balance-Leahy Scale:  Fair     Standing balance support: Bilateral upper extremity supported Standing balance-Leahy Scale: Poor                               Pertinent Vitals/Pain Pain Assessment: No/denies pain    Home Living Family/patient expects to be discharged to:: Private residence Living Arrangements: Alone Available Help at Discharge: Family;Available PRN/intermittently (2 son's live nearby) Type of Home: House       Home Layout: One level Home Equipment: Walker - 2 wheels;Bedside commode      Prior Function Level of Independence: Independent with assistive device(s)         Comments: Uses RW for gait.  Per patient, takes showers standing.     Hand Dominance        Extremity/Trunk Assessment   Upper Extremity Assessment Upper Extremity Assessment: Overall WFL for tasks assessed    Lower Extremity Assessment Lower Extremity Assessment: Generalized weakness       Communication   Communication: No difficulties  Cognition Arousal/Alertness: Awake/alert Behavior During Therapy: Flat affect;Impulsive ("It's not a good day" Repeated this statement.) Overall Cognitive Status: Impaired/Different from baseline Area of Impairment: Orientation;Attention;Memory;Safety/judgement;Awareness                 Orientation Level: Disoriented to;Time;Situation Current Attention Level: Sustained Memory: Decreased short-term memory (Asking son about house and PLOF)   Safety/Judgement: Decreased awareness of safety     General Comments: Patient impulsive, starting  tasks before being prepared for them.  Patient reporting equipment that he has, and son states he does not have that equipment..      General Comments      Exercises     Assessment/Plan    PT Assessment Patient needs continued PT services  PT Problem List Decreased strength;Decreased activity tolerance;Decreased balance;Decreased mobility;Decreased cognition;Decreased knowledge of use of DME;Decreased  safety awareness;Cardiopulmonary status limiting activity       PT Treatment Interventions DME instruction;Gait training;Functional mobility training;Therapeutic activities;Therapeutic exercise;Cognitive remediation;Patient/family education    PT Goals (Current goals can be found in the Care Plan section)  Acute Rehab PT Goals Patient Stated Goal: None stated PT Goal Formulation: With patient/family Time For Goal Achievement: 01/17/17 Potential to Achieve Goals: Good    Frequency Min 3X/week   Barriers to discharge Decreased caregiver support Patient lives alone and has only prn assist.    Co-evaluation               AM-PAC PT "6 Clicks" Daily Activity  Outcome Measure Difficulty turning over in bed (including adjusting bedclothes, sheets and blankets)?: A Little Difficulty moving from lying on back to sitting on the side of the bed? : A Little Difficulty sitting down on and standing up from a chair with arms (e.g., wheelchair, bedside commode, etc,.)?: Total Help needed moving to and from a bed to chair (including a wheelchair)?: A Little Help needed walking in hospital room?: A Little Help needed climbing 3-5 steps with a railing? : A Lot 6 Click Score: 15    End of Session Equipment Utilized During Treatment: Gait belt Activity Tolerance: Patient limited by fatigue Patient left: in bed;with call bell/phone within reach;with family/visitor present (sitting EOB) Nurse Communication: Mobility status (Sitting EOB with family in room.) PT Visit Diagnosis: Unsteadiness on feet (R26.81);Other abnormalities of gait and mobility (R26.89);Muscle weakness (generalized) (M62.81)    Time: 1950-2010 PT Time Calculation (min) (ACUTE ONLY): 20 min   Charges:   PT Evaluation $PT Eval Moderate Complexity: 1 Procedure     PT G Codes:        Seth Abbott. Seth Abbott, [facility] Acute Rehab Services Pager [phone]   Despina Pole 01/03/2017, 8:31 PM

## 2017-01-03 NOTE — Consult Note (Signed)
Patient ID: Seth Abbott MRN: [id number], DOB/AGE: 81-Oct-1929   Admit date: 01/02/2017  Reason for Consult: Dyspnea ? Anginal Equivalent. Requesting Physician: Dr. Algis Liming, Internal Medicine    Primary Physician: [name], DO Primary Cardiologist: [name]    Pt. Profile:  83 y/o male with h/o CAD s/p rotational atherectomy/DES to prox RCA 02/09/15, with residual LAD disease treated medically, (up to 70% in multiple spots), mild AS, HTN, HLD, DM and prior h/oTIA, who was admitted for generalized weakness, altered mental status and shortness of breath. Head CT negative for stroke. W/u negative for PE and BNP normal at 19. CXR with atelectatics but no other significant findings. Cardiology consulted for evaluation of dyspnea/ ? Anginal equivalent, at the request of Dr. Algis Liming, Internal medicine.   Problem List  Past Medical History:  Diagnosis Date  . Aortic stenosis, mild    a. By echo 12/2013.  [name] Arthritis    "mostly in my hands" (02/09/2015)  . CAD (coronary artery disease)    a. 12/2014 cath showed 95% prox RCA and diffuse calcified prox to distal LAD disease up to 70% at multiple spots. S/p rotational atherectomy/DES to prox RCA 02/09/15.  . Dilated aortic root ([place])   . GERD (gastroesophageal reflux disease)   . HTN (hypertension)   . Hyperlipidemia   . Prostatitis   . Shingles   . TIA (transient ischemic attack) early 1990's; 09/2006   [name] 10/26/2010  . Type II diabetes mellitus ([place])     Past Surgical History:  Procedure Laterality Date  . CARDIAC CATHETERIZATION N/A 12/11/2014   Procedure: Left Heart Cath and Coronary Angiography;  Surgeon: Belva Crome, MD;  Location: [facility];  Service: Cardiovascular;  Laterality: N/A;  . CARDIAC CATHETERIZATION N/A 02/09/2015   Procedure: Coronary Stent Intervention-Rotoblator, temp pacer;  Surgeon: Belva Crome, MD;  Location: [facility];  Service: Cardiovascular;  Laterality: N/A;  . CATARACT  EXTRACTION W/ INTRAOCULAR LENS  IMPLANT, BILATERAL Bilateral   . EXCISIONAL HEMORRHOIDECTOMY  1957  . EYE SURGERY    . MEMBRANE PEEL Left 01/11/2015   eye     Allergies  No Known Allergies  HPI  83 y/o male with h/o CAD s/p rotational atherectomy/DES to prox RCA 02/09/15, with residual LAD disease treated medically, (up to 70% in multiple spots), mild AS, HTN, HLD, DM and prior h/oTIA, who was admitted for generalized weakness, altered mental status and shortness of breath. Head CT negative for stroke. W/u negative for PE and BNP normal at 19. CXR with atelectatics but no other significant findings. Cardiology consulted for evaluation of dyspnea/ ? Anginal equivalent, at the request of Dr. Algis Liming, Internal medicine.   The patient has been followed by [name]. Last seen in clinic 11/13/16 and was stable from a cardiac standpoint w/o any symptoms. His cardiac history is notable for CAD with heavily calcified RCA with proximal eccentric 95% stenosis, diffuse calcified proximal to distal LAD disease up to 70% at multiple spots,s/p PCI of the RCA and rotational atherectomy/DES to prox RCA 02/09/15. His LAD disease is being treated medically. He also has a h/o HTN, HLD, DM, TIA and mild AS by echo 12/2013. However f/u 2D echo done in 06/2015 reported mildly calcified aortic valve annulus, tri leaflet, normal thickness, normal cusp separation. No AS or AI was noted. Mean gradient was 9 mmHg. LVEF was also noted to be normal at 60-65% with Grade 1DD and normal wall motion. There is also  mention of a h/o of a dilated aortic root, however aortic root was reported to be normal in last study. His Lipids have been well controlled. His last lipid panel 03/2016 showed LDL to be at goal at 65 mg/dL.   His EKG this admit shows NSR witih RBBB. No ischemic abnormalities. On telemetry he had a brief period of ventricular trigeminy but now NSR. He notes recent development of exertional dyspnea and chest discomfort. He is  unable to walk up a flight of stairs w/o symptoms. He denies resting dyspnea and CP. No palpitations.  Angiographic details from his last LHF 01/2015 outlined below:   Coronary Findings   Dominance: Co-dominant  Left Main  LM lesion, 40% stenosed. calcified .  Left Anterior Descending  Ost LAD to Dist LAD lesion, 50% stenosed. The lesion is type C calcified eccentric .  Dist LAD lesion, 70% stenosed. The lesion is type C calcified eccentric .  Third Diagonal Branch  The vessel is small in size.  Left Circumflex  Prox Cx to Dist Cx lesion, 20% stenosed. calcified .  Right Coronary Artery  Prox RCA lesion, 85% stenosed. The lesion is type C calcified eccentric . The lesion was not previously treated.  Angioplasty: A drug-eluting stent was placed. A post-stent angioplasty was not performed. Maximum pressure: 14 atm. The pre-interventional distal flow is normal (TIMI 3). The post-interventional distal flow is normal (TIMI 3). The intervention was successful. No complications occured at this lesion. IC nitroglycerin was given. Rotational atherectomy was performed. 3 runs using a 1.25 mm burr. We then upgraded to a 1.5 mm burr and did another 2 runs. Mild hypotension was noted during rotational atherectomy. During each run the temperature pacemaker was required for heart rate slowing. After successful rotational atherectomy we then use cutting balloon followed by stenting using DES.  Atherectomy: Rotational atherectomy was performed. The intervention was successful.  PCI: No pre-stent angioplasty was performed. A drug-eluting stent was placed. The strut is apposed. Post-stent angioplasty was performed.  There is no residual stenosis post intervention.  Prox RCA to Dist RCA lesion, 40% stenosed. calcified eccentric .     Home Medications  Prior to Admission medications   Medication Sig Start Date End Date Taking? Authorizing Provider  ACCU-CHEK AVIVA PLUS test strip USE TO CHECK BLOOD SUGAR 3  TIMES DAILY 02/21/16  Yes [provider]  aspirin 81 MG tablet Take 81 mg by mouth every morning.    Yes [provider]  atorvastatin (LIPITOR) 20 MG tablet Take 20 mg by mouth every morning.    Yes [provider]  clopidogrel (PLAVIX) 75 MG tablet Take 1 tablet (75 mg total) by mouth daily. 01/04/16  Yes Turner, Eber Hong, MD  insulin lispro (HUMALOG) 100 UNIT/ML cartridge Inject 10-15 Units into the skin 2 (two) times daily. Pt ranges his insulin with his blood sugar readings   Yes [provider]  insulin NPH Human (HUMULIN N,NOVOLIN N) 100 UNIT/ML injection Inject 25 Units into the skin 2 (two) times daily before a meal.    Yes [provider]  isosorbide mononitrate (IMDUR) 30 MG 24 hr tablet TAKE 0.5 TABLETS (15 MG TOTAL) BY MOUTH DAILY. 08/07/16  Yes Turner, Eber Hong, MD  Melatonin 3 MG TABS Take 3 mg by mouth at bedtime as needed.   Yes [provider]  metoprolol succinate (TOPROL-XL) 25 MG 24 hr tablet TAKE 1 TABLET (25 MG TOTAL) BY MOUTH DAILY. TAKE WITH OR IMMEDIATELY FOLLOWING A MEAL. 10/05/16  Yes Turner, Eber Hong, MD  Multiple Vitamins-Minerals (MULTIVITAMIN WITH MINERALS) tablet Take 1 tablet by mouth daily.   Yes [provider]  pantoprazole (PROTONIX) 20 MG tablet TAKE 1 TABLET BY MOUTH EVERY DAY 07/07/16  Yes Turner, Traci R, MD  ramipril (ALTACE) 2.5 MG capsule Take 2.5 mg by mouth daily. 10/22/14  Yes [provider]  nitroGLYCERIN (NITROSTAT) 0.4 MG SL tablet Place 1 tablet (0.4 mg total) under the tongue every 5 (five) minutes as needed for chest pain (up to 3 doses). 02/10/15   Charlie Pitter, [facility] Medications  . aspirin  81 mg Oral q morning - 10a  . atorvastatin  20 mg Oral QHS  . clopidogrel  75 mg Oral Daily  . enoxaparin (LOVENOX) injection  40 mg Subcutaneous Q24H  . insulin aspart  0-15 Units Subcutaneous TID WC  . insulin aspart  0-5 Units Subcutaneous QHS  . insulin NPH Human  18 Units  Subcutaneous BID AC  . isosorbide mononitrate  15 mg Oral Daily  . metoprolol succinate  25 mg Oral Daily  . multivitamin with minerals  1 tablet Oral Daily  . pantoprazole  20 mg Oral Daily  . pneumococcal 23 valent vaccine  0.5 mL Intramuscular Tomorrow-1000   . sodium chloride    . cefTRIAXone (ROCEPHIN)  IV 1 g (01/03/17 0106)   acetaminophen, albuterol, dextromethorphan-guaiFENesin, hydrALAZINE, Melatonin, nitroGLYCERIN, ondansetron  Family History  Family History  Problem Relation Age of Onset  . Diabetes Mother   . Heart attack Father   . Diabetes Father   . CAD Father   . Diabetes Sister   . COPD Brother   . Diabetes Sister     Social History  Social History   Social History  . Marital status: Widowed    Spouse name: N/A  . Number of children: N/A  . Years of education: N/A   Occupational History  . retired    Social History Main Topics  . Smoking status: Former Smoker    Types: Cigarettes  . Smokeless tobacco: Never Used     Comment: "haven't smoked since I was 15" tried it once  . Alcohol use 0.0 oz/week     Comment: 02/09/2015 "might have a sip of wne a dozen times/yr"  . Drug use: No  . Sexual activity: Not Currently   Other Topics Concern  . Not on file   Social History Narrative  . No narrative on file     Review of Systems General:  No chills, fever, night sweats or weight changes.  Cardiovascular: + chest pain, + dyspnea on exertion, - edema, orthopnea, palpitations, paroxysmal nocturnal dyspnea. Dermatological: No rash, lesions/masses Respiratory: No cough, dyspnea Urologic: No hematuria, dysuria Abdominal:   No nausea, vomiting, diarrhea, bright red blood per rectum, melena, or hematemesis Neurologic:  No visual changes, wkns, changes in mental status. All other systems reviewed and are otherwise negative except as noted above.  Physical Exam  Blood pressure (!) 129/48, pulse 63, temperature (!) 97.5 F (36.4 C), temperature source  Oral, resp. rate 18, height _0  (1.626 m), weight 174 lb 8 oz (79.2 kg), SpO2 98 %.  General: Pleasant, NAD Psych: Normal affect. Neuro: Alert and oriented X 3. Moves all extremities spontaneously. HEENT: Normal  Neck: Supple without bruits or JVD. Lungs:  Resp regular and unlabored, CTA. Heart: RRR no s3, s4, or murmurs. Abdomen: Soft, non-tender, non-distended, BS + x 4.  Extremities: No clubbing, cyanosis or edema.  DP/PT/Radials 2+ and equal bilaterally.  Labs  Troponin (Point of Care Test) No results for input(s): TROPIPOC in the last 72 hours.  Recent Labs  01/02/17 1932  TROPONINI <0.03   Lab Results  Component Value Date   WBC 2.9 (L) 01/03/2017   HGB 11.8 (L) 01/03/2017   HCT 34.4 (L) 01/03/2017   MCV 85.4 01/03/2017   PLT 109 (L) 01/03/2017    Recent Labs Lab 01/02/17 1932  NA 128*  K 4.3  CL 97*  CO2 22  BUN 21*  CREATININE 1.14  CALCIUM 8.5*  PROT 5.8*  BILITOT 1.3*  ALKPHOS 101  ALT 31  AST 29  GLUCOSE 251*   Lab Results  Component Value Date   CHOL 137 03/17/2016   HDL 58 03/17/2016   LDLCALC 65 03/17/2016   TRIG 68 03/17/2016   Lab Results  Component Value Date   DDIMER 2.11 (H) 01/02/2017     Radiology/Studies  Dg Chest 2 View  Result Date: 01/02/2017 CLINICAL DATA:  83 year old male with history of generalize weakness and hyperglycemia. EXAM: CHEST  2 VIEW COMPARISON:  Chest x-ray 10/12/2013. FINDINGS: Lung volumes are normal. No consolidative airspace disease. No pleural effusions. No pneumothorax. No pulmonary nodule or mass noted. Pulmonary vasculature and the cardiomediastinal silhouette are within normal limits. Aortic atherosclerosis. IMPRESSION: 1. No radiographic evidence of acute cardiopulmonary disease. 2. Aortic atherosclerosis. Electronically Signed   By: Vinnie Langton M.D.   On: 01/02/2017 20:08   Ct Head Wo Contrast  Result Date: 01/02/2017 CLINICAL DATA:  Confusion.  Generalized weakness. EXAM: CT HEAD WITHOUT  CONTRAST TECHNIQUE: Contiguous axial images were obtained from the base of the skull through the vertex without intravenous contrast. COMPARISON:  None. FINDINGS: Brain: No evidence of acute infarction, hemorrhage, hydrocephalus, extra-axial collection or mass lesion/mass effect. Generalized atrophy, normal for age. Mild chronic small vessel ischemia for age. Vascular: Atherosclerosis of skullbase vasculature without hyperdense vessel or abnormal calcification. Skull: No skull fracture.  No focal lesion. Sinuses/Orbits: Trace mucosal thickening of ethmoid air cells. The mastoid air cells are clear. The visualized orbits are unremarkable. Other: None. IMPRESSION: Generalized atrophy. Mild chronic small vessel ischemia. No acute intracranial abnormality. Electronically Signed   By: Jeb Levering M.D.   On: 01/02/2017 20:23   Ct Angio Chest Pe W And/or Wo Contrast  Result Date: 01/02/2017 CLINICAL DATA:  83 year old with shortness of breath and chest pain. Generalized weakness. EXAM: CT ANGIOGRAPHY CHEST WITH CONTRAST TECHNIQUE: Multidetector CT imaging of the chest was performed using the standard protocol during bolus administration of intravenous contrast. Multiplanar CT image reconstructions and MIPs were obtained to evaluate the vascular anatomy. CONTRAST:  70 cc Isovue 370 IV COMPARISON:  Radiograph earlier this day.  Chest CT 08/20/2015 FINDINGS: Cardiovascular: There are no filling defects within the pulmonary arteries to suggest pulmonary embolus. Atherosclerosis and tortuosity of the thoracic aorta. No dissection or aneurysm. There are coronary artery calcifications. Heart size is normal. No pericardial effusion. Mediastinum/Nodes: Shotty right hilar nodes, largest measuring 9 mm. No additional mediastinal adenopathy. The esophagus is decompressed, small hiatal hernia. Visualized thyroid gland is normal. Lungs/Pleura: Minimal emphysema. Biapical pleuroparenchymal scarring, right greater than left. Mild  atelectasis at the left lung base. Tiny right middle lobe nodule image 99 series 6 is unchanged dating back to 2015 and considered benign. No confluent consolidation. No pulmonary edema. No pleural fluid. Upper Abdomen: Prominent spleen spanning 13 cm. Small hiatal hernia. Musculoskeletal: There are no acute or suspicious osseous abnormalities. Degenerative change  in the spine. Review of the MIP images confirms the above findings. IMPRESSION: 1. No pulmonary embolus. 2. Mild emphysema.  Shotty right hilar nodes, likely reactive. 3. Coronary artery calcifications and aortic atherosclerosis. 4. Incidental mild splenomegaly in the included upper abdomen. This is new from 57. Aortic Atherosclerosis (ICD10-I70.0) and Emphysema (ICD10-J43.9). Electronically Signed   By: Jeb Levering M.D.   On: 01/02/2017 22:36   Dg Chest Port 1 View  Result Date: 01/03/2017 CLINICAL DATA:  Shortness of breath tonight. EXAM: PORTABLE CHEST 1 VIEW COMPARISON:  CT 3 hours prior. FINDINGS: Lower lung volumes from prior exam. Unchanged heart size and mediastinal contours with atherosclerosis of the aortic arch. Increasing left basilar atelectasis. Bronchovascular crowding related to low lung volumes. No pleural effusion or pneumothorax. IMPRESSION: Increasing left basilar atelectasis. Electronically Signed   By: Jeb Levering M.D.   On: 01/03/2017 01:16    ECG  NSR with RBBB -- personally reviewed  Telemetry  Currently NSR but period of ventricular trigeminy noted -- personally reviewed   Echocardiogram Pending   ASSESSMENT AND PLAN  Principal Problem:   Bronchitis Active Problems:   Diabetes mellitus without complication (HCC)   TIA (transient ischemic attack)   HTN (hypertension)   Hyperlipidemia   Aortic stenosis, mild   CAD (coronary artery disease), native coronary artery   SOB (shortness of breath)   Sepsis ([place])   Acute metabolic encephalopathy   Hyponatremia   1. Exertional Dyspnea and CP: Pt  has known CAD and underwent rotational atherectomy/DES to prox RCA 02/09/15, with residual LAD disease treated medically, (up to 70% in multiple spots). He also has h/o AS, however this was mild by echo in June. Repeat 2D echo pending. W/u negative for CHF. BNP normal at 19 and CXR w/o edema. No infiltrates or effusions. Troponin is negative x 1. ? Ischemia from worsening coronary disease. He had 70% LAD disease in 2016, treated medically. ? Disease progression. I think he would benefit from repeat LHC with FFR testing of LAD and PCI if significant. Also assess patency of RCA. ? If symptomatic PVCs are responsible for his symptoms. Telemetry shows period of ventricular trigeminy. If negative ischemic w/u, we can consider outpatient 24-48 hr monitor to assess PVC burden. Continue current therapy for CAD>> ASA, statin, BB and LA nitrate. Internal medicine also treating for possible bronchitis.    Signed, Lyda Jester, PA-C, M[facility] 01/03/2017, 4:13 PM [facility] Pager: [phone]

## 2017-01-03 NOTE — Progress Notes (Signed)
Paged MD to update about slight behavior changes, vital signs and in no distress asleep.

## 2017-01-03 NOTE — Progress Notes (Addendum)
PROGRESS NOTE   Seth Abbott  DTO:[id number]    DOB: 25-Dec-1927    DOA: 01/02/2017  PCP: [name], DO   I have briefly reviewed patients previous medical records in [facility].  Brief Narrative:  83 year old male, PMH of HTN, HLD, TIA, GERD, CAD status post stent, No significant AS by echo June 2018, presented with complaints of progressively worsening DOE of several weeks duration, chronic intermittent chest pain and transient confusion. Patient and son provided history. Ambulates with help of walker. No strokelike symptoms. Mild cough with intermittent minimal white sputum without fever or chills. In ED noted fever of 100.77F. Positive d-dimer, normal lactate, negative troponin and BNP, negative urine analysis, negative chest x-ray, negative CT head for acute findings, CTA chest negative for PE but showed mild emphysema and incidental mild splenomegaly. Admitted for further evaluation and management.   Assessment & Plan:   Principal Problem:   Bronchitis Active Problems:   Diabetes mellitus without complication (HCC)   TIA (transient ischemic attack)   HTN (hypertension)   Hyperlipidemia   Aortic stenosis, mild   CAD (coronary artery disease), native coronary artery   SOB (shortness of breath)   Sepsis ([place])   Acute metabolic encephalopathy   Hyponatremia   1. Worsening dyspnea:? Angina equivalent. Clinically does not feel like acute bronchitis. Not volume overloaded. No clinical bronchospasm. RSV panel negative. Chest x-ray without acute findings. CTA chest: No PE, mild emphysema. Do not feel that he was septic on admission. Pro-calcitonin 0.22. Check 2-D echo. Consider cardiology consultation. For now continue IV Rocephin but low threshold to discontinue all antibiotics. Not hypoxic on room air. Repeat chest x-ray shows left basilar atelectasis. Incentive spirometry. 2. Type II DM: Placed on reduced dose of NPH home insulin. SSI added. Uncontrolled with CBGs in the  200s. May need further adjustment. 3. Essential hypertension: Mildly uncontrolled. Continue Imdur and metoprolol. Holding ramipril due to hyponatremia. 4. Hyponatremia: Possibly due to decreased oral intake and related dehydration and ramipril. Holding ramipril. Brief IV normal saline. Follow BMP. 5. CAD status post stent: Has chronic intermittent chest pain and new progressively worsening dyspnea on exertion of a couple weeks' duration. Management as above. Continue aspirin, statins, Plavix, Imdur and metoprolol. Cardiology consultation. 6. History of TIA: Continue statins, aspirin and Plavix. 7. Hyperlipidemia: Statins. 8. Transient confusion: CT head without acute abnormalities. Unclear etiology but resolved. 9. Mild splenomegaly: Incidentally noted on CT chest. No GI symptoms. Outpatient follow-up as deemed necessary. 10. Pancytopenia: Unclear etiology.? Acute viral illness versus other etiology. Follow CBC in a.m.   DVT prophylaxis: Subcutaneous Lovenox Code Status: DO NOT RESUSCITATE Family Communication: Discussed in detail with patient's son at bedside. Disposition: DC home when medically improved.   Consultants:  Cardiology-pending   Procedures:  None  Antimicrobials:  IV ceftriaxone    Subjective: Seen this morning. Reports progressively worsening dyspnea on exertion for last couple of weeks. Chronic intermittent chest pain for several years. Mild cough with intermittent white sputum. Patient's son who recently returned from [place] had some URI symptoms.   ROS: As per son at bedside, mental status changes resolved and currently mental status is back to baseline.  Objective:  Vitals:   01/03/17 0204 01/03/17 0511 01/03/17 0922 01/03/17 1200  BP: (!) 143/58 (!) 145/56 (!) 162/70 (!) 129/48  Pulse: 81 80 88 63  Resp: 12 14 18 18   Temp: 98.4 F (36.9 C) 98.4 F (36.9 C) 97.9 F (36.6 C) (!) 97.5 F (36.4 C)  TempSrc:  Oral Oral Oral Oral  SpO2: 99% 98% 100% 98%    Weight: 79.2 kg (174 lb 8 oz)     Height: 5\' 4"  (1.626 m)       Examination:  General exam: Pleasant elderly male lying comfortably propped up in bed. Respiratory system: Clear to auscultation. Respiratory effort normal. Cardiovascular system: S1 & S2 heard, RRR. No JVD, murmurs, rubs, gallops or clicks. No pedal edema. Telemetry: Sinus rhythm with BBB morphology and occasional PVCs. Gastrointestinal system: Abdomen is nondistended, soft and nontender. No organomegaly or masses felt. Normal bowel sounds heard. Central nervous system: Alert and oriented 3. No focal neurological deficits. Extremities: Symmetric 5 x 5 power. Skin: No rashes, lesions or ulcers Psychiatry: Judgement and insight appear normal. Mood & affect flat.     Data Reviewed: I have personally reviewed following labs and imaging studies  CBC:  Recent Labs Lab 01/02/17 1932 01/03/17 0554  WBC 4.8 2.9*  NEUTROABS 1.5*  --   HGB 12.1* 11.8*  HCT 34.8* 34.4*  MCV 83.9 85.4  PLT 122* 789*   Basic Metabolic Panel:  Recent Labs Lab 01/02/17 1932  NA 128*  K 4.3  CL 97*  CO2 22  GLUCOSE 251*  BUN 21*  CREATININE 1.14  CALCIUM 8.5*   Liver Function Tests:  Recent Labs Lab 01/02/17 1932  AST 29  ALT 31  ALKPHOS 101  BILITOT 1.3*  PROT 5.8*  ALBUMIN 3.0*   Cardiac Enzymes:  Recent Labs Lab 01/02/17 1932  TROPONINI <0.03   CBG:  Recent Labs Lab 01/02/17 1919 01/03/17 0248 01/03/17 0754 01/03/17 1149  GLUCAP 252* 184* 226* 257*    Recent Results (from the past 240 hour(s))  Respiratory Panel by PCR     Status: None   Collection Time: 01/03/17  2:27 AM  Result Value Ref Range Status   Adenovirus NOT DETECTED NOT DETECTED Final   Coronavirus 229E NOT DETECTED NOT DETECTED Final   Coronavirus HKU1 NOT DETECTED NOT DETECTED Final   Coronavirus NL63 NOT DETECTED NOT DETECTED Final   Coronavirus OC43 NOT DETECTED NOT DETECTED Final   Metapneumovirus NOT DETECTED NOT DETECTED Final    Rhinovirus / Enterovirus NOT DETECTED NOT DETECTED Final   Influenza A NOT DETECTED NOT DETECTED Final   Influenza B NOT DETECTED NOT DETECTED Final   Parainfluenza Virus 1 NOT DETECTED NOT DETECTED Final   Parainfluenza Virus 2 NOT DETECTED NOT DETECTED Final   Parainfluenza Virus 3 NOT DETECTED NOT DETECTED Final   Parainfluenza Virus 4 NOT DETECTED NOT DETECTED Final   Respiratory Syncytial Virus NOT DETECTED NOT DETECTED Final   Bordetella pertussis NOT DETECTED NOT DETECTED Final   Chlamydophila pneumoniae NOT DETECTED NOT DETECTED Final   Mycoplasma pneumoniae NOT DETECTED NOT DETECTED Final         Radiology Studies: Dg Chest 2 View  Result Date: 01/02/2017 CLINICAL DATA:  84 year old male with history of generalize weakness and hyperglycemia. EXAM: CHEST  2 VIEW COMPARISON:  Chest x-ray 10/12/2013. FINDINGS: Lung volumes are normal. No consolidative airspace disease. No pleural effusions. No pneumothorax. No pulmonary nodule or mass noted. Pulmonary vasculature and the cardiomediastinal silhouette are within normal limits. Aortic atherosclerosis. IMPRESSION: 1. No radiographic evidence of acute cardiopulmonary disease. 2. Aortic atherosclerosis. Electronically Signed   By: Vinnie Langton M.D.   On: 01/02/2017 20:08   Ct Head Wo Contrast  Result Date: 01/02/2017 CLINICAL DATA:  Confusion.  Generalized weakness. EXAM: CT HEAD WITHOUT CONTRAST TECHNIQUE: Contiguous axial images were obtained  from the base of the skull through the vertex without intravenous contrast. COMPARISON:  None. FINDINGS: Brain: No evidence of acute infarction, hemorrhage, hydrocephalus, extra-axial collection or mass lesion/mass effect. Generalized atrophy, normal for age. Mild chronic small vessel ischemia for age. Vascular: Atherosclerosis of skullbase vasculature without hyperdense vessel or abnormal calcification. Skull: No skull fracture.  No focal lesion. Sinuses/Orbits: Trace mucosal thickening of  ethmoid air cells. The mastoid air cells are clear. The visualized orbits are unremarkable. Other: None. IMPRESSION: Generalized atrophy. Mild chronic small vessel ischemia. No acute intracranial abnormality. Electronically Signed   By: Jeb Levering M.D.   On: 01/02/2017 20:23   Ct Angio Chest Pe W And/or Wo Contrast  Result Date: 01/02/2017 CLINICAL DATA:  83 year old with shortness of breath and chest pain. Generalized weakness. EXAM: CT ANGIOGRAPHY CHEST WITH CONTRAST TECHNIQUE: Multidetector CT imaging of the chest was performed using the standard protocol during bolus administration of intravenous contrast. Multiplanar CT image reconstructions and MIPs were obtained to evaluate the vascular anatomy. CONTRAST:  70 cc Isovue 370 IV COMPARISON:  Radiograph earlier this day.  Chest CT 08/20/2015 FINDINGS: Cardiovascular: There are no filling defects within the pulmonary arteries to suggest pulmonary embolus. Atherosclerosis and tortuosity of the thoracic aorta. No dissection or aneurysm. There are coronary artery calcifications. Heart size is normal. No pericardial effusion. Mediastinum/Nodes: Shotty right hilar nodes, largest measuring 9 mm. No additional mediastinal adenopathy. The esophagus is decompressed, small hiatal hernia. Visualized thyroid gland is normal. Lungs/Pleura: Minimal emphysema. Biapical pleuroparenchymal scarring, right greater than left. Mild atelectasis at the left lung base. Tiny right middle lobe nodule image 99 series 6 is unchanged dating back to 2015 and considered benign. No confluent consolidation. No pulmonary edema. No pleural fluid. Upper Abdomen: Prominent spleen spanning 13 cm. Small hiatal hernia. Musculoskeletal: There are no acute or suspicious osseous abnormalities. Degenerative change in the spine. Review of the MIP images confirms the above findings. IMPRESSION: 1. No pulmonary embolus. 2. Mild emphysema.  Shotty right hilar nodes, likely reactive. 3. Coronary artery  calcifications and aortic atherosclerosis. 4. Incidental mild splenomegaly in the included upper abdomen. This is new from 47. Aortic Atherosclerosis (ICD10-I70.0) and Emphysema (ICD10-J43.9). Electronically Signed   By: Jeb Levering M.D.   On: 01/02/2017 22:36   Dg Chest Port 1 View  Result Date: 01/03/2017 CLINICAL DATA:  Shortness of breath tonight. EXAM: PORTABLE CHEST 1 VIEW COMPARISON:  CT 3 hours prior. FINDINGS: Lower lung volumes from prior exam. Unchanged heart size and mediastinal contours with atherosclerosis of the aortic arch. Increasing left basilar atelectasis. Bronchovascular crowding related to low lung volumes. No pleural effusion or pneumothorax. IMPRESSION: Increasing left basilar atelectasis. Electronically Signed   By: Jeb Levering M.D.   On: 01/03/2017 01:16        Scheduled Meds: . aspirin  81 mg Oral q morning - 10a  . atorvastatin  20 mg Oral QHS  . clopidogrel  75 mg Oral Daily  . enoxaparin (LOVENOX) injection  40 mg Subcutaneous Q24H  . insulin aspart  0-9 Units Subcutaneous TID WC  . insulin NPH Human  18 Units Subcutaneous BID AC  . isosorbide mononitrate  15 mg Oral Daily  . metoprolol succinate  25 mg Oral Daily  . multivitamin with minerals  1 tablet Oral Daily  . pantoprazole  20 mg Oral Daily  . pneumococcal 23 valent vaccine  0.5 mL Intramuscular Tomorrow-1000   Continuous Infusions: . sodium chloride 100 mL/hr at 01/03/17 0313  . cefTRIAXone (ROCEPHIN)  IV 1 g (01/03/17 0106)     LOS: 0 days     HONGALGI,ANAND, MD, FACP, FHM. [facility] Pager [phone]  If 7PM-7AM, please contact night-coverage [email or web address] Password [facility] 01/03/2017, 3:46 PM

## 2017-01-03 NOTE — Progress Notes (Signed)
Pt is increasing confused, Poor awareness was found asking for help with arms extended across the beds was given a bath and patient wen back to sleep.

## 2017-01-03 NOTE — ED Notes (Signed)
RN called CT r/e CTA results - CT stated that scan has been read, but is not showing up in EPIC. MD called radiologist, who faxed over scan results - results show no pulmonary embolism.

## 2017-01-03 NOTE — Progress Notes (Signed)
Pt is alert and confused 2-3 from home alone with sons support,  with a delayed response and Stutter. Vital stable, CT Chest Neg. pending cultures, and Respiratory  Panel.

## 2017-01-04 ENCOUNTER — Encounter (HOSPITAL_COMMUNITY)
Admission: EM | Disposition: A | Discharge status: Home / Self Care | Payer: Self-pay | Source: Home / Self Care | Attending: Internal Medicine

## 2017-01-04 DIAGNOSIS — I25119 Atherosclerotic heart disease of native coronary artery with unspecified angina pectoris: Secondary | ICD-10-CM

## 2017-01-04 DIAGNOSIS — E222 Syndrome of inappropriate secretion of antidiuretic hormone: Secondary | ICD-10-CM

## 2017-01-04 DIAGNOSIS — R0609 Other forms of dyspnea: Secondary | ICD-10-CM

## 2017-01-04 DIAGNOSIS — E871 Hypo-osmolality and hyponatremia: Secondary | ICD-10-CM

## 2017-01-04 HISTORY — PX: LEFT HEART CATH AND CORONARY ANGIOGRAPHY: CATH118249

## 2017-01-04 LAB — BASIC METABOLIC PANEL
ANION GAP: 6 (ref 5–15)
BUN: 13 mg/dL (ref 6–20)
CO2: 22 mmol/L (ref 22–32)
CREATININE: 1 mg/dL (ref 0.61–1.24)
Calcium: 7.9 mg/dL — ABNORMAL LOW (ref 8.9–10.3)
Chloride: 100 mmol/L — ABNORMAL LOW (ref 101–111)
GFR calc non Af Amer: >60 mL/min (ref >60)
Glucose, Bld: 140 mg/dL — ABNORMAL HIGH (ref 65–99)
POTASSIUM: 4.2 mmol/L (ref 3.5–5.1)
Sodium: 128 mmol/L — ABNORMAL LOW (ref 135–145)

## 2017-01-04 LAB — CBC
HEMATOCRIT: 35.2 % — AB (ref 39.0–52.0)
Hemoglobin: 11.8 g/dL — ABNORMAL LOW (ref 13.0–17.0)
MCH: 28.6 pg (ref 26.0–34.0)
MCHC: 33.5 g/dL (ref 30.0–36.0)
MCV: 85.2 fL (ref 78.0–100.0)
PLATELETS: 124 10*3/uL — AB (ref 150–400)
RBC: 4.13 MIL/uL — AB (ref 4.22–5.81)
RDW: 15.9 % — ABNORMAL HIGH (ref 11.5–15.5)
WBC: 4.2 10*3/uL (ref 4.0–10.5)

## 2017-01-04 LAB — GLUCOSE, CAPILLARY
GLUCOSE-CAPILLARY: 170 mg/dL — AB (ref 65–99)
GLUCOSE-CAPILLARY: 151 mg/dL — AB (ref 65–99)
Glucose-Capillary: 119 mg/dL — ABNORMAL HIGH (ref 65–99)
Glucose-Capillary: 128 mg/dL — ABNORMAL HIGH (ref 65–99)
Glucose-Capillary: 158 mg/dL — ABNORMAL HIGH (ref 65–99)
Glucose-Capillary: 214 mg/dL — ABNORMAL HIGH (ref 65–99)
Glucose-Capillary: 296 mg/dL — ABNORMAL HIGH (ref 65–99)

## 2017-01-04 LAB — PROTIME-INR
INR: 1.2
Prothrombin Time: 15.3 seconds — ABNORMAL HIGH (ref 11.4–15.2)

## 2017-01-04 LAB — OSMOLALITY: Osmolality: 275 mOsm/kg (ref 275–295)

## 2017-01-04 SURGERY — LEFT HEART CATH AND CORONARY ANGIOGRAPHY
Anesthesia: LOCAL

## 2017-01-04 MED ORDER — SODIUM CHLORIDE 0.9 % IV SOLN: 250.0000 mL | INTRAVENOUS | Status: DC | PRN

## 2017-01-04 MED ORDER — ISOSORBIDE MONONITRATE ER 30 MG PO TB24
30.0000 mg | ORAL_TABLET | Freq: Every day | ORAL | Status: DC
Start: 2017-01-05 — End: 2017-01-05
  Administered 2017-01-05: 30 mg via ORAL
  Filled 2017-01-04: qty 1

## 2017-01-04 MED ORDER — SODIUM CHLORIDE 0.9% FLUSH: 3.0000 mL | INTRAVENOUS | Status: DC | PRN

## 2017-01-04 MED ORDER — LIDOCAINE HCL (PF) 1 % IJ SOLN
INTRAMUSCULAR | Status: AC
  Filled 2017-01-04: qty 30

## 2017-01-04 MED ORDER — ACETAMINOPHEN 325 MG PO TABS: 650.0000 mg | ORAL_TABLET | ORAL | Status: DC | PRN

## 2017-01-04 MED ORDER — FENTANYL CITRATE (PF) 100 MCG/2ML IJ SOLN
INTRAMUSCULAR | Status: AC
  Filled 2017-01-04: qty 2

## 2017-01-04 MED ORDER — MIDAZOLAM HCL 2 MG/2ML IJ SOLN
INTRAMUSCULAR | Status: DC | PRN
Start: 2017-01-04 — End: 2017-01-04
  Administered 2017-01-04: 0.5 mg via INTRAVENOUS

## 2017-01-04 MED ORDER — HEPARIN (PORCINE) IN NACL 2-0.9 UNIT/ML-% IJ SOLN
INTRAMUSCULAR | Status: AC
  Filled 2017-01-04: qty 1000

## 2017-01-04 MED ORDER — IOPAMIDOL (ISOVUE-370) INJECTION 76%
INTRAVENOUS | Status: DC | PRN
Start: 2017-01-04 — End: 2017-01-04
  Administered 2017-01-04: 90 mL via INTRA_ARTERIAL

## 2017-01-04 MED ORDER — ENOXAPARIN SODIUM 40 MG/0.4ML ~~LOC~~ SOLN
40.0000 mg | SUBCUTANEOUS | Status: DC
  Administered 2017-01-05: 40 mg via SUBCUTANEOUS
  Filled 2017-01-04: qty 0.4

## 2017-01-04 MED ORDER — VERAPAMIL HCL 2.5 MG/ML IV SOLN
INTRAVENOUS | Status: AC
  Filled 2017-01-04: qty 2

## 2017-01-04 MED ORDER — FENTANYL CITRATE (PF) 100 MCG/2ML IJ SOLN
INTRAMUSCULAR | Status: DC | PRN
  Administered 2017-01-04: 25 ug via INTRAVENOUS

## 2017-01-04 MED ORDER — SODIUM CHLORIDE 0.9 % IV SOLN
INTRAVENOUS | Status: AC
  Administered 2017-01-04: 18:00:00 via INTRAVENOUS

## 2017-01-04 MED ORDER — MIDAZOLAM HCL 2 MG/2ML IJ SOLN
INTRAMUSCULAR | Status: AC
  Filled 2017-01-04: qty 2

## 2017-01-04 MED ORDER — HEPARIN SODIUM (PORCINE) 1000 UNIT/ML IJ SOLN
INTRAMUSCULAR | Status: AC
  Filled 2017-01-04: qty 1

## 2017-01-04 MED ORDER — LIDOCAINE HCL (PF) 1 % IJ SOLN
INTRAMUSCULAR | Status: DC | PRN
Start: 2017-01-04 — End: 2017-01-04
  Administered 2017-01-04: 2 mL

## 2017-01-04 MED ORDER — HEPARIN SODIUM (PORCINE) 1000 UNIT/ML IJ SOLN
INTRAMUSCULAR | Status: DC | PRN
  Administered 2017-01-04: 4000 [IU] via INTRAVENOUS

## 2017-01-04 MED ORDER — SODIUM CHLORIDE 0.9% FLUSH
3.0000 mL | Freq: Two times a day (BID) | INTRAVENOUS | Status: DC
  Administered 2017-01-04 – 2017-01-05 (×2): 3 mL via INTRAVENOUS

## 2017-01-04 MED ORDER — ONDANSETRON HCL 4 MG/2ML IJ SOLN: 4.0000 mg | Freq: Four times a day (QID) | INTRAMUSCULAR | Status: DC | PRN

## 2017-01-04 MED ORDER — HEPARIN (PORCINE) IN NACL 2-0.9 UNIT/ML-% IJ SOLN
INTRAMUSCULAR | Status: AC | PRN
  Administered 2017-01-04: 1000 mL

## 2017-01-04 MED ORDER — VERAPAMIL HCL 2.5 MG/ML IV SOLN
INTRAVENOUS | Status: DC | PRN
  Administered 2017-01-04: 16:00:00 via INTRA_ARTERIAL

## 2017-01-04 MED ORDER — IOPAMIDOL (ISOVUE-370) INJECTION 76%
INTRAVENOUS | Status: AC
  Filled 2017-01-04: qty 50

## 2017-01-04 SURGICAL SUPPLY — 13 items
CATH EXPO 5FR FR4 (CATHETERS) ×1 IMPLANT
CATH INFINITI 5 FR JL3.5 (CATHETERS) ×1 IMPLANT
COVER PRB 48X5XTLSCP FOLD TPE (BAG) IMPLANT
COVER PROBE 5X48 (BAG) ×2
DEVICE RAD COMP TR BAND LRG (VASCULAR PRODUCTS) ×1 IMPLANT
GLIDESHEATH SLEND A-KIT 6F 22G (SHEATH) ×1 IMPLANT
GUIDEWIRE INQWIRE 1.5J.035X260 (WIRE) IMPLANT
INQWIRE 1.5J .035X260CM (WIRE) ×2
KIT HEART LEFT (KITS) ×2 IMPLANT
PACK CARDIAC CATHETERIZATION (CUSTOM PROCEDURE TRAY) ×2 IMPLANT
TRANSDUCER W/STOPCOCK (MISCELLANEOUS) ×2 IMPLANT
TUBING CIL FLEX 10 FLL-RA (TUBING) ×2 IMPLANT
WIRE HI TORQ VERSACORE-J 145CM (WIRE) ×1 IMPLANT

## 2017-01-04 NOTE — Interval H&P Note (Signed)
Cath Lab Visit (complete for each Cath Lab visit)  Clinical Evaluation Leading to the Procedure:   ACS: No.  Non-ACS:    Anginal Classification: CCS IV  Anti-ischemic medical therapy: No Therapy  Non-Invasive Test Results: No non-invasive testing performed  Prior CABG: No previous CABG      History and Physical Interval Note:  01/04/2017 3:36 PM  Seth Abbott  has presented today for surgery, with the diagnosis of CP  The various methods of treatment have been discussed with the patient and family. After consideration of risks, benefits and other options for treatment, the patient has consented to  Procedure(s): Left Heart Cath and Coronary Angiography (N/A) as a surgical intervention .  The patient's history has been reviewed, patient examined, no change in status, stable for surgery.  I have reviewed the patient's chart and labs.  Questions were answered to the patient's satisfaction.    Patient has DO NOT RESUSCITATE status. I discussed the situation with both the patient and family and we all agree that the DO NOT RESUSCITATE should be rescinded during the procedure to allow [name] to adequately care for him.   Belva Crome III

## 2017-01-04 NOTE — Progress Notes (Addendum)
PROGRESS NOTE   Seth Abbott  KDT:[id number]    DOB: [birth year]    DOA: 01/02/2017  PCP: [name], DO   I have briefly reviewed patients previous medical records in [facility].  Brief Narrative:  83 year old male, PMH of HTN, HLD, TIA, GERD, CAD status post stent, No significant AS by echo June 2018, presented with complaints of progressively worsening DOE of several weeks duration, chronic intermittent chest pain and transient confusion. Patient and son provided history. Ambulates with help of walker. No strokelike symptoms. Mild cough with intermittent minimal white sputum without fever or chills. In ED noted fever of 100.17F. Positive d-dimer, normal lactate, negative troponin and BNP, negative urine analysis, negative chest x-ray, negative CT head for acute findings, CTA chest negative for PE but showed mild emphysema and incidental mild splenomegaly. Admitted for further evaluation and management.Dyspnea and chest pain suspected due to angina. Cardiology consulted and planned for [facility] on 7/26.   Assessment & Plan:   Principal Problem:   Bronchitis Active Problems:   Diabetes mellitus without complication (HCC)   TIA (transient ischemic attack)   HTN (hypertension)   Hyperlipidemia   Aortic stenosis, mild   CAD (coronary artery disease), native coronary artery   SOB (shortness of breath)   Sepsis ([place])   Acute metabolic encephalopathy   Hyponatremia   S/P coronary artery stent placement   1. Worsening dyspnea and chronic chest pain:? Angina equivalent. Clinically does not feel like acute bronchitis. Not volume overloaded. No clinical bronchospasm. RSV panel negative. Chest x-ray without acute findings. CTA chest: No PE, mild emphysema. Do not feel that he was septic on admission. Pro-calcitonin 0.22.  2-D echo: Normal systolic function and grade 1 diastolic dysfunction. Not hypoxic on room air. Repeat chest x-ray shows left basilar atelectasis. Incentive spirometry.  Cardiology input appreciated discussed with cardiology. Symptoms concerning for ischemia. Patient and family have agreed for [facility] scheduled for 7/27. Discontinue all antibiotics. 2. Type II DM: Placed on reduced dose of NPH home insulin. SSI added. Better controlled today with CBGs in the mid 100s. May need further adjustment but will hold off while patient NPO for cardiac cath. 3. Essential hypertension: Mildly uncontrolled. Continue Imdur and metoprolol. Holding ramipril due to hyponatremia. 4. Hyponatremia/possible SIADH: Unclear etiology. Brief IV saline but sodium has plateaued at 128. Clinically appears euvolemic. Ramipril on hold. Serum osmolarity 275. Urine osmolarity 712. Fluid restriction. Follow BMP 5. CAD status post stent: Has chronic intermittent chest pain and new progressively worsening dyspnea on exertion of a couple weeks' duration. Management as above. Continue aspirin, statins, Plavix, Imdur and metoprolol. Cardiology follow-up appreciated. 6. History of TIA: Continue statins, aspirin and Plavix. 7. Hyperlipidemia: Statins. 8. Transient confusion: CT head without acute abnormalities. Appears to have confusion at night suggesting sundowning and possible dementia. 9. Mild splenomegaly: Incidentally noted on CT chest. No GI symptoms. Outpatient follow-up as deemed necessary. 10. Pancytopenia: Unclear etiology.? Acute viral illness versus other etiology. Hemoglobin stable. Leukopenia resolved. Thrombocytopenia better. 11. Mechanical fall: Unwitnessed fall overnight 7/25 when patient was found sitting on the floor. No injuries sustained or reported. Fall precautions.   DVT prophylaxis: Subcutaneous Lovenox Code Status: DO NOT RESUSCITATE Family Communication: Discussed in detail with patient's son at bedside. Disposition: DC SNF when medically improved.   Consultants:  Cardiology  Procedures:   2-D echo 01/03/17: Study Conclusions  - Left ventricle: The cavity size was normal.  There was moderate   focal basal hypertrophy. Systolic function was normal. The  estimated ejection fraction was in the range of 60% to 65%. Wall   motion was normal; there were no regional wall motion   abnormalities. There was an increased relative contribution of   atrial contraction to ventricular filling. Doppler parameters are   consistent with abnormal left ventricular relaxation (grade 1   diastolic dysfunction). Doppler parameters are consistent with   high ventricular filling pressure. - Aortic valve: Severely calcified annulus. Trileaflet; normal   thickness, mildly calcified leaflets. There was mild stenosis.   There was trivial regurgitation. Mean gradient (S): 12 mm Hg.   Valve area (VTI): 1.91 cm^2. Valve area (Vmax): 1.52 cm^2. Valve   area (Vmean): 1.52 cm^2. - Aorta: Aortic root dimension: 41 mm (ED). - Aortic root: The aortic root was mildly dilated.   Antimicrobials:  IV ceftriaxone -discontinued.   Subjective: Overnight events noted. Sustained unwitnessed mechanical fall. Seemed confused overnight. Feels better this morning. No chest pain, dyspnea or cough reported.  ROS: As per son at bedside, mental status changes resolved and currently mental status is back to baseline.  Objective:  Vitals:   01/04/17 0012 01/04/17 0610 01/04/17 0748 01/04/17 1111  BP: (!) 138/55 (!) 147/70 (!) 125/57 (!) 119/50  Pulse: 89 82 76 72  Resp: 18 20 18    Temp: 98.2 F (36.8 C) 98.1 F (36.7 C) (!) 97.5 F (36.4 C)   TempSrc: Oral Oral Oral   SpO2: 96% 98% 98% 98%  Weight:  79.6 kg (175 lb 6.4 oz)    Height:        Examination:  General exam: Pleasant elderly male lying comfortably propped up in bed. Respiratory system: Clear to auscultation. Respiratory effort normal. Cardiovascular system: S1 & S2 heard, RRR. No JVD, murmurs, rubs, gallops or clicks. No pedal edema. Telemetry: Sinus rhythm with BBB morphology . An episode of trigeminy noted on 7/25  morning. Gastrointestinal system: Abdomen is nondistended, soft and nontender. No organomegaly or masses felt. Normal bowel sounds heard. Central nervous system: Alert and oriented 3. No focal neurological deficits. Extremities: Symmetric 5 x 5 power. Skin: No rashes, lesions or ulcers. No obvious injuries noted. Psychiatry: Judgement and insight appear normal. Mood & affect flat.     Data Reviewed: I have personally reviewed following labs and imaging studies  CBC:  Recent Labs Lab 01/02/17 1932 01/03/17 0554 01/04/17 1031  WBC 4.8 2.9* 4.2  NEUTROABS 1.5*  --   --   HGB 12.1* 11.8* 11.8*  HCT 34.8* 34.4* 35.2*  MCV 83.9 85.4 85.2  PLT 122* 109* 086*   Basic Metabolic Panel:  Recent Labs Lab 01/02/17 1932 01/04/17 0422  NA 128* 128*  K 4.3 4.2  CL 97* 100*  CO2 22 22  GLUCOSE 251* 140*  BUN 21* 13  CREATININE 1.14 1.00  CALCIUM 8.5* 7.9*   Liver Function Tests:  Recent Labs Lab 01/02/17 1932  AST 29  ALT 31  ALKPHOS 101  BILITOT 1.3*  PROT 5.8*  ALBUMIN 3.0*   Cardiac Enzymes:  Recent Labs Lab 01/02/17 1932  TROPONINI <0.03   CBG:  Recent Labs Lab 01/03/17 1940 01/03/17 2210 01/04/17 0012 01/04/17 0746 01/04/17 1108  GLUCAP 258* 183* 158* 170* 151*    Recent Results (from the past 240 hour(s))  Culture, blood (routine x 2) Call MD if unable to obtain prior to antibiotics being given     Status: None (Preliminary result)   Collection Time: 01/03/17  2:10 AM  Result Value Ref Range Status   Specimen  Description BLOOD RIGHT WRIST  Final   Special Requests   Final    BOTTLES DRAWN AEROBIC AND ANAEROBIC Blood Culture adequate volume   Culture NO GROWTH 1 DAY  Final   Report Status PENDING  Incomplete  Respiratory Panel by PCR     Status: None   Collection Time: 01/03/17  2:27 AM  Result Value Ref Range Status   Adenovirus NOT DETECTED NOT DETECTED Final   Coronavirus 229E NOT DETECTED NOT DETECTED Final   Coronavirus HKU1 NOT DETECTED  NOT DETECTED Final   Coronavirus NL63 NOT DETECTED NOT DETECTED Final   Coronavirus OC43 NOT DETECTED NOT DETECTED Final   Metapneumovirus NOT DETECTED NOT DETECTED Final   Rhinovirus / Enterovirus NOT DETECTED NOT DETECTED Final   Influenza A NOT DETECTED NOT DETECTED Final   Influenza B NOT DETECTED NOT DETECTED Final   Parainfluenza Virus 1 NOT DETECTED NOT DETECTED Final   Parainfluenza Virus 2 NOT DETECTED NOT DETECTED Final   Parainfluenza Virus 3 NOT DETECTED NOT DETECTED Final   Parainfluenza Virus 4 NOT DETECTED NOT DETECTED Final   Respiratory Syncytial Virus NOT DETECTED NOT DETECTED Final   Bordetella pertussis NOT DETECTED NOT DETECTED Final   Chlamydophila pneumoniae NOT DETECTED NOT DETECTED Final   Mycoplasma pneumoniae NOT DETECTED NOT DETECTED Final  Culture, blood (routine x 2) Call MD if unable to obtain prior to antibiotics being given     Status: None (Preliminary result)   Collection Time: 01/03/17  2:27 AM  Result Value Ref Range Status   Specimen Description BLOOD LEFT WRIST  Final   Special Requests   Final    BOTTLES DRAWN AEROBIC AND ANAEROBIC Blood Culture results may not be optimal due to an inadequate volume of blood received in culture bottles   Culture NO GROWTH 1 DAY  Final   Report Status PENDING  Incomplete         Radiology Studies: Dg Chest 2 View  Result Date: 01/02/2017 CLINICAL DATA:  83 year old male with history of generalize weakness and hyperglycemia. EXAM: CHEST  2 VIEW COMPARISON:  Chest x-ray 10/12/2013. FINDINGS: Lung volumes are normal. No consolidative airspace disease. No pleural effusions. No pneumothorax. No pulmonary nodule or mass noted. Pulmonary vasculature and the cardiomediastinal silhouette are within normal limits. Aortic atherosclerosis. IMPRESSION: 1. No radiographic evidence of acute cardiopulmonary disease. 2. Aortic atherosclerosis. Electronically Signed   By: Vinnie Langton M.D.   On: 01/02/2017 20:08   Ct Head Wo  Contrast  Result Date: 01/02/2017 CLINICAL DATA:  Confusion.  Generalized weakness. EXAM: CT HEAD WITHOUT CONTRAST TECHNIQUE: Contiguous axial images were obtained from the base of the skull through the vertex without intravenous contrast. COMPARISON:  None. FINDINGS: Brain: No evidence of acute infarction, hemorrhage, hydrocephalus, extra-axial collection or mass lesion/mass effect. Generalized atrophy, normal for age. Mild chronic small vessel ischemia for age. Vascular: Atherosclerosis of skullbase vasculature without hyperdense vessel or abnormal calcification. Skull: No skull fracture.  No focal lesion. Sinuses/Orbits: Trace mucosal thickening of ethmoid air cells. The mastoid air cells are clear. The visualized orbits are unremarkable. Other: None. IMPRESSION: Generalized atrophy. Mild chronic small vessel ischemia. No acute intracranial abnormality. Electronically Signed   By: Jeb Levering M.D.   On: 01/02/2017 20:23   Ct Angio Chest Pe W And/or Wo Contrast  Result Date: 01/02/2017 CLINICAL DATA:  83 year old with shortness of breath and chest pain. Generalized weakness. EXAM: CT ANGIOGRAPHY CHEST WITH CONTRAST TECHNIQUE: Multidetector CT imaging of the chest was performed using the  standard protocol during bolus administration of intravenous contrast. Multiplanar CT image reconstructions and MIPs were obtained to evaluate the vascular anatomy. CONTRAST:  70 cc Isovue 370 IV COMPARISON:  Radiograph earlier this day.  Chest CT 08/20/2015 FINDINGS: Cardiovascular: There are no filling defects within the pulmonary arteries to suggest pulmonary embolus. Atherosclerosis and tortuosity of the thoracic aorta. No dissection or aneurysm. There are coronary artery calcifications. Heart size is normal. No pericardial effusion. Mediastinum/Nodes: Shotty right hilar nodes, largest measuring 9 mm. No additional mediastinal adenopathy. The esophagus is decompressed, small hiatal hernia. Visualized thyroid gland is  normal. Lungs/Pleura: Minimal emphysema. Biapical pleuroparenchymal scarring, right greater than left. Mild atelectasis at the left lung base. Tiny right middle lobe nodule image 99 series 6 is unchanged dating back to 2015 and considered benign. No confluent consolidation. No pulmonary edema. No pleural fluid. Upper Abdomen: Prominent spleen spanning 13 cm. Small hiatal hernia. Musculoskeletal: There are no acute or suspicious osseous abnormalities. Degenerative change in the spine. Review of the MIP images confirms the above findings. IMPRESSION: 1. No pulmonary embolus. 2. Mild emphysema.  Shotty right hilar nodes, likely reactive. 3. Coronary artery calcifications and aortic atherosclerosis. 4. Incidental mild splenomegaly in the included upper abdomen. This is new from 30. Aortic Atherosclerosis (ICD10-I70.0) and Emphysema (ICD10-J43.9). Electronically Signed   By: Jeb Levering M.D.   On: 01/02/2017 22:36   Dg Chest Port 1 View  Result Date: 01/03/2017 CLINICAL DATA:  Shortness of breath tonight. EXAM: PORTABLE CHEST 1 VIEW COMPARISON:  CT 3 hours prior. FINDINGS: Lower lung volumes from prior exam. Unchanged heart size and mediastinal contours with atherosclerosis of the aortic arch. Increasing left basilar atelectasis. Bronchovascular crowding related to low lung volumes. No pleural effusion or pneumothorax. IMPRESSION: Increasing left basilar atelectasis. Electronically Signed   By: Jeb Levering M.D.   On: 01/03/2017 01:16        Scheduled Meds: . aspirin  81 mg Oral q morning - 10a  . atorvastatin  20 mg Oral QHS  . clopidogrel  75 mg Oral Daily  . enoxaparin (LOVENOX) injection  40 mg Subcutaneous Q24H  . insulin aspart  0-15 Units Subcutaneous TID WC  . insulin aspart  0-5 Units Subcutaneous QHS  . insulin NPH Human  18 Units Subcutaneous BID AC  . isosorbide mononitrate  15 mg Oral Daily  . metoprolol succinate  25 mg Oral Daily  . multivitamin with minerals  1 tablet Oral  Daily  . pantoprazole  20 mg Oral Daily  . pneumococcal 23 valent vaccine  0.5 mL Intramuscular Tomorrow-1000   Continuous Infusions: . cefTRIAXone (ROCEPHIN)  IV 1 g (01/04/17 0114)     LOS: 1 day     Shakoya Gilmore, MD, FACP, FHM. [facility] Pager [phone]  If 7PM-7AM, please contact night-coverage [email or web address] Password [email or web address] 01/04/2017, 1:49 PM

## 2017-01-04 NOTE — Progress Notes (Signed)
Patient forgot where he was, tried to get up, bed alarm went of. When nurse got to room, heard a noise and patient was sitting on floor. Says he didn't hit his head. Patient connected to IV tubing and was caught by the IV tubing. Fall protocol followed, Patient switched to high/low bed.

## 2017-01-04 NOTE — Progress Notes (Signed)
Seth Abbott was noted to have progression of his LAD disease on cath.  Intervention is possible but higher risk than a standard PCI.  After discussion with Dr. Tamala Julian, will plan for a trial of medical management for Seth Abbott's CAD.  Start by increasing Imdur to 30 mg daily.  If he fails medical management, this could be considered at a later date.   Berneice Zettlemoyer C. Oval Linsey, MD, [facility] 01/04/2017 6:06 PM

## 2017-01-04 NOTE — Progress Notes (Signed)
Patient continues to get up without calling for assistance, will not keep condom catheter on. Patient stable, still says he feels fine.

## 2017-01-04 NOTE — Progress Notes (Signed)
Progress Note  Patient Name: Seth Abbott Date of Encounter: 01/04/2017  Primary Cardiologist: [name]  Subjective   Feeling well.  No chest pain or shortness of breath.  Hasn't been out of the bed.   Inpatient Medications    Scheduled Meds: . aspirin  81 mg Oral q morning - 10a  . atorvastatin  20 mg Oral QHS  . clopidogrel  75 mg Oral Daily  . enoxaparin (LOVENOX) injection  40 mg Subcutaneous Q24H  . insulin aspart  0-15 Units Subcutaneous TID WC  . insulin aspart  0-5 Units Subcutaneous QHS  . insulin NPH Human  18 Units Subcutaneous BID AC  . isosorbide mononitrate  15 mg Oral Daily  . metoprolol succinate  25 mg Oral Daily  . multivitamin with minerals  1 tablet Oral Daily  . pantoprazole  20 mg Oral Daily  . pneumococcal 23 valent vaccine  0.5 mL Intramuscular Tomorrow-1000   Continuous Infusions: . cefTRIAXone (ROCEPHIN)  IV 1 g (01/04/17 0114)   PRN Meds: acetaminophen, albuterol, dextromethorphan-guaiFENesin, hydrALAZINE, Melatonin, nitroGLYCERIN, ondansetron   Vital Signs    Vitals:   01/03/17 2033 01/04/17 0012 01/04/17 0610 01/04/17 0748  BP: (!) 118/52 (!) 138/55 (!) 147/70 (!) 125/57  Pulse: 91 89 82 76  Resp: 18 18 20 18   Temp: 99.6 F (37.6 C) 98.2 F (36.8 C) 98.1 F (36.7 C) (!) 97.5 F (36.4 C)  TempSrc: Oral Oral Oral Oral  SpO2: 97% 96% 98% 98%  Weight:   79.6 kg (175 lb 6.4 oz)   Height:        Intake/Output Summary (Last 24 hours) at 01/04/17 0848 Last data filed at 01/04/17 0804  Gross per 24 hour  Intake           1597.5 ml  Output              875 ml  Net            722.5 ml   Filed Weights   01/03/17 0204 01/04/17 0610  Weight: 79.2 kg (174 lb 8 oz) 79.6 kg (175 lb 6.4 oz)    Telemetry    Sinus rhythm.  Ventricular trigeminy. - Personally Reviewed  ECG    Sinus rhythm.  RBBB.  Rate 76 bpm - Personally Reviewed  Physical Exam   GEN: Well-appearing.  No acute distress.   Neck: No JVD Cardiac: RRR, no murmurs,  rubs, or gallops.  Respiratory: Clear to auscultation bilaterally. GI: Soft, nontender, non-distended  MS: No edema; No deformity. Neuro:  Nonfocal  Psych: Normal affect   Labs    Chemistry Recent Labs Lab 01/02/17 1932 01/04/17 0422  NA 128* 128*  K 4.3 4.2  CL 97* 100*  CO2 22 22  GLUCOSE 251* 140*  BUN 21* 13  CREATININE 1.14 1.00  CALCIUM 8.5* 7.9*  PROT 5.8*  --   ALBUMIN 3.0*  --   AST 29  --   ALT 31  --   ALKPHOS 101  --   BILITOT 1.3*  --   GFRNONAA 56* >60  GFRAA >60 >60  ANIONGAP 9 6     Hematology Recent Labs Lab 01/02/17 1932 01/03/17 0554  WBC 4.8 2.9*  RBC 4.15* 4.03*  HGB 12.1* 11.8*  HCT 34.8* 34.4*  MCV 83.9 85.4  MCH 29.2 29.3  MCHC 34.8 34.3  RDW 15.7* 15.7*  PLT 122* 109*    Cardiac Enzymes Recent Labs Lab 01/02/17 1932  TROPONINI <0.03  No results for input(s): TROPIPOC in the last 168 hours.   BNP Recent Labs Lab 01/02/17 1932  BNP 19.8     DDimer  Recent Labs Lab 01/02/17 1932  DDIMER 2.11*     Radiology    Dg Chest 2 View  Result Date: 01/02/2017 CLINICAL DATA:  83 year old male with history of generalize weakness and hyperglycemia. EXAM: CHEST  2 VIEW COMPARISON:  Chest x-ray 10/12/2013. FINDINGS: Lung volumes are normal. No consolidative airspace disease. No pleural effusions. No pneumothorax. No pulmonary nodule or mass noted. Pulmonary vasculature and the cardiomediastinal silhouette are within normal limits. Aortic atherosclerosis. IMPRESSION: 1. No radiographic evidence of acute cardiopulmonary disease. 2. Aortic atherosclerosis. Electronically Signed   By: Vinnie Langton M.D.   On: 01/02/2017 20:08   Ct Head Wo Contrast  Result Date: 01/02/2017 CLINICAL DATA:  Confusion.  Generalized weakness. EXAM: CT HEAD WITHOUT CONTRAST TECHNIQUE: Contiguous axial images were obtained from the base of the skull through the vertex without intravenous contrast. COMPARISON:  None. FINDINGS: Brain: No evidence of acute  infarction, hemorrhage, hydrocephalus, extra-axial collection or mass lesion/mass effect. Generalized atrophy, normal for age. Mild chronic small vessel ischemia for age. Vascular: Atherosclerosis of skullbase vasculature without hyperdense vessel or abnormal calcification. Skull: No skull fracture.  No focal lesion. Sinuses/Orbits: Trace mucosal thickening of ethmoid air cells. The mastoid air cells are clear. The visualized orbits are unremarkable. Other: None. IMPRESSION: Generalized atrophy. Mild chronic small vessel ischemia. No acute intracranial abnormality. Electronically Signed   By: Jeb Levering M.D.   On: 01/02/2017 20:23   Ct Angio Chest Pe W And/or Wo Contrast  Result Date: 01/02/2017 CLINICAL DATA:  83 year old with shortness of breath and chest pain. Generalized weakness. EXAM: CT ANGIOGRAPHY CHEST WITH CONTRAST TECHNIQUE: Multidetector CT imaging of the chest was performed using the standard protocol during bolus administration of intravenous contrast. Multiplanar CT image reconstructions and MIPs were obtained to evaluate the vascular anatomy. CONTRAST:  70 cc Isovue 370 IV COMPARISON:  Radiograph earlier this day.  Chest CT 08/20/2015 FINDINGS: Cardiovascular: There are no filling defects within the pulmonary arteries to suggest pulmonary embolus. Atherosclerosis and tortuosity of the thoracic aorta. No dissection or aneurysm. There are coronary artery calcifications. Heart size is normal. No pericardial effusion. Mediastinum/Nodes: Shotty right hilar nodes, largest measuring 9 mm. No additional mediastinal adenopathy. The esophagus is decompressed, small hiatal hernia. Visualized thyroid gland is normal. Lungs/Pleura: Minimal emphysema. Biapical pleuroparenchymal scarring, right greater than left. Mild atelectasis at the left lung base. Tiny right middle lobe nodule image 99 series 6 is unchanged dating back to 2015 and considered benign. No confluent consolidation. No pulmonary edema. No  pleural fluid. Upper Abdomen: Prominent spleen spanning 13 cm. Small hiatal hernia. Musculoskeletal: There are no acute or suspicious osseous abnormalities. Degenerative change in the spine. Review of the MIP images confirms the above findings. IMPRESSION: 1. No pulmonary embolus. 2. Mild emphysema.  Shotty right hilar nodes, likely reactive. 3. Coronary artery calcifications and aortic atherosclerosis. 4. Incidental mild splenomegaly in the included upper abdomen. This is new from 40. Aortic Atherosclerosis (ICD10-I70.0) and Emphysema (ICD10-J43.9). Electronically Signed   By: Jeb Levering M.D.   On: 01/02/2017 22:36   Dg Chest Port 1 View  Result Date: 01/03/2017 CLINICAL DATA:  Shortness of breath tonight. EXAM: PORTABLE CHEST 1 VIEW COMPARISON:  CT 3 hours prior. FINDINGS: Lower lung volumes from prior exam. Unchanged heart size and mediastinal contours with atherosclerosis of the aortic arch. Increasing left basilar atelectasis. Bronchovascular  crowding related to low lung volumes. No pleural effusion or pneumothorax. IMPRESSION: Increasing left basilar atelectasis. Electronically Signed   By: Jeb Levering M.D.   On: 01/03/2017 01:16    Cardiac Studies   Echo 01/03/17: Study Conclusions  - Left ventricle: The cavity size was normal. There was moderate   focal basal hypertrophy. Systolic function was normal. The   estimated ejection fraction was in the range of 60% to 65%. Wall   motion was normal; there were no regional wall motion   abnormalities. There was an increased relative contribution of   atrial contraction to ventricular filling. Doppler parameters are   consistent with abnormal left ventricular relaxation (grade 1   diastolic dysfunction). Doppler parameters are consistent with   high ventricular filling pressure. - Aortic valve: Severely calcified annulus. Trileaflet; normal   thickness, mildly calcified leaflets. There was mild stenosis.   There was trivial  regurgitation. Mean gradient (S): 12 mm Hg.   Valve area (VTI): 1.91 cm^2. Valve area (Vmax): 1.52 cm^2. Valve   area (Vmean): 1.52 cm^2. - Aorta: Aortic root dimension: 41 mm (ED). - Aortic root: The aortic root was mildly dilated.   LHC 02/09/15: Diagnostic Diagram       Post-Intervention Diagram          Patient Profile     Mr. Mollica is an 54M with CAD s/p PCI of the RCA 01/2015, medically managed LAD disease, hypertension, hyperlipidemia, prior TIA and mild aortic stenosis here with exertional dyspnea and occasional angina.  Assessment & Plan    # Shortness of breath:  # CAD s/p PCI: # Angina: Echo showed normal systolic function and grade 1 diastolic dysfunction.  Continue aspirin, clopidogrel, atorvastatin, metoprolol and Imdur.  He still has a know 70% LAD lesion that is medially managed.  He notes that his shortness of breath didn't improve completely after his PCI in 2016.  However it is much worse and now associated with angina.  We discussed the options of LHC, stress and medical management.  He and his son have discussed and he would like to proceed with LHC.  Continue aspirin, atorvastatin, clopidogrel, metoprolol and Imdur.  # Hypertension:  BP controlled.  Continue metoprolol.  # Hyperlipidemia:  LDL 65 03/2016. Continue atorvastatin.  # Mild aortic stenosis:  Mild on echo 7/25.  Mean gradient 63mmHg.  Signed, Skeet Latch, MD  01/04/2017, 8:48 AM

## 2017-01-04 NOTE — H&P (View-Only) (Signed)
Progress Note  Patient Name: Seth Abbott Date of Encounter: 01/04/2017  Primary Cardiologist: [name]  Subjective   Feeling well.  No chest pain or shortness of breath.  Hasn't been out of the bed.   Inpatient Medications    Scheduled Meds: . aspirin  81 mg Oral q morning - 10a  . atorvastatin  20 mg Oral QHS  . clopidogrel  75 mg Oral Daily  . enoxaparin (LOVENOX) injection  40 mg Subcutaneous Q24H  . insulin aspart  0-15 Units Subcutaneous TID WC  . insulin aspart  0-5 Units Subcutaneous QHS  . insulin NPH Human  18 Units Subcutaneous BID AC  . isosorbide mononitrate  15 mg Oral Daily  . metoprolol succinate  25 mg Oral Daily  . multivitamin with minerals  1 tablet Oral Daily  . pantoprazole  20 mg Oral Daily  . pneumococcal 23 valent vaccine  0.5 mL Intramuscular Tomorrow-1000   Continuous Infusions: . cefTRIAXone (ROCEPHIN)  IV 1 g (01/04/17 0114)   PRN Meds: acetaminophen, albuterol, dextromethorphan-guaiFENesin, hydrALAZINE, Melatonin, nitroGLYCERIN, ondansetron   Vital Signs    Vitals:   01/03/17 2033 01/04/17 0012 01/04/17 0610 01/04/17 0748  BP: (!) 118/52 (!) 138/55 (!) 147/70 (!) 125/57  Pulse: 91 89 82 76  Resp: 18 18 20 18   Temp: 99.6 F (37.6 C) 98.2 F (36.8 C) 98.1 F (36.7 C) (!) 97.5 F (36.4 C)  TempSrc: Oral Oral Oral Oral  SpO2: 97% 96% 98% 98%  Weight:   79.6 kg (175 lb 6.4 oz)   Height:        Intake/Output Summary (Last 24 hours) at 01/04/17 0848 Last data filed at 01/04/17 0804  Gross per 24 hour  Intake           1597.5 ml  Output              875 ml  Net            722.5 ml   Filed Weights   01/03/17 0204 01/04/17 0610  Weight: 79.2 kg (174 lb 8 oz) 79.6 kg (175 lb 6.4 oz)    Telemetry    Sinus rhythm.  Ventricular trigeminy. - Personally Reviewed  ECG    Sinus rhythm.  RBBB.  Rate 76 bpm - Personally Reviewed  Physical Exam   GEN: Well-appearing.  No acute distress.   Neck: No JVD Cardiac: RRR, no murmurs,  rubs, or gallops.  Respiratory: Clear to auscultation bilaterally. GI: Soft, nontender, non-distended  MS: No edema; No deformity. Neuro:  Nonfocal  Psych: Normal affect   Labs    Chemistry Recent Labs Lab 01/02/17 1932 01/04/17 0422  NA 128* 128*  K 4.3 4.2  CL 97* 100*  CO2 22 22  GLUCOSE 251* 140*  BUN 21* 13  CREATININE 1.14 1.00  CALCIUM 8.5* 7.9*  PROT 5.8*  --   ALBUMIN 3.0*  --   AST 29  --   ALT 31  --   ALKPHOS 101  --   BILITOT 1.3*  --   GFRNONAA 56* >60  GFRAA >60 >60  ANIONGAP 9 6     Hematology Recent Labs Lab 01/02/17 1932 01/03/17 0554  WBC 4.8 2.9*  RBC 4.15* 4.03*  HGB 12.1* 11.8*  HCT 34.8* 34.4*  MCV 83.9 85.4  MCH 29.2 29.3  MCHC 34.8 34.3  RDW 15.7* 15.7*  PLT 122* 109*    Cardiac Enzymes Recent Labs Lab 01/02/17 1932  TROPONINI <0.03  No results for input(s): TROPIPOC in the last 168 hours.   BNP Recent Labs Lab 01/02/17 1932  BNP 19.8     DDimer  Recent Labs Lab 01/02/17 1932  DDIMER 2.11*     Radiology    Dg Chest 2 View  Result Date: 01/02/2017 CLINICAL DATA:  83 year old male with history of generalize weakness and hyperglycemia. EXAM: CHEST  2 VIEW COMPARISON:  Chest x-ray 10/12/2013. FINDINGS: Lung volumes are normal. No consolidative airspace disease. No pleural effusions. No pneumothorax. No pulmonary nodule or mass noted. Pulmonary vasculature and the cardiomediastinal silhouette are within normal limits. Aortic atherosclerosis. IMPRESSION: 1. No radiographic evidence of acute cardiopulmonary disease. 2. Aortic atherosclerosis. Electronically Signed   By: Vinnie Langton M.D.   On: 01/02/2017 20:08   Ct Head Wo Contrast  Result Date: 01/02/2017 CLINICAL DATA:  Confusion.  Generalized weakness. EXAM: CT HEAD WITHOUT CONTRAST TECHNIQUE: Contiguous axial images were obtained from the base of the skull through the vertex without intravenous contrast. COMPARISON:  None. FINDINGS: Brain: No evidence of acute  infarction, hemorrhage, hydrocephalus, extra-axial collection or mass lesion/mass effect. Generalized atrophy, normal for age. Mild chronic small vessel ischemia for age. Vascular: Atherosclerosis of skullbase vasculature without hyperdense vessel or abnormal calcification. Skull: No skull fracture.  No focal lesion. Sinuses/Orbits: Trace mucosal thickening of ethmoid air cells. The mastoid air cells are clear. The visualized orbits are unremarkable. Other: None. IMPRESSION: Generalized atrophy. Mild chronic small vessel ischemia. No acute intracranial abnormality. Electronically Signed   By: Jeb Levering M.D.   On: 01/02/2017 20:23   Ct Angio Chest Pe W And/or Wo Contrast  Result Date: 01/02/2017 CLINICAL DATA:  83 year old with shortness of breath and chest pain. Generalized weakness. EXAM: CT ANGIOGRAPHY CHEST WITH CONTRAST TECHNIQUE: Multidetector CT imaging of the chest was performed using the standard protocol during bolus administration of intravenous contrast. Multiplanar CT image reconstructions and MIPs were obtained to evaluate the vascular anatomy. CONTRAST:  70 cc Isovue 370 IV COMPARISON:  Radiograph earlier this day.  Chest CT 08/20/2015 FINDINGS: Cardiovascular: There are no filling defects within the pulmonary arteries to suggest pulmonary embolus. Atherosclerosis and tortuosity of the thoracic aorta. No dissection or aneurysm. There are coronary artery calcifications. Heart size is normal. No pericardial effusion. Mediastinum/Nodes: Shotty right hilar nodes, largest measuring 9 mm. No additional mediastinal adenopathy. The esophagus is decompressed, small hiatal hernia. Visualized thyroid gland is normal. Lungs/Pleura: Minimal emphysema. Biapical pleuroparenchymal scarring, right greater than left. Mild atelectasis at the left lung base. Tiny right middle lobe nodule image 99 series 6 is unchanged dating back to 2015 and considered benign. No confluent consolidation. No pulmonary edema. No  pleural fluid. Upper Abdomen: Prominent spleen spanning 13 cm. Small hiatal hernia. Musculoskeletal: There are no acute or suspicious osseous abnormalities. Degenerative change in the spine. Review of the MIP images confirms the above findings. IMPRESSION: 1. No pulmonary embolus. 2. Mild emphysema.  Shotty right hilar nodes, likely reactive. 3. Coronary artery calcifications and aortic atherosclerosis. 4. Incidental mild splenomegaly in the included upper abdomen. This is new from 30. Aortic Atherosclerosis (ICD10-I70.0) and Emphysema (ICD10-J43.9). Electronically Signed   By: Jeb Levering M.D.   On: 01/02/2017 22:36   Dg Chest Port 1 View  Result Date: 01/03/2017 CLINICAL DATA:  Shortness of breath tonight. EXAM: PORTABLE CHEST 1 VIEW COMPARISON:  CT 3 hours prior. FINDINGS: Lower lung volumes from prior exam. Unchanged heart size and mediastinal contours with atherosclerosis of the aortic arch. Increasing left basilar atelectasis. Bronchovascular  crowding related to low lung volumes. No pleural effusion or pneumothorax. IMPRESSION: Increasing left basilar atelectasis. Electronically Signed   By: Jeb Levering M.D.   On: 01/03/2017 01:16    Cardiac Studies   Echo 01/03/17: Study Conclusions  - Left ventricle: The cavity size was normal. There was moderate   focal basal hypertrophy. Systolic function was normal. The   estimated ejection fraction was in the range of 60% to 65%. Wall   motion was normal; there were no regional wall motion   abnormalities. There was an increased relative contribution of   atrial contraction to ventricular filling. Doppler parameters are   consistent with abnormal left ventricular relaxation (grade 1   diastolic dysfunction). Doppler parameters are consistent with   high ventricular filling pressure. - Aortic valve: Severely calcified annulus. Trileaflet; normal   thickness, mildly calcified leaflets. There was mild stenosis.   There was trivial  regurgitation. Mean gradient (S): 12 mm Hg.   Valve area (VTI): 1.91 cm^2. Valve area (Vmax): 1.52 cm^2. Valve   area (Vmean): 1.52 cm^2. - Aorta: Aortic root dimension: 41 mm (ED). - Aortic root: The aortic root was mildly dilated.   LHC 02/09/15: Diagnostic Diagram       Post-Intervention Diagram          Patient Profile     Mr. Mccarry is an 38M with CAD s/p PCI of the RCA 01/2015, medically managed LAD disease, hypertension, hyperlipidemia, prior TIA and mild aortic stenosis here with exertional dyspnea and occasional angina.  Assessment & Plan    # Shortness of breath:  # CAD s/p PCI: # Angina: Echo showed normal systolic function and grade 1 diastolic dysfunction.  Continue aspirin, clopidogrel, atorvastatin, metoprolol and Imdur.  He still has a know 70% LAD lesion that is medially managed.  He notes that his shortness of breath didn't improve completely after his PCI in 2016.  However it is much worse and now associated with angina.  We discussed the options of LHC, stress and medical management.  He and his son have discussed and he would like to proceed with LHC.  Continue aspirin, atorvastatin, clopidogrel, metoprolol and Imdur.  # Hypertension:  BP controlled.  Continue metoprolol.  # Hyperlipidemia:  LDL 65 03/2016. Continue atorvastatin.  # Mild aortic stenosis:  Mild on echo 7/25.  Mean gradient 65mmHg.  Signed, Skeet Latch, MD  01/04/2017, 8:48 AM

## 2017-01-05 ENCOUNTER — Encounter (HOSPITAL_COMMUNITY): Payer: Self-pay | Admitting: Interventional Cardiology

## 2017-01-05 DIAGNOSIS — I2510 Atherosclerotic heart disease of native coronary artery without angina pectoris: Secondary | ICD-10-CM

## 2017-01-05 LAB — GLUCOSE, CAPILLARY
GLUCOSE-CAPILLARY: 260 mg/dL — AB (ref 65–99)
GLUCOSE-CAPILLARY: 239 mg/dL — AB (ref 65–99)
Glucose-Capillary: 207 mg/dL — ABNORMAL HIGH (ref 65–99)
Glucose-Capillary: 294 mg/dL — ABNORMAL HIGH (ref 65–99)
Glucose-Capillary: 320 mg/dL — ABNORMAL HIGH (ref 65–99)

## 2017-01-05 LAB — CBC
HCT: 34.4 % — ABNORMAL LOW (ref 39.0–52.0)
Hemoglobin: 11.6 g/dL — ABNORMAL LOW (ref 13.0–17.0)
MCH: 28.5 pg (ref 26.0–34.0)
MCHC: 33.7 g/dL (ref 30.0–36.0)
MCV: 84.5 fL (ref 78.0–100.0)
PLATELETS: 147 10*3/uL — AB (ref 150–400)
RBC: 4.07 MIL/uL — ABNORMAL LOW (ref 4.22–5.81)
RDW: 15.8 % — AB (ref 11.5–15.5)
WBC: 4.9 10*3/uL (ref 4.0–10.5)

## 2017-01-05 LAB — BASIC METABOLIC PANEL
Anion gap: 5 (ref 5–15)
BUN: 11 mg/dL (ref 6–20)
CALCIUM: 7.9 mg/dL — AB (ref 8.9–10.3)
CO2: 23 mmol/L (ref 22–32)
Chloride: 99 mmol/L — ABNORMAL LOW (ref 101–111)
Creatinine, Ser: 1.04 mg/dL (ref 0.61–1.24)
GFR calc Af Amer: >60 mL/min (ref >60)
GLUCOSE: 211 mg/dL — AB (ref 65–99)
Potassium: 4.2 mmol/L (ref 3.5–5.1)
Sodium: 127 mmol/L — ABNORMAL LOW (ref 135–145)

## 2017-01-05 MED ORDER — INSULIN NPH (HUMAN) (ISOPHANE) 100 UNIT/ML ~~LOC~~ SUSP
22.0000 [IU] | Freq: Two times a day (BID) | SUBCUTANEOUS | Status: DC
  Administered 2017-01-05: 22 [IU] via SUBCUTANEOUS

## 2017-01-05 MED ORDER — ISOSORBIDE MONONITRATE ER 30 MG PO TB24: 30.0000 mg | ORAL_TABLET | Freq: Every day | ORAL | 0 refills | Status: DC

## 2017-01-05 MED ORDER — INSULIN ASPART 100 UNIT/ML ~~LOC~~ SOLN
4.0000 [IU] | Freq: Once | SUBCUTANEOUS | Status: AC
  Administered 2017-01-05: 4 [IU] via SUBCUTANEOUS

## 2017-01-05 NOTE — Discharge Instructions (Signed)

## 2017-01-05 NOTE — Progress Notes (Signed)
Patient blood glucose is rising, NP notified.

## 2017-01-05 NOTE — Progress Notes (Signed)
Physical Therapy Treatment Patient Details Name: Seth Abbott MRN: [id number] DOB: 1927-11-10 Today's Date: 01/05/2017    History of Present Illness 83 year old male, PMH of HTN, HLD, TIA, GERD, CAD status post stent, presented with complaints of progressively worsening DOE of several weeks duration, chronic intermittent chest pain and transient confusion. s/p cardiac cath 7/26    PT Comments    Pt progressing well with ability to perform gait with use of RW. Pt with noted instability in standing and need for assist and use of RW at all times. Pt with significant improvement from eval and son present to confirm PLOF and 24hr assist available at home. Educated for need for RW at all times and recommend daily mobility with nursing assist. Will continue to follow to maximize independence.   HR 84 93% on RA no SOB noted with activity    Follow Up Recommendations  Home health PT;Supervision/Assistance - 24 hour     Equipment Recommendations  None recommended by PT    Recommendations for Other Services OT consult     Precautions / Restrictions Precautions Precautions: Fall    Mobility  Bed Mobility Overal bed mobility: Modified Independent             General bed mobility comments: use of rail   Transfers Overall transfer level: Needs assistance   Transfers: Sit to/from Stand Sit to Stand: Min guard         General transfer comment: cues for hand placement and safety, pt trying to ditch walker with transfer to toilet  Ambulation/Gait Ambulation/Gait assistance: Min guard Ambulation Distance (Feet): 300 Feet Assistive device: Rolling walker (2 wheeled) Gait Pattern/deviations: Step-through pattern;Decreased stride length;Trunk flexed   Gait velocity interpretation: Below normal speed for age/gender General Gait Details: cues for position in RW and safety. Pt walked 200' then 300' after seated rest. Attempted gait without AD for 20' with increased sway and  instability requring min assist for balance   Stairs Stairs: Yes   Stair Management: Forwards;One rail Left;Step to pattern Number of Stairs: 3 General stair comments: cues for sequence, assist for balance and stability with rail   Wheelchair Mobility    Modified Rankin (Stroke Patients Only)       Balance Overall balance assessment: Needs assistance   Sitting balance-Leahy Scale: Good       Standing balance-Leahy Scale: Poor                              Cognition Arousal/Alertness: Awake/alert Behavior During Therapy: Flat affect Overall Cognitive Status: Impaired/Different from baseline Area of Impairment: Orientation                 Orientation Level: Time Current Attention Level: Selective Memory: Decreased short-term memory   Safety/Judgement: Decreased awareness of safety;Decreased awareness of deficits            Exercises      General Comments        Pertinent Vitals/Pain Pain Assessment: No/denies pain    Home Living Family/patient expects to be discharged to:: Private residence Living Arrangements: Alone Available Help at Discharge: Family;Available 24 hours/day Type of Home: House Home Access: Ramped entrance   Home Layout: One level Home Equipment: Walker - 2 wheels;Bedside commode;Wheelchair - manual      Prior Function Level of Independence: Independent with assistive device(s)          PT Goals (current goals can now be found in the  care plan section) Progress towards PT goals: Goals met and updated - see care plan    Frequency    Min 3X/week      PT Plan Discharge plan needs to be updated    Co-evaluation              AM-PAC PT "6 Clicks" Daily Activity  Outcome Measure  Difficulty turning over in bed (including adjusting bedclothes, sheets and blankets)?: A Little Difficulty moving from lying on back to sitting on the side of the bed? : A Little Difficulty sitting down on and standing up  from a chair with arms (e.g., wheelchair, bedside commode, etc,.)?: A Little Help needed moving to and from a bed to chair (including a wheelchair)?: A Little Help needed walking in hospital room?: A Little Help needed climbing 3-5 steps with a railing? : A Little 6 Click Score: 18    End of Session Equipment Utilized During Treatment: Gait belt Activity Tolerance: Patient tolerated treatment well Patient left: in chair;with call bell/phone within reach;with family/visitor present;with chair alarm set Nurse Communication: Mobility status PT Visit Diagnosis: Unsteadiness on feet (R26.81);Other abnormalities of gait and mobility (R26.89);Muscle weakness (generalized) (M62.81)     Time: 1655-3748 PT Time Calculation (min) (ACUTE ONLY): 26 min  Charges:  $Gait Training: 23-37 mins                    G Codes:       Elwyn Reach, PT [phone]   [place] 01/05/2017, 8:09 AM

## 2017-01-05 NOTE — Care Management Note (Signed)
Case Management Note  Patient Details  Name: SUHAYB ANZALONE MRN: [id number] Date of Birth: [birth year]  Action/Plan: CM talked to patient at the bedside, he is agreeable to [facility] services, choice offered, pt chose [facility]; [place] with [facility] called for arrangements; also ordered [facility]ass[facility] Moselle as requested by patient; PCP: [name], DO; has private insurance with Medicare /BCBS with prescription drug coverage; pharmacy of choice is [facility];  Expected Discharge Date:  01/05/17               Expected Discharge Plan:  [employer]  Discharge planning Services  CM Consult  Choice offered to:  Patient  DME Arranged:  Walker rolling DME Agency:  [place]:  RN, PT [facility] Agency:  [facility] (now [facility])  Status of Service:  In process, will continue to follow  Sherrilyn Rist [phone] 01/05/2017, 11:47 AM

## 2017-01-05 NOTE — Progress Notes (Signed)
Progress Note  Patient Name: Seth Abbott Date of Encounter: 01/05/2017  Primary Cardiologist: [name]  Subjective   Feeling well.  No chest pain or shortness of breath.  Hasn't been out of the bed.   Inpatient Medications    Scheduled Meds: . aspirin  81 mg Oral q morning - 10a  . atorvastatin  20 mg Oral QHS  . clopidogrel  75 mg Oral Daily  . enoxaparin (LOVENOX) injection  40 mg Subcutaneous Q24H  . insulin aspart  0-15 Units Subcutaneous TID WC  . insulin aspart  0-5 Units Subcutaneous QHS  . insulin NPH Human  22 Units Subcutaneous BID AC  . isosorbide mononitrate  30 mg Oral Daily  . metoprolol succinate  25 mg Oral Daily  . multivitamin with minerals  1 tablet Oral Daily  . pantoprazole  20 mg Oral Daily  . pneumococcal 23 valent vaccine  0.5 mL Intramuscular Tomorrow-1000  . sodium chloride flush  3 mL Intravenous Q12H   Continuous Infusions: . sodium chloride     PRN Meds: sodium chloride, acetaminophen, albuterol, dextromethorphan-guaiFENesin, hydrALAZINE, Melatonin, nitroGLYCERIN, ondansetron (ZOFRAN) IV, sodium chloride flush   Vital Signs    Vitals:   01/05/17 0139 01/05/17 0200 01/05/17 0524 01/05/17 0805  BP: (!) 144/63 (!) 124/57 (!) 119/59   Pulse: 86 90 89 87  Resp:   20   Temp:   98.2 F (36.8 C)   TempSrc:   Oral   SpO2:   94% 93%  Weight:   173 lb (78.5 kg)   Height:        Intake/Output Summary (Last 24 hours) at 01/05/17 1027 Last data filed at 01/05/17 0925  Gross per 24 hour  Intake             1049 ml  Output             1075 ml  Net              -26 ml   Filed Weights   01/03/17 0204 01/04/17 0610 01/05/17 0524  Weight: 174 lb 8 oz (79.2 kg) 175 lb 6.4 oz (79.6 kg) 173 lb (78.5 kg)    Telemetry    Sinus rhythm.  Ventricular trigeminy. - Personally Reviewed  ECG    Sinus rhythm.  RBBB.  Rate 76 bpm - Personally Reviewed  Physical Exam   GEN: Well-appearing.  No acute distress.   Neck: No JVD Cardiac: RRR, soft  murmur, no rubs, or gallops.  Respiratory: Clear to auscultation bilaterally. GI: Soft, nontender, +distended (pt denies pain, constipation or diarrhea)  MS: No edema; No deformity. R radial cath site with minimal ecchymosis and good pulse Neuro:  Nonfocal  Psych: Normal affect   Labs    Chemistry  Recent Labs Lab 01/02/17 1932 01/04/17 0422 01/05/17 0317  NA 128* 128* 127*  K 4.3 4.2 4.2  CL 97* 100* 99*  CO2 22 22 23   GLUCOSE 251* 140* 211*  BUN 21* 13 11  CREATININE 1.14 1.00 1.04  CALCIUM 8.5* 7.9* 7.9*  PROT 5.8*  --   --   ALBUMIN 3.0*  --   --   AST 29  --   --   ALT 31  --   --   ALKPHOS 101  --   --   BILITOT 1.3*  --   --   GFRNONAA 56* >60 >60  GFRAA >60 >60 >60  ANIONGAP 9 6 5      Hematology  Recent Labs Lab 01/03/17 0554 01/04/17 1031 01/05/17 0317  WBC 2.9* 4.2 4.9  RBC 4.03* 4.13* 4.07*  HGB 11.8* 11.8* 11.6*  HCT 34.4* 35.2* 34.4*  MCV 85.4 85.2 84.5  MCH 29.3 28.6 28.5  MCHC 34.3 33.5 33.7  RDW 15.7* 15.9* 15.8*  PLT 109* 124* 147*    Cardiac Enzymes  Recent Labs Lab 01/02/17 1932  TROPONINI <0.03   BNP  Recent Labs Lab 01/02/17 1932  BNP 19.8     DDimer   Recent Labs Lab 01/02/17 1932  DDIMER 2.11*     Radiology    No results found.  Cardiac Studies   Echo 01/03/17: Study Conclusions - Left ventricle: The cavity size was normal. There was moderate   focal basal hypertrophy. Systolic function was normal. The   estimated ejection fraction was in the range of 60% to 65%. Wall   motion was normal; there were no regional wall motion   abnormalities. There was an increased relative contribution of   atrial contraction to ventricular filling. Doppler parameters are   consistent with abnormal left ventricular relaxation (grade 1   diastolic dysfunction). Doppler parameters are consistent with   high ventricular filling pressure. - Aortic valve: Severely calcified annulus. Trileaflet; normal   thickness, mildly  calcified leaflets. There was mild stenosis.   There was trivial regurgitation. Mean gradient (S): 12 mm Hg.   Valve area (VTI): 1.91 cm^2. Valve area (Vmax): 1.52 cm^2. Valve   area (Vmean): 1.52 cm^2. - Aorta: Aortic root dimension: 41 mm (ED). - Aortic root: The aortic root was mildly dilated.  LHC 01/04/2017  Severe calcific coronary artery disease.  Proximal to mid significant LAD obstructive disease in the 70-80% range that extends across a large diagonal. When compared to the prior angiogram from 2016 there has been mild progression in the proximal segment.  Widely patent circumflex coronary artery.  Widely patent right coronary stent deployment in 2016. Eccentric 50-60% mid to distal RCA.  Normal left ventricular function and end diastolic pressures. LVEDP 9 mmHg pre-ventriculography. RECOMMENDATIONS:  The left anterior descending is significantly obstructed and is heavily calcified. Intervention would be complex including atheroblation with either rotational atherectomy or orbital atherectomy. This would then need to be followed by extremely long segment of stent that will crossover a very large diagonal branch increase in the risk of acute ischemia. Given normal LV function and hemodynamics with mild progression in disease over 2 years, I would recommend up titrating medical therapy, reserving PCI for unstable angina/ACS if that ever develops.  Will reinstitute the patient's DO NOT RESUSCITATE request which I asked to temporarily rescind to allow cath and appropriate management if complications. Diagnostic Diagram         LHC 02/09/15: Diagnostic Diagram       Post-Intervention Diagram          Patient Profile     Seth Abbott is an 55M with CAD s/p PCI of the RCA 01/2015, medically managed LAD disease, hypertension, hyperlipidemia, prior TIA and mild aortic stenosis here with exertional dyspnea and occasional angina.  Assessment & Plan    # Shortness of breath:    # CAD s/p PCI: # Angina: Echo showed normal systolic function and grade 1 diastolic dysfunction.  Continue aspirin, clopidogrel, atorvastatin, metoprolol and Imdur. He still has a know 70% LAD lesion that is medially managed.  He notes that his shortness of breath didn't improve completely after his PCI in 2016.  However it is much worse and  now associated with angina.  We discussed the options of LHC, stress and medical management. S/P LHC with results above. Continue aspirin, atorvastatin, clopidogrel, metoprolol and Imdur - dose increased 15 mg>>30 mg 07/26.  # Hypertension:  BP above goal about half the time.  Continue metoprolol but will increase the dose to 25 mg bid, HR is never low  # Hyperlipidemia:  LDL 65 03/2016. Continue atorvastatin.  # Mild aortic stenosis:  Mild on echo 7/25.  Mean gradient 52mmHg.  Signed, Rosaria Ferries, PA-C  01/05/2017, 10:27 AM

## 2017-01-05 NOTE — Discharge Summary (Signed)
Physician Discharge Summary  Seth Abbott YYT:[id number] DOB: 1927-10-10  PCP: [name], DO  Admit date: 01/02/2017 Discharge date: 01/05/2017  Recommendations for Outpatient Follow-up:  1. Dr. Neoma Laming, PCPIn 5 days with repeat labs (CBC & BMP). Please follow final blood culture results that were sent from the hospital. 2. Dr. Fransico Him, Cardiology: Office will arrange outpatient follow-up in 2 weeks.  Home Health: PT Equipment/Devices: None    Discharge Condition: Improved and stable  CODE STATUS: DO NOT RESUSCITATE  Diet recommendation: Heart healthy and diabetic diet.  Discharge Diagnoses:  Principal Problem:   S/P coronary artery stent placement Active Problems:   Diabetes mellitus without complication (HCC)   TIA (transient ischemic attack)   HTN (hypertension)   Hyperlipidemia   Aortic stenosis, mild   Coronary artery calcification seen on CAT scan   CAD (coronary artery disease), native coronary artery   SOB (shortness of breath)   Sepsis ([place])   Acute metabolic encephalopathy   Hyponatremia   Bronchitis   Brief Summary: 83 year old male, PMH of HTN, HLD, TIA, GERD, CAD status post stent, No significant AS by echo June 2018, presented with complaints of progressively worsening DOE of several weeks duration, chronic intermittent chest pain and transient confusion. Patient and son provided history. He ambulates with help of walker. No strokelike symptoms. Mild cough with intermittent minimal white sputum without fever or chills. In ED noted fever of 100.81F. Positive d-dimer, normal lactate, negative troponin and BNP, negative urine analysis, negative chest x-ray, negative CT head for acute findings, CTA chest negative for PE but showed mild emphysema and incidental mild splenomegaly. Admitted for further evaluation and management. Dyspnea and chest pain suspected due to angina. Cardiology consulted.   Assessment & Plan:   1. Worsening dyspnea and chronic  chest pain: Clinically did not feel like acute bronchitis. Not volume overloaded. No clinical bronchospasm. RSV panel negative. Chest x-ray without acute findings. CTA chest: No PE, mild emphysema. Did not feel that he was septic on admission. Pro-calcitonin 0.22.  2-D echo: Normal systolic function and grade 1 diastolic dysfunction. Not hypoxic on room air or with ambulation today. Repeat chest x-ray shows left basilar atelectasis. Incentive spirometry. Cardiology was consulted and agreed that his presentation was concerning for ischemia as cause for his exertional dyspnea and chest pain. After discussing with patient and family, he underwent LHC on 7/26. He has prior history of CAD status post PCI of the RCA 01/2015, medically managed LAD disease. His LHC revealed progression of his LAD disease. Cardiology indicated that intervention was possible but higher risk than a standard PCI. After careful consideration, it was decided to manage him medically. His Imdur was increased from 15-30 mg daily. Discussed with Cardiology who have seen him today and have cleared him for discharge home. He will follow-up with cardiology in 2 weeks.  2. Type II DM:  he was managed with lower than home dose of NPH insulin and SSI. CBGs were uncontrolled and mostly in the 200s. Resume prior home dose of NPH and Humalog insulin. No A1c in system but may have been done at PCPs office and can be followed up there or repeated as needed. 3. Essential hypertension: Mildly uncontrolled. Continue Imdur (dose increased to 30 mg daily) and metoprolol. Holding ramipril due to hyponatremia. 4. Hyponatremia/possible SIADH: Unclear etiology. Brief IV saline but sodium has plateaued at 127-128. Clinically appears euvolemic. Ramipril on hold. Serum osmolarity 275. Urine osmolarity 712. Fluid restriction was discussed in detail with patient and his  son at bedside and they verbalized understanding. Follow BMP in a few days as outpatient. 5. CAD status  post stent: Management as above. Continue aspirin, statins, Plavix, Imdur and metoprolol. Cardiology follow-up appreciated. 6. History of TIA: Continue statins, aspirin and Plavix. 7. Hyperlipidemia: Statins. 8. Transient confusion: CT head without acute abnormalities. Appears to have confusion at night suggesting sundowning and possible dementia. Mental status is back to baseline. 9. Mild splenomegaly: Incidentally noted on CT chest. No GI symptoms. Outpatient follow-up as deemed necessary. 10. Pancytopenia: Unclear etiology.? Acute viral illness versus other etiology. Hemoglobin stable. Leukopenia resolved. Thrombocytopenia improving. 11. Mechanical fall: Unwitnessed fall overnight 7/25 when patient was found sitting on the floor. No injuries sustained or reported. Fall precautions. PT has reevaluated him and recommend home health PT. Discussed in detail with patient and son at bedside and they indicate that between his family members, patient will have 24/7 supervision over the next few days.    Consultants:  Cardiology  Procedures:   2-D echo 01/03/17: Study Conclusions  - Left ventricle: The cavity size was normal. There was moderate focal basal hypertrophy. Systolic function was normal. The estimated ejection fraction was in the range of 60% to 65%. Wall motion was normal; there were no regional wall motion abnormalities. There was an increased relative contribution of atrial contraction to ventricular filling. Doppler parameters are consistent with abnormal left ventricular relaxation (grade 1 diastolic dysfunction). Doppler parameters are consistent with high ventricular filling pressure. - Aortic valve: Severely calcified annulus. Trileaflet; normal thickness, mildly calcified leaflets. There was mild stenosis. There was trivial regurgitation. Mean gradient (S): 12 mm Hg. Valve area (VTI): 1.91 cm^2. Valve area (Vmax): 1.52 cm^2. Valve area (Vmean):  1.52 cm^2. - Aorta: Aortic root dimension: 41 mm (ED). - Aortic root: The aortic root was mildly dilated.  Cardiac cath 01/04/17: Results summarized above.  Discharge Instructions  Discharge Instructions    (HEART FAILURE PATIENTS) Call MD:  Anytime you have any of the following symptoms: 1) 3 pound weight gain in 24 hours or 5 pounds in 1 week 2) shortness of breath, with or without a dry hacking cough 3) swelling in the hands, feet or stomach 4) if you have to sleep on extra pillows at night in order to breathe.    Complete by:  As directed    Call MD for:  difficulty breathing, headache or visual disturbances    Complete by:  As directed    Call MD for:  extreme fatigue    Complete by:  As directed    Call MD for:  persistant dizziness or light-headedness    Complete by:  As directed    Call MD for:  severe uncontrolled pain    Complete by:  As directed    Diet - low sodium heart healthy    Complete by:  As directed    Diet Carb Modified    Complete by:  As directed    Discharge instructions    Complete by:  As directed    Please restrict your overall fluid intake to 1200 mL per day.   Increase activity slowly    Complete by:  As directed        Medication List    STOP taking these medications   ramipril 2.5 MG capsule Commonly known as:  ALTACE     TAKE these medications   ACCU-CHEK AVIVA PLUS test strip Generic drug:  glucose blood USE TO CHECK BLOOD SUGAR 3 TIMES DAILY   aspirin  81 MG tablet Take 81 mg by mouth every morning.   atorvastatin 20 MG tablet Commonly known as:  LIPITOR Take 20 mg by mouth every morning.   clopidogrel 75 MG tablet Commonly known as:  PLAVIX Take 1 tablet (75 mg total) by mouth daily.   HUMALOG 100 UNIT/ML cartridge Generic drug:  insulin lispro Inject 10-15 Units into the skin 2 (two) times daily. Pt ranges his insulin with his blood sugar readings   insulin NPH Human 100 UNIT/ML injection Commonly known as:  HUMULIN  N,NOVOLIN N Inject 25 Units into the skin 2 (two) times daily before a meal.   isosorbide mononitrate 30 MG 24 hr tablet Commonly known as:  IMDUR Take 1 tablet (30 mg total) by mouth daily. What changed:  how much to take   Melatonin 3 MG Tabs Take 3 mg by mouth at bedtime as needed.   metoprolol succinate 25 MG 24 hr tablet Commonly known as:  TOPROL-XL TAKE 1 TABLET (25 MG TOTAL) BY MOUTH DAILY. TAKE WITH OR IMMEDIATELY FOLLOWING A MEAL.   multivitamin with minerals tablet Take 1 tablet by mouth daily.   nitroGLYCERIN 0.4 MG SL tablet Commonly known as:  NITROSTAT Place 1 tablet (0.4 mg total) under the tongue every 5 (five) minutes as needed for chest pain (up to 3 doses).   pantoprazole 20 MG tablet Commonly known as:  PROTONIX TAKE 1 TABLET BY MOUTH EVERY DAY      Follow-up Information    [name], DO. Schedule an appointment as soon as possible for a visit in 5 day(s).   Specialty:  Family Medicine Why:  To be seen with repeat labs (CBC & BMP). Contact information: [place] [address] [phone]        Sueanne Margarita, MD Follow up.   Specialty:  Cardiology Why:  Office will call you for a follow-up appointment in 2 weeks. Please call them back if you don't hear from them by mid next week. Contact information: [address] [address] [phone]          No Known Allergies    Procedures/Studies: Dg Chest 2 View  Result Date: 01/02/2017 CLINICAL DATA:  83 year old male with history of generalize weakness and hyperglycemia. EXAM: CHEST  2 VIEW COMPARISON:  Chest x-ray 10/12/2013. FINDINGS: Lung volumes are normal. No consolidative airspace disease. No pleural effusions. No pneumothorax. No pulmonary nodule or mass noted. Pulmonary vasculature and the cardiomediastinal silhouette are within normal limits. Aortic atherosclerosis. IMPRESSION: 1. No radiographic evidence of acute cardiopulmonary  disease. 2. Aortic atherosclerosis. Electronically Signed   By: Vinnie Langton M.D.   On: 01/02/2017 20:08   Ct Head Wo Contrast  Result Date: 01/02/2017 CLINICAL DATA:  Confusion.  Generalized weakness. EXAM: CT HEAD WITHOUT CONTRAST TECHNIQUE: Contiguous axial images were obtained from the base of the skull through the vertex without intravenous contrast. COMPARISON:  None. FINDINGS: Brain: No evidence of acute infarction, hemorrhage, hydrocephalus, extra-axial collection or mass lesion/mass effect. Generalized atrophy, normal for age. Mild chronic small vessel ischemia for age. Vascular: Atherosclerosis of skullbase vasculature without hyperdense vessel or abnormal calcification. Skull: No skull fracture.  No focal lesion. Sinuses/Orbits: Trace mucosal thickening of ethmoid air cells. The mastoid air cells are clear. The visualized orbits are unremarkable. Other: None. IMPRESSION: Generalized atrophy. Mild chronic small vessel ischemia. No acute intracranial abnormality. Electronically Signed   By: Jeb Levering M.D.   On: 01/02/2017 20:23   Ct  Angio Chest Pe W And/or Wo Contrast  Result Date: 01/02/2017 CLINICAL DATA:  83 year old with shortness of breath and chest pain. Generalized weakness. EXAM: CT ANGIOGRAPHY CHEST WITH CONTRAST TECHNIQUE: Multidetector CT imaging of the chest was performed using the standard protocol during bolus administration of intravenous contrast. Multiplanar CT image reconstructions and MIPs were obtained to evaluate the vascular anatomy. CONTRAST:  70 cc Isovue 370 IV COMPARISON:  Radiograph earlier this day.  Chest CT 08/20/2015 FINDINGS: Cardiovascular: There are no filling defects within the pulmonary arteries to suggest pulmonary embolus. Atherosclerosis and tortuosity of the thoracic aorta. No dissection or aneurysm. There are coronary artery calcifications. Heart size is normal. No pericardial effusion. Mediastinum/Nodes: Shotty right hilar nodes, largest measuring  9 mm. No additional mediastinal adenopathy. The esophagus is decompressed, small hiatal hernia. Visualized thyroid gland is normal. Lungs/Pleura: Minimal emphysema. Biapical pleuroparenchymal scarring, right greater than left. Mild atelectasis at the left lung base. Tiny right middle lobe nodule image 99 series 6 is unchanged dating back to 2015 and considered benign. No confluent consolidation. No pulmonary edema. No pleural fluid. Upper Abdomen: Prominent spleen spanning 13 cm. Small hiatal hernia. Musculoskeletal: There are no acute or suspicious osseous abnormalities. Degenerative change in the spine. Review of the MIP images confirms the above findings. IMPRESSION: 1. No pulmonary embolus. 2. Mild emphysema.  Shotty right hilar nodes, likely reactive. 3. Coronary artery calcifications and aortic atherosclerosis. 4. Incidental mild splenomegaly in the included upper abdomen. This is new from 73. Aortic Atherosclerosis (ICD10-I70.0) and Emphysema (ICD10-J43.9). Electronically Signed   By: Jeb Levering M.D.   On: 01/02/2017 22:36   Dg Chest Port 1 View  Result Date: 01/03/2017 CLINICAL DATA:  Shortness of breath tonight. EXAM: PORTABLE CHEST 1 VIEW COMPARISON:  CT 3 hours prior. FINDINGS: Lower lung volumes from prior exam. Unchanged heart size and mediastinal contours with atherosclerosis of the aortic arch. Increasing left basilar atelectasis. Bronchovascular crowding related to low lung volumes. No pleural effusion or pneumothorax. IMPRESSION: Increasing left basilar atelectasis. Electronically Signed   By: Jeb Levering M.D.   On: 01/03/2017 01:16      Subjective: Patient denies chest pain or dyspnea even with exertion. He ambulated a couple times in the hallway with PT and reports no symptoms. No dizziness or lightheadedness. As per discussion with RN, not hypoxic with ambulation on room air. Overall feels much better. No other complaints reported.  Discharge Exam:  Vitals:   01/05/17  0139 01/05/17 0200 01/05/17 0524 01/05/17 0805  BP: (!) 144/63 (!) 124/57 (!) 119/59   Pulse: 86 90 89 87  Resp:   20   Temp:   98.2 F (36.8 C)   TempSrc:   Oral   SpO2:   94% 93%  Weight:   78.5 kg (173 lb)   Height:        General exam: Pleasant elderly male sitting up comfortably in chair this morning. Respiratory system: Clear to auscultation. Respiratory effort normal. Cardiovascular system: S1 & S2 heard, RRR. No JVD, murmurs, rubs, gallops or clicks. No pedal edema. Telemetry: Sinus rhythm with occasional PVCs .  Gastrointestinal system: Abdomen is nondistended, soft and nontender. No organomegaly or masses felt. Normal bowel sounds heard. Central nervous system: Alert and oriented 3. No focal neurological deficits. Extremities: Symmetric 5 x 5 power. Skin: No rashes, lesions or ulcers. No obvious injuries noted. Psychiatry: Judgement and insight appear normal. Mood & affect flat.     The results of significant diagnostics from this hospitalization (including  imaging, microbiology, ancillary and laboratory) are listed below for reference.     Microbiology: Recent Results (from the past 240 hour(s))  Culture, blood (routine x 2) Call MD if unable to obtain prior to antibiotics being given     Status: None (Preliminary result)   Collection Time: 01/03/17  2:10 AM  Result Value Ref Range Status   Specimen Description BLOOD RIGHT WRIST  Final   Special Requests   Final    BOTTLES DRAWN AEROBIC AND ANAEROBIC Blood Culture adequate volume   Culture NO GROWTH 1 DAY  Final   Report Status PENDING  Incomplete  Respiratory Panel by PCR     Status: None   Collection Time: 01/03/17  2:27 AM  Result Value Ref Range Status   Adenovirus NOT DETECTED NOT DETECTED Final   Coronavirus 229E NOT DETECTED NOT DETECTED Final   Coronavirus HKU1 NOT DETECTED NOT DETECTED Final   Coronavirus NL63 NOT DETECTED NOT DETECTED Final   Coronavirus OC43 NOT DETECTED NOT DETECTED Final    Metapneumovirus NOT DETECTED NOT DETECTED Final   Rhinovirus / Enterovirus NOT DETECTED NOT DETECTED Final   Influenza A NOT DETECTED NOT DETECTED Final   Influenza B NOT DETECTED NOT DETECTED Final   Parainfluenza Virus 1 NOT DETECTED NOT DETECTED Final   Parainfluenza Virus 2 NOT DETECTED NOT DETECTED Final   Parainfluenza Virus 3 NOT DETECTED NOT DETECTED Final   Parainfluenza Virus 4 NOT DETECTED NOT DETECTED Final   Respiratory Syncytial Virus NOT DETECTED NOT DETECTED Final   Bordetella pertussis NOT DETECTED NOT DETECTED Final   Chlamydophila pneumoniae NOT DETECTED NOT DETECTED Final   Mycoplasma pneumoniae NOT DETECTED NOT DETECTED Final  Culture, blood (routine x 2) Call MD if unable to obtain prior to antibiotics being given     Status: None (Preliminary result)   Collection Time: 01/03/17  2:27 AM  Result Value Ref Range Status   Specimen Description BLOOD LEFT WRIST  Final   Special Requests   Final    BOTTLES DRAWN AEROBIC AND ANAEROBIC Blood Culture results may not be optimal due to an inadequate volume of blood received in culture bottles   Culture NO GROWTH 1 DAY  Final   Report Status PENDING  Incomplete     Labs: CBC:  Recent Labs Lab 01/02/17 1932 01/03/17 0554 01/04/17 1031 01/05/17 0317  WBC 4.8 2.9* 4.2 4.9  NEUTROABS 1.5*  --   --   --   HGB 12.1* 11.8* 11.8* 11.6*  HCT 34.8* 34.4* 35.2* 34.4*  MCV 83.9 85.4 85.2 84.5  PLT 122* 109* 124* 496*   Basic Metabolic Panel:  Recent Labs Lab 01/02/17 1932 01/04/17 0422 01/05/17 0317  NA 128* 128* 127*  K 4.3 4.2 4.2  CL 97* 100* 99*  CO2 22 22 23   GLUCOSE 251* 140* 211*  BUN 21* 13 11  CREATININE 1.14 1.00 1.04  CALCIUM 8.5* 7.9* 7.9*   Liver Function Tests:  Recent Labs Lab 01/02/17 1932  AST 29  ALT 31  ALKPHOS 101  BILITOT 1.3*  PROT 5.8*  ALBUMIN 3.0*   BNP (last 3 results)  Recent Labs  01/02/17 1932  BNP 19.8   Cardiac Enzymes:  Recent Labs Lab 01/02/17 1932   TROPONINI <0.03   CBG:  Recent Labs Lab 01/04/17 2249 01/05/17 0012 01/05/17 0143 01/05/17 0231 01/05/17 0743  GLUCAP 296* 320* 260* 239* 207*   Thyroid function studies  Recent Labs  01/03/17 0227  TSH 4.175   Urinalysis  Component Value Date/Time   COLORURINE YELLOW 01/02/2017 1940   APPEARANCEUR CLEAR 01/02/2017 1940   LABSPEC 1.023 01/02/2017 1940   PHURINE 6.0 01/02/2017 1940   GLUCOSEU >=500 (A) 01/02/2017 1940   HGBUR SMALL (A) 01/02/2017 1940   BILIRUBINUR NEGATIVE 01/02/2017 1940   KETONESUR 5 (A) 01/02/2017 1940   PROTEINUR 100 (A) 01/02/2017 1940   UROBILINOGEN 1.0 10/12/2013 1954   NITRITE NEGATIVE 01/02/2017 1940   LEUKOCYTESUR NEGATIVE 01/02/2017 1940    Discussed in detail with patient's son at bedside. Updated care and answered questions.  Time coordinating discharge: Over 30 minutes  SIGNED:  Vernell Leep, MD, FACP, [place]. [facility] Pager [phone]  If 7PM-7AM, please contact night-coverage [email or web address] Password [facility] 01/05/2017, 11:36 AM

## 2017-01-05 NOTE — Progress Notes (Signed)
Patient and son given discharge instructions and all questions answered.  Patient discharged via wheelchair with all belongings.

## 2017-01-06 DIAGNOSIS — E1165 Type 2 diabetes mellitus with hyperglycemia: Secondary | ICD-10-CM | POA: Diagnosis not present

## 2017-01-06 DIAGNOSIS — I10 Essential (primary) hypertension: Secondary | ICD-10-CM | POA: Diagnosis not present

## 2017-01-06 DIAGNOSIS — Z7982 Long term (current) use of aspirin: Secondary | ICD-10-CM | POA: Diagnosis not present

## 2017-01-06 DIAGNOSIS — J439 Emphysema, unspecified: Secondary | ICD-10-CM | POA: Diagnosis not present

## 2017-01-06 DIAGNOSIS — I2510 Atherosclerotic heart disease of native coronary artery without angina pectoris: Secondary | ICD-10-CM | POA: Diagnosis not present

## 2017-01-06 DIAGNOSIS — I700 Atherosclerosis of aorta: Secondary | ICD-10-CM | POA: Diagnosis not present

## 2017-01-07 ENCOUNTER — Other Ambulatory Visit: Payer: Self-pay | Admitting: Cardiology

## 2017-01-08 DIAGNOSIS — Z7982 Long term (current) use of aspirin: Secondary | ICD-10-CM | POA: Diagnosis not present

## 2017-01-08 DIAGNOSIS — I700 Atherosclerosis of aorta: Secondary | ICD-10-CM | POA: Diagnosis not present

## 2017-01-08 DIAGNOSIS — J439 Emphysema, unspecified: Secondary | ICD-10-CM | POA: Diagnosis not present

## 2017-01-08 DIAGNOSIS — E1165 Type 2 diabetes mellitus with hyperglycemia: Secondary | ICD-10-CM | POA: Diagnosis not present

## 2017-01-08 DIAGNOSIS — I10 Essential (primary) hypertension: Secondary | ICD-10-CM | POA: Diagnosis not present

## 2017-01-08 DIAGNOSIS — I2510 Atherosclerotic heart disease of native coronary artery without angina pectoris: Secondary | ICD-10-CM | POA: Diagnosis not present

## 2017-01-08 LAB — CULTURE, BLOOD (ROUTINE X 2)
CULTURE: NO GROWTH
CULTURE: NO GROWTH
Special Requests: ADEQUATE

## 2017-01-10 DIAGNOSIS — I10 Essential (primary) hypertension: Secondary | ICD-10-CM | POA: Diagnosis not present

## 2017-01-10 DIAGNOSIS — I700 Atherosclerosis of aorta: Secondary | ICD-10-CM | POA: Diagnosis not present

## 2017-01-10 DIAGNOSIS — I2510 Atherosclerotic heart disease of native coronary artery without angina pectoris: Secondary | ICD-10-CM | POA: Diagnosis not present

## 2017-01-10 DIAGNOSIS — Z7982 Long term (current) use of aspirin: Secondary | ICD-10-CM | POA: Diagnosis not present

## 2017-01-10 DIAGNOSIS — E1165 Type 2 diabetes mellitus with hyperglycemia: Secondary | ICD-10-CM | POA: Diagnosis not present

## 2017-01-10 DIAGNOSIS — J439 Emphysema, unspecified: Secondary | ICD-10-CM | POA: Diagnosis not present

## 2017-01-11 ENCOUNTER — Inpatient Hospital Stay: Payer: Medicare Other | Admitting: Family Medicine

## 2017-01-16 DIAGNOSIS — E1165 Type 2 diabetes mellitus with hyperglycemia: Secondary | ICD-10-CM | POA: Diagnosis not present

## 2017-01-16 DIAGNOSIS — J439 Emphysema, unspecified: Secondary | ICD-10-CM | POA: Diagnosis not present

## 2017-01-16 DIAGNOSIS — I2510 Atherosclerotic heart disease of native coronary artery without angina pectoris: Secondary | ICD-10-CM | POA: Diagnosis not present

## 2017-01-16 DIAGNOSIS — Z7982 Long term (current) use of aspirin: Secondary | ICD-10-CM | POA: Diagnosis not present

## 2017-01-16 DIAGNOSIS — I10 Essential (primary) hypertension: Secondary | ICD-10-CM | POA: Diagnosis not present

## 2017-01-16 DIAGNOSIS — I700 Atherosclerosis of aorta: Secondary | ICD-10-CM | POA: Diagnosis not present

## 2017-01-18 DIAGNOSIS — I700 Atherosclerosis of aorta: Secondary | ICD-10-CM | POA: Diagnosis not present

## 2017-01-18 DIAGNOSIS — Z7982 Long term (current) use of aspirin: Secondary | ICD-10-CM | POA: Diagnosis not present

## 2017-01-18 DIAGNOSIS — I10 Essential (primary) hypertension: Secondary | ICD-10-CM | POA: Diagnosis not present

## 2017-01-18 DIAGNOSIS — J439 Emphysema, unspecified: Secondary | ICD-10-CM | POA: Diagnosis not present

## 2017-01-18 DIAGNOSIS — I2510 Atherosclerotic heart disease of native coronary artery without angina pectoris: Secondary | ICD-10-CM | POA: Diagnosis not present

## 2017-01-18 DIAGNOSIS — E1165 Type 2 diabetes mellitus with hyperglycemia: Secondary | ICD-10-CM | POA: Diagnosis not present

## 2017-01-22 DIAGNOSIS — E1165 Type 2 diabetes mellitus with hyperglycemia: Secondary | ICD-10-CM | POA: Diagnosis not present

## 2017-01-22 DIAGNOSIS — Z7982 Long term (current) use of aspirin: Secondary | ICD-10-CM | POA: Diagnosis not present

## 2017-01-22 DIAGNOSIS — I2510 Atherosclerotic heart disease of native coronary artery without angina pectoris: Secondary | ICD-10-CM | POA: Diagnosis not present

## 2017-01-22 DIAGNOSIS — I700 Atherosclerosis of aorta: Secondary | ICD-10-CM | POA: Diagnosis not present

## 2017-01-22 DIAGNOSIS — J439 Emphysema, unspecified: Secondary | ICD-10-CM | POA: Diagnosis not present

## 2017-01-22 DIAGNOSIS — I10 Essential (primary) hypertension: Secondary | ICD-10-CM | POA: Diagnosis not present

## 2017-01-23 DIAGNOSIS — J439 Emphysema, unspecified: Secondary | ICD-10-CM | POA: Diagnosis not present

## 2017-01-23 DIAGNOSIS — E1165 Type 2 diabetes mellitus with hyperglycemia: Secondary | ICD-10-CM | POA: Diagnosis not present

## 2017-01-23 DIAGNOSIS — I10 Essential (primary) hypertension: Secondary | ICD-10-CM | POA: Diagnosis not present

## 2017-01-23 DIAGNOSIS — I700 Atherosclerosis of aorta: Secondary | ICD-10-CM | POA: Diagnosis not present

## 2017-01-23 DIAGNOSIS — Z7982 Long term (current) use of aspirin: Secondary | ICD-10-CM | POA: Diagnosis not present

## 2017-01-23 DIAGNOSIS — I2510 Atherosclerotic heart disease of native coronary artery without angina pectoris: Secondary | ICD-10-CM | POA: Diagnosis not present

## 2017-01-24 DIAGNOSIS — Z7982 Long term (current) use of aspirin: Secondary | ICD-10-CM | POA: Diagnosis not present

## 2017-01-24 DIAGNOSIS — I700 Atherosclerosis of aorta: Secondary | ICD-10-CM | POA: Diagnosis not present

## 2017-01-24 DIAGNOSIS — E1165 Type 2 diabetes mellitus with hyperglycemia: Secondary | ICD-10-CM | POA: Diagnosis not present

## 2017-01-24 DIAGNOSIS — J439 Emphysema, unspecified: Secondary | ICD-10-CM | POA: Diagnosis not present

## 2017-01-24 DIAGNOSIS — I2510 Atherosclerotic heart disease of native coronary artery without angina pectoris: Secondary | ICD-10-CM | POA: Diagnosis not present

## 2017-01-24 DIAGNOSIS — I10 Essential (primary) hypertension: Secondary | ICD-10-CM | POA: Diagnosis not present

## 2017-01-26 DIAGNOSIS — J439 Emphysema, unspecified: Secondary | ICD-10-CM | POA: Diagnosis not present

## 2017-01-26 DIAGNOSIS — Z7982 Long term (current) use of aspirin: Secondary | ICD-10-CM | POA: Diagnosis not present

## 2017-01-26 DIAGNOSIS — I10 Essential (primary) hypertension: Secondary | ICD-10-CM | POA: Diagnosis not present

## 2017-01-26 DIAGNOSIS — I2510 Atherosclerotic heart disease of native coronary artery without angina pectoris: Secondary | ICD-10-CM | POA: Diagnosis not present

## 2017-01-26 DIAGNOSIS — I700 Atherosclerosis of aorta: Secondary | ICD-10-CM | POA: Diagnosis not present

## 2017-01-26 DIAGNOSIS — E1165 Type 2 diabetes mellitus with hyperglycemia: Secondary | ICD-10-CM | POA: Diagnosis not present

## 2017-01-30 DIAGNOSIS — Z7982 Long term (current) use of aspirin: Secondary | ICD-10-CM | POA: Diagnosis not present

## 2017-01-30 DIAGNOSIS — I700 Atherosclerosis of aorta: Secondary | ICD-10-CM | POA: Diagnosis not present

## 2017-01-30 DIAGNOSIS — I2510 Atherosclerotic heart disease of native coronary artery without angina pectoris: Secondary | ICD-10-CM | POA: Diagnosis not present

## 2017-01-30 DIAGNOSIS — E1165 Type 2 diabetes mellitus with hyperglycemia: Secondary | ICD-10-CM | POA: Diagnosis not present

## 2017-01-30 DIAGNOSIS — I10 Essential (primary) hypertension: Secondary | ICD-10-CM | POA: Diagnosis not present

## 2017-01-30 DIAGNOSIS — J439 Emphysema, unspecified: Secondary | ICD-10-CM | POA: Diagnosis not present

## 2017-01-31 ENCOUNTER — Ambulatory Visit: Payer: Medicare Other | Admitting: Family Medicine

## 2017-01-31 ENCOUNTER — Encounter: Payer: Self-pay | Admitting: Family Medicine

## 2017-01-31 VITALS — BP 124/62 | HR 82 | Ht 66.0 in | Wt 167.3 lb

## 2017-01-31 DIAGNOSIS — E119 Type 2 diabetes mellitus without complications: Secondary | ICD-10-CM

## 2017-01-31 DIAGNOSIS — E871 Hypo-osmolality and hyponatremia: Secondary | ICD-10-CM

## 2017-01-31 DIAGNOSIS — E1169 Type 2 diabetes mellitus with other specified complication: Secondary | ICD-10-CM | POA: Insufficient documentation

## 2017-01-31 DIAGNOSIS — E1121 Type 2 diabetes mellitus with diabetic nephropathy: Secondary | ICD-10-CM | POA: Insufficient documentation

## 2017-01-31 DIAGNOSIS — E782 Mixed hyperlipidemia: Secondary | ICD-10-CM

## 2017-01-31 DIAGNOSIS — Z955 Presence of coronary angioplasty implant and graft: Secondary | ICD-10-CM

## 2017-01-31 DIAGNOSIS — I10 Essential (primary) hypertension: Secondary | ICD-10-CM

## 2017-01-31 DIAGNOSIS — G459 Transient cerebral ischemic attack, unspecified: Secondary | ICD-10-CM

## 2017-01-31 DIAGNOSIS — I25119 Atherosclerotic heart disease of native coronary artery with unspecified angina pectoris: Secondary | ICD-10-CM

## 2017-01-31 DIAGNOSIS — F4323 Adjustment disorder with mixed anxiety and depressed mood: Secondary | ICD-10-CM

## 2017-01-31 DIAGNOSIS — E7800 Pure hypercholesterolemia, unspecified: Secondary | ICD-10-CM

## 2017-01-31 DIAGNOSIS — G9341 Metabolic encephalopathy: Secondary | ICD-10-CM

## 2017-01-31 MED ORDER — ESCITALOPRAM OXALATE 10 MG PO TABS: 10.0000 mg | ORAL_TABLET | Freq: Every day | ORAL | 0 refills | Status: DC

## 2017-01-31 NOTE — Patient Instructions (Signed)
-   Patient will come in tomorrow or the next day for fasting labs.  He understands it's important to get a BMP and check his sodium levels on him.  We will also check labs per orders.

## 2017-01-31 NOTE — Progress Notes (Signed)
New patient office visit note:  Impression and Recommendations:    1. Diabetes mellitus without complication ([place])   2. Essential hypertension   3. Pure hypercholesterolemia   4. Acute metabolic encephalopathy   5. Coronary artery disease involving native coronary artery of native heart with angina pectoris ([place])   6. Hyponatremia   7. Transient cerebral ischemia, unspecified type   8. S/P coronary artery stent placement   9. Adjustment disorder with mixed anxiety and depressed mood      No problem-specific Assessment & Plan notes found for this encounter.   The patient was counseled, risk factors were discussed, anticipatory guidance given.   New Prescriptions   ESCITALOPRAM (LEXAPRO) 10 MG TABLET    Take 1 tablet (10 mg total) by mouth daily.    Meds ordered this encounter  Medications  . escitalopram (LEXAPRO) 10 MG tablet    Sig: Take 1 tablet (10 mg total) by mouth daily.    Dispense:  90 tablet    Refill:  0    Discontinued Medications   No medications on file    Modified Medications   No medications on file    Orders Placed This Encounter  Procedures  . CBC with Differential/Platelet  . Comprehensive metabolic panel  . Hemoglobin A1c  . Lipid panel  . Phosphorus  . Magnesium  . Vitamin B12  . TSH  . T4, free  . VITAMIN D 25 Hydroxy (Vit-D Deficiency, Fractures)  . POCT UA - Microalbumin     Gross side effects, risk and benefits, and alternatives of medications discussed with patient.  Patient is aware that all medications have potential side effects and we are unable to predict every side effect or drug-drug interaction that may occur.  Expresses verbal understanding and consents to current therapy plan and treatment regimen.  No Follow-up on file.  Please see AVS handed out to patient at the end of our visit for further patient instructions/ counseling done pertaining to today's office visit.    Note: This document was prepared using  Dragon voice recognition software and may include unintentional dictation errors.  ----------------------------------------------------------------------------------------------------------------------    Subjective:    Chief complaint:   Chief Complaint  Patient presents with  . Establish Care     HPI: Seth Abbott is a pleasant 83 y.o. male who presents to [place] at [facility] today to review their medical history with me and establish care With me and also as a hospital follow-up.   I asked the patient to review their chronic problem list with me to ensure everything was updated and accurate.    All recent office visits with other providers, any medical records that patient brought in etc  - I reviewed today.     Also asked pt to get me medical records from [facility] providers/ specialists that they had seen within the past 3-5 years- if they are in private practice and/or do not work for a [employer], [facility], [facility], [place] or [employer] owned practice.  Told them to call their specialists to clarify this if they are not sure.   DM- pt sees Dr Buddy Duty- Endo.   Patient's blood sugars have been running in the 140s to 250s fasting.  He states that they have been very variable and up and down  Lost wife- 4 yrs ago.  Lives alone.    has been having episodes where he doesn't feel like doing much and very apathetic.  Patient  states she's been having some feelings of depression and lacking motivation to get up and even get out of bed sometimes.  No thoughts of suicide or hurting himself or hurting others.  Was on Xanax they believe for short time after his wife died but nothing since.  Prior doctor came to blow his feelings off until initially over his wife of 60+ years in 6 months or less.   Unmotivated and not finding pleasure in usual pleasurable things.  Son, Marya Amsler, lives next door. Fatima Sanger lives close as well.    Retired from [employer] 40+ yrs.   Patient recently seen in the  hospital admitted on 7\24\18 and discharged 7\27\18.  He presented to my clinic on 724 with progressive worsening of dyspnea on exertion of several weeks' duration and some intermittent chest pain as well as his son said he was confused.  At that time we sent him to the emergency room and he was admitted.  He was noted to have a low-grade temperature and have a positive d-dimer.  Workup was essentially negative with troponins CT head and CT chest but it did show some mild emphysema and spinal megaly.  Cardiology was consult that at that time.  Patient underwent a left heart cath on 7\26.  He has a history of prior coronary artery disease status post PC I of the RCA back in 8 of 2016.  He was medically managed with LAD disease.  At this time his left heart cath revealed progression and cardiology increase his Imdur from 15-30 mg daily.  They are following him closely.  Patient has had diabetes for at least 30 years.  He's been managed by endocrinology-[name] of [facility].  He's been on insulin 24+ years.  He takes NPH twice a day and Humalog twice daily with meals.  A1c's have always been well controlled up until about 1-2 years ago when it's been a little higher in the 7.5-8.5 range.  Hypertension: Ramapril was held due to low sodium on this last hospital admission.   He's been under fluid restriction.  He'll need a BMP.  Blood pressure has been Managed by cardiology.    Cholesterol: Patient is on a statin and managed by cardiology.  Last cholesterol panel was at goal with LDL less than 70 in October 2017.  Patient tries to stay busy with working around in his yard and garden.  Stable angina:  Patient is on 30 mg of Imdur daily.  He's had no dizziness, chest pain, shortness of breath, exercise intolerance, orthopnea or dyspnea on exertion more than usual.  He denies any swelling in his extremities or difficulty lying flat.  He denies any confusion or dizziness.  Acute confusion with recent  hospitalization: CT head was without acute abnormalities.  Patient has history of 2 TIAs in the remote past back in 1992 and a few years later.  Patient denies any history of confusion or sundowning.  He [place] alone in the [place] checks them periodically on him.     Problem  Diabetes Mellitus Without Complication ([facility])   Since 1980 or earlier- than 8 yrs later- was on insulin.    Typical A1c's around 6.5-7.0 for many yrs.  Recently - w/in past 1-2 yrs has been a little higher--> not over 8.0   - eye exam- q 87mo Dr [facility]  - Dr KBuddy Duty DM foot exams.   - no nephropathy retinopathy or neuropathy.   Htn (Hypertension)  Hyperlipidemia  S/P  Coronary Artery Stent Placement   02/09/15 --> Dr Roseanna Rainbow (Transient Ischemic Attack)   1992        Wt Readings from Last 3 Encounters:  01/31/17 167 lb 4.8 oz (75.9 kg)  01/05/17 173 lb (78.5 kg)  01/02/17 183 lb 14.4 oz (83.4 kg)   BP Readings from Last 3 Encounters:  01/31/17 124/62  01/05/17 (!) 123/50  01/02/17 110/68   Pulse Readings from Last 3 Encounters:  01/31/17 82  01/05/17 80  01/02/17 87   BMI Readings from Last 3 Encounters:  01/31/17 27.00 kg/m  01/05/17 29.70 kg/m  01/02/17 31.57 kg/m    Patient Care Team    Relationship Specialty Notifications Start End  Leeroy Cha, MD PCP - General Internal Medicine  01/01/17   Sueanne Margarita, MD PCP - Cardiology Cardiology  10/12/13    Comment: Merged    Patient Active Problem List   Diagnosis Date Noted  . Diabetes mellitus without complication ([place])     Priority: High  . HTN (hypertension)     Priority: High  . Hyperlipidemia     Priority: High  . Sepsis ([place]) 01/03/2017  . Acute metabolic encephalopathy 35/59/7416  . Hyponatremia 01/03/2017  . Bronchitis 01/03/2017  . S/P coronary artery stent placement   . SOB (shortness of breath) 08/16/2015  . Abnormal PFTs (pulmonary function tests) 08/16/2015  . CAD  (coronary artery disease), native coronary artery 02/09/2015  . Coronary artery calcification seen on CAT scan   . Aortic stenosis, mild   . Dilated aortic root ([place])   . TIA (transient ischemic attack)   . Prostatitis   . Shingles      Past Medical History:  Diagnosis Date  . Aortic stenosis, mild    a. By echo 12/2013.  [name] Arthritis    "mostly in my hands" (02/09/2015)  . CAD (coronary artery disease)    a. 12/2014 cath showed 95% prox RCA and diffuse calcified prox to distal LAD disease up to 70% at multiple spots. S/p rotational atherectomy/DES to prox RCA 02/09/15.  . Dilated aortic root ([place])   . GERD (gastroesophageal reflux disease)   . HTN (hypertension)   . Hyperlipidemia   . Prostatitis   . Shingles   . TIA (transient ischemic attack) early 1990's; 09/2006   [name] 10/26/2010  . Type II diabetes mellitus ([place])      Past Medical History:  Diagnosis Date  . Aortic stenosis, mild    a. By echo 12/2013.  [name] Arthritis    "mostly in my hands" (02/09/2015)  . CAD (coronary artery disease)    a. 12/2014 cath showed 95% prox RCA and diffuse calcified prox to distal LAD disease up to 70% at multiple spots. S/p rotational atherectomy/DES to prox RCA 02/09/15.  . Dilated aortic root ([place])   . GERD (gastroesophageal reflux disease)   . HTN (hypertension)   . Hyperlipidemia   . Prostatitis   . Shingles   . TIA (transient ischemic attack) early 1990's; 09/2006   [name] 10/26/2010  . Type II diabetes mellitus ([place])      Past Surgical History:  Procedure Laterality Date  . CARDIAC CATHETERIZATION N/A 12/11/2014   Procedure: Left Heart Cath and Coronary Angiography;  Surgeon: Belva Crome, MD;  Location: [facility];  Service: Cardiovascular;  Laterality: N/A;  . CARDIAC CATHETERIZATION N/A 02/09/2015   Procedure: Coronary Stent Intervention-Rotoblator, temp pacer;  Surgeon: Belva Crome, MD;  Location: [facility];  Service: Cardiovascular;  Laterality: N/A;  .  CATARACT EXTRACTION W/ INTRAOCULAR LENS  IMPLANT, BILATERAL Bilateral   . EXCISIONAL HEMORRHOIDECTOMY  1957  . EYE SURGERY    . LEFT HEART CATH AND CORONARY ANGIOGRAPHY N/A 01/04/2017   Procedure: Left Heart Cath and Coronary Angiography;  Surgeon: Belva Crome, MD;  Location: [facility];  Service: Cardiovascular;  Laterality: N/A;  . MEMBRANE PEEL Left 01/11/2015   eye     Family History  Problem Relation Age of Onset  . Diabetes Mother   . Heart attack Father   . Diabetes Father   . CAD Father   . Diabetes Sister   . COPD Brother   . Diabetes Sister      History  Drug Use No     History  Alcohol Use  . 0.0 oz/week    Comment: 02/09/2015 "might have a sip of wne a dozen times/yr"     History  Smoking Status  . Former Smoker  . Types: Cigarettes  Smokeless Tobacco  . Never Used    Comment: "haven't smoked since I was 15" tried it once     Outpatient Encounter Prescriptions as of 01/31/2017  Medication Sig  . ACCU-CHEK AVIVA PLUS test strip USE TO CHECK BLOOD SUGAR 3 TIMES DAILY  . aspirin 81 MG tablet Take 81 mg by mouth every morning.   [name] atorvastatin (LIPITOR) 20 MG tablet Take 20 mg by mouth every morning.   . clopidogrel (PLAVIX) 75 MG tablet Take 1 tablet (75 mg total) by mouth daily.  . insulin lispro (HUMALOG) 100 UNIT/ML cartridge Inject 10-15 Units into the skin 2 (two) times daily. Pt ranges his insulin with his blood sugar readings  . insulin NPH Human (HUMULIN N,NOVOLIN N) 100 UNIT/ML injection Inject 25 Units into the skin 2 (two) times daily before a meal.   . isosorbide mononitrate (IMDUR) 30 MG 24 hr tablet Take 1 tablet (30 mg total) by mouth daily.  . Melatonin 3 MG TABS Take 3 mg by mouth at bedtime as needed.  . metoprolol succinate (TOPROL-XL) 25 MG 24 hr tablet TAKE 1 TABLET (25 MG TOTAL) BY MOUTH DAILY. TAKE WITH OR IMMEDIATELY FOLLOWING A MEAL.  . Multiple Vitamins-Minerals (MULTIVITAMIN WITH MINERALS) tablet Take 1 tablet by mouth  daily.  . nitroGLYCERIN (NITROSTAT) 0.4 MG SL tablet Place 1 tablet (0.4 mg total) under the tongue every 5 (five) minutes as needed for chest pain (up to 3 doses).  . pantoprazole (PROTONIX) 20 MG tablet TAKE 1 TABLET BY MOUTH EVERY DAY  . escitalopram (LEXAPRO) 10 MG tablet Take 1 tablet (10 mg total) by mouth daily.   No facility-administered encounter medications on file as of 01/31/2017.     Allergies: Patient has no known allergies.   ROS   Objective:   Blood pressure 124/62, pulse 82, height '5\' 6"'  (1.676 m), weight 167 lb 4.8 oz (75.9 kg). Body mass index is 27 kg/m. General: Well Developed, well nourished, and in no acute distress.  Neuro: Alert and oriented x3, extra-ocular muscles intact, sensation grossly intact.  HEENT:[place]/AT, PERRLA, neck supple, No carotid bruits Skin: no gross rashes  Cardiac: Regular rate and rhythm Respiratory: Essentially clear to auscultation bilaterally. Not using accessory muscles, speaking in full sentences.  Abdominal: not grossly distended Musculoskeletal: Ambulates w/o diff, FROM * 4 ext.  Vasc: less 2 sec cap RF, warm and pink  Psych:  No HI/SI, judgement and insight good, Euthymic mood. Full Affect.  Recent Results (from the past 2160 hour(s))  POCT Glucose (CBG)     Status: Abnormal   Collection Time: 01/02/17  4:28 PM  Result Value Ref Range   POC Glucose 316 (A) 70 - 99 mg/dl  CBG monitoring, ED     Status: Abnormal   Collection Time: 01/02/17  7:19 PM  Result Value Ref Range   Glucose-Capillary 252 (H) 65 - 99 mg/dL  Comprehensive metabolic panel     Status: Abnormal   Collection Time: 01/02/17  7:32 PM  Result Value Ref Range   Sodium 128 (L) 135 - 145 mmol/L   Potassium 4.3 3.5 - 5.1 mmol/L   Chloride 97 (L) 101 - 111 mmol/L   CO2 22 22 - 32 mmol/L   Glucose, Bld 251 (H) 65 - 99 mg/dL   BUN 21 (H) 6 - 20 mg/dL   Creatinine, Ser 1.14 0.61 - 1.24 mg/dL   Calcium 8.5 (L) 8.9 - 10.3 mg/dL   Total Protein 5.8 (L) 6.5 -  8.1 g/dL   Albumin 3.0 (L) 3.5 - 5.0 g/dL   AST 29 15 - 41 U/L   ALT 31 17 - 63 U/L   Alkaline Phosphatase 101 38 - 126 U/L   Total Bilirubin 1.3 (H) 0.3 - 1.2 mg/dL   GFR calc non Af Amer 56 (L) >60 mL/min   GFR calc Af Amer >60 >60 mL/min    Comment: (NOTE) The eGFR has been calculated using the CKD EPI equation. This calculation has not been validated in all clinical situations. eGFR's persistently <60 mL/min signify possible Chronic Kidney Disease.    Anion gap 9 5 - 15  CBC with Differential     Status: Abnormal   Collection Time: 01/02/17  7:32 PM  Result Value Ref Range   WBC 4.8 4.0 - 10.5 K/uL   RBC 4.15 (L) 4.22 - 5.81 MIL/uL   Hemoglobin 12.1 (L) 13.0 - 17.0 g/dL   HCT 34.8 (L) 39.0 - 52.0 %   MCV 83.9 78.0 - 100.0 fL   MCH 29.2 26.0 - 34.0 pg   MCHC 34.8 30.0 - 36.0 g/dL   RDW 15.7 (H) 11.5 - 15.5 %   Platelets 122 (L) 150 - 400 K/uL   Neutrophils Relative % 31 %   Neutro Abs 1.5 (L) 1.7 - 7.7 K/uL   Lymphocytes Relative 56 %   Lymphs Abs 2.6 0.7 - 4.0 K/uL   Monocytes Relative 12 %   Monocytes Absolute 0.6 0.1 - 1.0 K/uL   Eosinophils Relative 0 %   Eosinophils Absolute 0.0 0.0 - 0.7 K/uL   Basophils Relative 1 %   Basophils Absolute 0.1 0.0 - 0.1 K/uL  D-dimer, quantitative (not at [facility])     Status: Abnormal   Collection Time: 01/02/17  7:32 PM  Result Value Ref Range   D-Dimer, Quant 2.11 (H) 0.00 - 0.50 ug/mL-FEU    Comment: (NOTE) At the manufacturer cut-off of 0.50 ug/mL FEU, this assay has been documented to exclude PE with a sensitivity and negative predictive value of 97 to 99%.  At this time, this assay has not been approved by the FDA to exclude DVT/VTE. Results should be correlated with clinical presentation.   Troponin I     Status: None   Collection Time: 01/02/17  7:32 PM  Result Value Ref Range   Troponin I <0.03 <0.03 ng/mL  Brain natriuretic peptide     Status: None   Collection Time: 01/02/17  7:32 PM  Result Value Ref Range   B  Natriuretic Peptide 19.8 0.0 - 100.0 pg/mL  Urinalysis, Routine w reflex microscopic     Status: Abnormal   Collection Time: 01/02/17  7:40 PM  Result Value Ref Range   Color, Urine YELLOW YELLOW   APPearance CLEAR CLEAR   Specific Gravity, Urine 1.023 1.005 - 1.030   pH 6.0 5.0 - 8.0   Glucose, UA >=500 (A) NEGATIVE mg/dL   Hgb urine dipstick SMALL (A) NEGATIVE   Bilirubin Urine NEGATIVE NEGATIVE   Ketones, ur 5 (A) NEGATIVE mg/dL   Protein, ur 100 (A) NEGATIVE mg/dL   Nitrite NEGATIVE NEGATIVE   Leukocytes, UA NEGATIVE NEGATIVE   RBC / HPF 0-5 0 - 5 RBC/hpf   WBC, UA 0-5 0 - 5 WBC/hpf   Bacteria, UA RARE (A) NONE SEEN   Squamous Epithelial / LPF NONE SEEN NONE SEEN   Mucus PRESENT   Sodium, urine, random     Status: None   Collection Time: 01/02/17  7:40 PM  Result Value Ref Range   Sodium, Ur 66 mmol/L  Strep pneumoniae urinary antigen     Status: None   Collection Time: 01/02/17  7:40 PM  Result Value Ref Range   Strep Pneumo Urinary Antigen NEGATIVE NEGATIVE    Comment:        Infection due to S. pneumoniae cannot be absolutely ruled out since the antigen present may be below the detection limit of the test.   Osmolality, urine     Status: None   Collection Time: 01/02/17  7:40 PM  Result Value Ref Range   Osmolality, Ur 712 300 - 900 mOsm/kg  I-Stat CG4 Lactic Acid, ED     Status: None   Collection Time: 01/02/17  7:51 PM  Result Value Ref Range   Lactic Acid, Venous 1.76 0.5 - 1.9 mmol/L  Osmolality     Status: None   Collection Time: 01/03/17  1:26 AM  Result Value Ref Range   Osmolality 277 275 - 295 mOsm/kg  Lactic acid, plasma     Status: None   Collection Time: 01/03/17  1:26 AM  Result Value Ref Range   Lactic Acid, Venous 0.9 0.5 - 1.9 mmol/L  Procalcitonin     Status: None   Collection Time: 01/03/17  1:26 AM  Result Value Ref Range   Procalcitonin 0.22 ng/mL    Comment:        Interpretation: PCT (Procalcitonin) <= 0.5 ng/mL: Systemic  infection (sepsis) is not likely. Local bacterial infection is possible. (NOTE)         ICU PCT Algorithm               Non ICU PCT Algorithm    ----------------------------     ------------------------------         PCT < 0.25 ng/mL                 PCT < 0.1 ng/mL     Stopping of antibiotics            Stopping of antibiotics       strongly encouraged.               strongly encouraged.    ----------------------------     ------------------------------       PCT level decrease by               PCT < 0.25 ng/mL       >= 80% from peak  PCT       OR PCT 0.25 - 0.5 ng/mL          Stopping of antibiotics                                             encouraged.     Stopping of antibiotics           encouraged.    ----------------------------     ------------------------------       PCT level decrease by              PCT >= 0.25 ng/mL       < 80% from peak PCT        AND PCT >= 0.5 ng/mL            Continuin g antibiotics                                              encouraged.       Continuing antibiotics            encouraged.    ----------------------------     ------------------------------     PCT level increase compared          PCT > 0.5 ng/mL         with peak PCT AND          PCT >= 0.5 ng/mL             Escalation of antibiotics                                          strongly encouraged.      Escalation of antibiotics        strongly encouraged.   Culture, blood (routine x 2) Call MD if unable to obtain prior to antibiotics being given     Status: None   Collection Time: 01/03/17  2:10 AM  Result Value Ref Range   Specimen Description BLOOD RIGHT WRIST    Special Requests      BOTTLES DRAWN AEROBIC AND ANAEROBIC Blood Culture adequate volume   Culture NO GROWTH 5 DAYS    Report Status 01/08/2017 FINAL   TSH     Status: None   Collection Time: 01/03/17  2:27 AM  Result Value Ref Range   TSH 4.175 0.350 - 4.500 uIU/mL    Comment: Performed by a 3rd Generation assay with a  functional sensitivity of <=0.01 uIU/mL.  Respiratory Panel by PCR     Status: None   Collection Time: 01/03/17  2:27 AM  Result Value Ref Range   Adenovirus NOT DETECTED NOT DETECTED   Coronavirus 229E NOT DETECTED NOT DETECTED   Coronavirus HKU1 NOT DETECTED NOT DETECTED   Coronavirus NL63 NOT DETECTED NOT DETECTED   Coronavirus OC43 NOT DETECTED NOT DETECTED   Metapneumovirus NOT DETECTED NOT DETECTED   Rhinovirus / Enterovirus NOT DETECTED NOT DETECTED   Influenza A NOT DETECTED NOT DETECTED   Influenza B NOT DETECTED NOT DETECTED   Parainfluenza Virus 1 NOT DETECTED NOT DETECTED   Parainfluenza Virus 2 NOT DETECTED NOT DETECTED   Parainfluenza Virus 3  NOT DETECTED NOT DETECTED   Parainfluenza Virus 4 NOT DETECTED NOT DETECTED   Respiratory Syncytial Virus NOT DETECTED NOT DETECTED   Bordetella pertussis NOT DETECTED NOT DETECTED   Chlamydophila pneumoniae NOT DETECTED NOT DETECTED   Mycoplasma pneumoniae NOT DETECTED NOT DETECTED  Culture, blood (routine x 2) Call MD if unable to obtain prior to antibiotics being given     Status: None   Collection Time: 01/03/17  2:27 AM  Result Value Ref Range   Specimen Description BLOOD LEFT WRIST    Special Requests      BOTTLES DRAWN AEROBIC AND ANAEROBIC Blood Culture results may not be optimal due to an inadequate volume of blood received in culture bottles   Culture NO GROWTH 5 DAYS    Report Status 01/08/2017 FINAL   Glucose, capillary     Status: Abnormal   Collection Time: 01/03/17  2:48 AM  Result Value Ref Range   Glucose-Capillary 184 (H) 65 - 99 mg/dL   Comment 1 Notify RN    Comment 2 Document in Chart   Ammonia     Status: None   Collection Time: 01/03/17  5:54 AM  Result Value Ref Range   Ammonia 17 9 - 35 umol/L  CBC     Status: Abnormal   Collection Time: 01/03/17  5:54 AM  Result Value Ref Range   WBC 2.9 (L) 4.0 - 10.5 K/uL   RBC 4.03 (L) 4.22 - 5.81 MIL/uL   Hemoglobin 11.8 (L) 13.0 - 17.0 g/dL   HCT 34.4 (L)  39.0 - 52.0 %   MCV 85.4 78.0 - 100.0 fL   MCH 29.3 26.0 - 34.0 pg   MCHC 34.3 30.0 - 36.0 g/dL   RDW 15.7 (H) 11.5 - 15.5 %   Platelets 109 (L) 150 - 400 K/uL    Comment: PLATELET COUNT CONFIRMED BY SMEAR  Glucose, capillary     Status: Abnormal   Collection Time: 01/03/17  7:54 AM  Result Value Ref Range   Glucose-Capillary 226 (H) 65 - 99 mg/dL   Comment 1 Notify RN    Comment 2 Document in Chart   Glucose, capillary     Status: Abnormal   Collection Time: 01/03/17 11:49 AM  Result Value Ref Range   Glucose-Capillary 257 (H) 65 - 99 mg/dL   Comment 1 Notify RN    Comment 2 Document in Chart   Glucose, capillary     Status: Abnormal   Collection Time: 01/03/17  4:01 PM  Result Value Ref Range   Glucose-Capillary 320 (H) 65 - 99 mg/dL   Comment 1 Notify RN    Comment 2 Document in Chart   ECHOCARDIOGRAM COMPLETE     Status: None   Collection Time: 01/03/17  5:07 PM  Result Value Ref Range   Weight 2,792 oz   Height 64 in   BP 129/48 mmHg  Glucose, capillary     Status: Abnormal   Collection Time: 01/03/17  7:40 PM  Result Value Ref Range   Glucose-Capillary 258 (H) 65 - 99 mg/dL   Comment 1 Notify RN    Comment 2 Document in Chart   Glucose, capillary     Status: Abnormal   Collection Time: 01/03/17 10:10 PM  Result Value Ref Range   Glucose-Capillary 183 (H) 65 - 99 mg/dL  Glucose, capillary     Status: Abnormal   Collection Time: 01/04/17 12:12 AM  Result Value Ref Range   Glucose-Capillary 158 (H) 65 - 99  mg/dL  Basic metabolic panel     Status: Abnormal   Collection Time: 01/04/17  4:22 AM  Result Value Ref Range   Sodium 128 (L) 135 - 145 mmol/L   Potassium 4.2 3.5 - 5.1 mmol/L   Chloride 100 (L) 101 - 111 mmol/L   CO2 22 22 - 32 mmol/L   Glucose, Bld 140 (H) 65 - 99 mg/dL   BUN 13 6 - 20 mg/dL   Creatinine, Ser 1.00 0.61 - 1.24 mg/dL   Calcium 7.9 (L) 8.9 - 10.3 mg/dL   GFR calc non Af Amer >60 >60 mL/min   GFR calc Af Amer >60 >60 mL/min    Comment:  (NOTE) The eGFR has been calculated using the CKD EPI equation. This calculation has not been validated in all clinical situations. eGFR's persistently <60 mL/min signify possible Chronic Kidney Disease.    Anion gap 6 5 - 15  Osmolality     Status: None   Collection Time: 01/04/17  7:10 AM  Result Value Ref Range   Osmolality 275 275 - 295 mOsm/kg  Glucose, capillary     Status: Abnormal   Collection Time: 01/04/17  7:46 AM  Result Value Ref Range   Glucose-Capillary 170 (H) 65 - 99 mg/dL  CBC     Status: Abnormal   Collection Time: 01/04/17 10:31 AM  Result Value Ref Range   WBC 4.2 4.0 - 10.5 K/uL   RBC 4.13 (L) 4.22 - 5.81 MIL/uL   Hemoglobin 11.8 (L) 13.0 - 17.0 g/dL   HCT 35.2 (L) 39.0 - 52.0 %   MCV 85.2 78.0 - 100.0 fL   MCH 28.6 26.0 - 34.0 pg   MCHC 33.5 30.0 - 36.0 g/dL   RDW 15.9 (H) 11.5 - 15.5 %   Platelets 124 (L) 150 - 400 K/uL  Protime-INR     Status: Abnormal   Collection Time: 01/04/17 10:31 AM  Result Value Ref Range   Prothrombin Time 15.3 (H) 11.4 - 15.2 seconds   INR 1.20   Glucose, capillary     Status: Abnormal   Collection Time: 01/04/17 11:08 AM  Result Value Ref Range   Glucose-Capillary 151 (H) 65 - 99 mg/dL  Glucose, capillary     Status: Abnormal   Collection Time: 01/04/17  4:39 PM  Result Value Ref Range   Glucose-Capillary 119 (H) 65 - 99 mg/dL  Glucose, capillary     Status: Abnormal   Collection Time: 01/04/17  8:16 PM  Result Value Ref Range   Glucose-Capillary 128 (H) 65 - 99 mg/dL  Glucose, capillary     Status: Abnormal   Collection Time: 01/04/17  9:08 PM  Result Value Ref Range   Glucose-Capillary 214 (H) 65 - 99 mg/dL   Comment 1 Notify RN    Comment 2 Document in Chart   Glucose, capillary     Status: Abnormal   Collection Time: 01/04/17 10:49 PM  Result Value Ref Range   Glucose-Capillary 296 (H) 65 - 99 mg/dL  Glucose, capillary     Status: Abnormal   Collection Time: 01/05/17 12:12 AM  Result Value Ref Range    Glucose-Capillary 320 (H) 65 - 99 mg/dL  Glucose, capillary     Status: Abnormal   Collection Time: 01/05/17  1:43 AM  Result Value Ref Range   Glucose-Capillary 260 (H) 65 - 99 mg/dL  Glucose, capillary     Status: Abnormal   Collection Time: 01/05/17  2:31 AM  Result Value Ref  Range   Glucose-Capillary 239 (H) 65 - 99 mg/dL  CBC     Status: Abnormal   Collection Time: 01/05/17  3:17 AM  Result Value Ref Range   WBC 4.9 4.0 - 10.5 K/uL   RBC 4.07 (L) 4.22 - 5.81 MIL/uL   Hemoglobin 11.6 (L) 13.0 - 17.0 g/dL   HCT 34.4 (L) 39.0 - 52.0 %   MCV 84.5 78.0 - 100.0 fL   MCH 28.5 26.0 - 34.0 pg   MCHC 33.7 30.0 - 36.0 g/dL   RDW 15.8 (H) 11.5 - 15.5 %   Platelets 147 (L) 150 - 400 K/uL  Basic metabolic panel     Status: Abnormal   Collection Time: 01/05/17  3:17 AM  Result Value Ref Range   Sodium 127 (L) 135 - 145 mmol/L   Potassium 4.2 3.5 - 5.1 mmol/L   Chloride 99 (L) 101 - 111 mmol/L   CO2 23 22 - 32 mmol/L   Glucose, Bld 211 (H) 65 - 99 mg/dL   BUN 11 6 - 20 mg/dL   Creatinine, Ser 1.04 0.61 - 1.24 mg/dL   Calcium 7.9 (L) 8.9 - 10.3 mg/dL   GFR calc non Af Amer >60 >60 mL/min   GFR calc Af Amer >60 >60 mL/min    Comment: (NOTE) The eGFR has been calculated using the CKD EPI equation. This calculation has not been validated in all clinical situations. eGFR's persistently <60 mL/min signify possible Chronic Kidney Disease.    Anion gap 5 5 - 15  Glucose, capillary     Status: Abnormal   Collection Time: 01/05/17  7:43 AM  Result Value Ref Range   Glucose-Capillary 207 (H) 65 - 99 mg/dL   Comment 1 Notify RN    Comment 2 Document in Chart   Glucose, capillary     Status: Abnormal   Collection Time: 01/05/17 11:43 AM  Result Value Ref Range   Glucose-Capillary 294 (H) 65 - 99 mg/dL   Comment 1 Notify RN    Comment 2 Document in Chart

## 2017-02-01 LAB — POCT UA - MICROALBUMIN
Creatinine, POC: 200 mg/dL
MICROALBUMIN (UR) POC: 80 mg/L

## 2017-02-02 ENCOUNTER — Other Ambulatory Visit: Payer: Medicare Other

## 2017-02-02 DIAGNOSIS — E119 Type 2 diabetes mellitus without complications: Secondary | ICD-10-CM

## 2017-02-03 LAB — CBC WITH DIFFERENTIAL/PLATELET
BASOS: 1 %
Basophils Absolute: 0 10*3/uL (ref 0.0–0.2)
EOS (ABSOLUTE): 0.1 10*3/uL (ref 0.0–0.4)
Eos: 3 %
HEMATOCRIT: 38.3 % (ref 37.5–51.0)
Hemoglobin: 12.9 g/dL — ABNORMAL LOW (ref 13.0–17.7)
IMMATURE GRANULOCYTES: 0 %
Immature Grans (Abs): 0 10*3/uL (ref 0.0–0.1)
LYMPHS ABS: 1.4 10*3/uL (ref 0.7–3.1)
Lymphs: 36 %
MCH: 28.9 pg (ref 26.6–33.0)
MCHC: 33.7 g/dL (ref 31.5–35.7)
MCV: 86 fL (ref 79–97)
MONOS ABS: 0.4 10*3/uL (ref 0.1–0.9)
Monocytes: 9 %
NEUTROS PCT: 51 %
Neutrophils Absolute: 1.9 10*3/uL (ref 1.4–7.0)
PLATELETS: 215 10*3/uL (ref 150–379)
RBC: 4.47 x10E6/uL (ref 4.14–5.80)
RDW: 15.6 % — AB (ref 12.3–15.4)
WBC: 3.8 10*3/uL (ref 3.4–10.8)

## 2017-02-03 LAB — COMPREHENSIVE METABOLIC PANEL
A/G RATIO: 1.5 (ref 1.2–2.2)
ALK PHOS: 102 IU/L (ref 39–117)
ALT: 15 IU/L (ref 0–44)
AST: 18 IU/L (ref 0–40)
Albumin: 4 g/dL (ref 3.5–4.7)
BILIRUBIN TOTAL: 0.5 mg/dL (ref 0.0–1.2)
BUN/Creatinine Ratio: 13 (ref 10–24)
BUN: 11 mg/dL (ref 8–27)
CALCIUM: 9 mg/dL (ref 8.6–10.2)
CHLORIDE: 100 mmol/L (ref 96–106)
CO2: 24 mmol/L (ref 20–29)
Creatinine, Ser: 0.84 mg/dL (ref 0.76–1.27)
GFR calc Af Amer: 92 mL/min/{1.73_m2} (ref >59)
GFR, EST NON AFRICAN AMERICAN: 79 mL/min/{1.73_m2} (ref >59)
GLOBULIN, TOTAL: 2.7 g/dL (ref 1.5–4.5)
Glucose: 183 mg/dL — ABNORMAL HIGH (ref 65–99)
POTASSIUM: 4.6 mmol/L (ref 3.5–5.2)
SODIUM: 138 mmol/L (ref 134–144)
Total Protein: 6.7 g/dL (ref 6.0–8.5)

## 2017-02-03 LAB — VITAMIN D 25 HYDROXY (VIT D DEFICIENCY, FRACTURES): VIT D 25 HYDROXY: 34.4 ng/mL (ref 30.0–100.0)

## 2017-02-03 LAB — LIPID PANEL
CHOLESTEROL TOTAL: 122 mg/dL (ref 100–199)
Chol/HDL Ratio: 2.6 ratio (ref 0.0–5.0)
HDL: 47 mg/dL (ref >39)
LDL Calculated: 60 mg/dL (ref 0–99)
Triglycerides: 74 mg/dL (ref 0–149)
VLDL CHOLESTEROL CAL: 15 mg/dL (ref 5–40)

## 2017-02-03 LAB — PHOSPHORUS: PHOSPHORUS: 3.2 mg/dL (ref 2.5–4.5)

## 2017-02-03 LAB — VITAMIN B12: VITAMIN B 12: 1297 pg/mL — AB (ref 232–1245)

## 2017-02-03 LAB — TSH: TSH: 2.94 u[IU]/mL (ref 0.450–4.500)

## 2017-02-03 LAB — T4, FREE: Free T4: 1.2 ng/dL (ref 0.82–1.77)

## 2017-02-03 LAB — HEMOGLOBIN A1C
ESTIMATED AVERAGE GLUCOSE: 203 mg/dL
HEMOGLOBIN A1C: 8.7 % — AB (ref 4.8–5.6)

## 2017-02-03 LAB — MAGNESIUM: MAGNESIUM: 1.9 mg/dL (ref 1.6–2.3)

## 2017-02-04 ENCOUNTER — Encounter: Payer: Self-pay | Admitting: Family Medicine

## 2017-02-04 DIAGNOSIS — M1990 Unspecified osteoarthritis, unspecified site: Secondary | ICD-10-CM | POA: Insufficient documentation

## 2017-02-06 DIAGNOSIS — I2510 Atherosclerotic heart disease of native coronary artery without angina pectoris: Secondary | ICD-10-CM | POA: Diagnosis not present

## 2017-02-06 DIAGNOSIS — I700 Atherosclerosis of aorta: Secondary | ICD-10-CM | POA: Diagnosis not present

## 2017-02-06 DIAGNOSIS — E1165 Type 2 diabetes mellitus with hyperglycemia: Secondary | ICD-10-CM | POA: Diagnosis not present

## 2017-02-06 DIAGNOSIS — I10 Essential (primary) hypertension: Secondary | ICD-10-CM | POA: Diagnosis not present

## 2017-02-06 DIAGNOSIS — Z7982 Long term (current) use of aspirin: Secondary | ICD-10-CM | POA: Diagnosis not present

## 2017-02-06 DIAGNOSIS — J439 Emphysema, unspecified: Secondary | ICD-10-CM | POA: Diagnosis not present

## 2017-02-08 ENCOUNTER — Ambulatory Visit (INDEPENDENT_AMBULATORY_CARE_PROVIDER_SITE_OTHER): Payer: Medicare Other | Admitting: Family Medicine

## 2017-02-08 ENCOUNTER — Encounter: Payer: Self-pay | Admitting: Family Medicine

## 2017-02-08 VITALS — BP 127/61 | HR 78 | Ht 66.0 in | Wt 167.0 lb

## 2017-02-08 DIAGNOSIS — E1121 Type 2 diabetes mellitus with diabetic nephropathy: Secondary | ICD-10-CM

## 2017-02-08 DIAGNOSIS — I5030 Unspecified diastolic (congestive) heart failure: Secondary | ICD-10-CM

## 2017-02-08 DIAGNOSIS — Z7982 Long term (current) use of aspirin: Secondary | ICD-10-CM | POA: Diagnosis not present

## 2017-02-08 DIAGNOSIS — Z794 Long term (current) use of insulin: Secondary | ICD-10-CM

## 2017-02-08 DIAGNOSIS — Z955 Presence of coronary angioplasty implant and graft: Secondary | ICD-10-CM

## 2017-02-08 DIAGNOSIS — I2510 Atherosclerotic heart disease of native coronary artery without angina pectoris: Secondary | ICD-10-CM | POA: Diagnosis not present

## 2017-02-08 DIAGNOSIS — E1159 Type 2 diabetes mellitus with other circulatory complications: Secondary | ICD-10-CM

## 2017-02-08 DIAGNOSIS — F4323 Adjustment disorder with mixed anxiety and depressed mood: Secondary | ICD-10-CM

## 2017-02-08 DIAGNOSIS — R809 Proteinuria, unspecified: Secondary | ICD-10-CM

## 2017-02-08 DIAGNOSIS — I10 Essential (primary) hypertension: Secondary | ICD-10-CM

## 2017-02-08 DIAGNOSIS — E1169 Type 2 diabetes mellitus with other specified complication: Secondary | ICD-10-CM

## 2017-02-08 DIAGNOSIS — E559 Vitamin D deficiency, unspecified: Secondary | ICD-10-CM

## 2017-02-08 DIAGNOSIS — I700 Atherosclerosis of aorta: Secondary | ICD-10-CM | POA: Diagnosis not present

## 2017-02-08 DIAGNOSIS — E1165 Type 2 diabetes mellitus with hyperglycemia: Secondary | ICD-10-CM | POA: Diagnosis not present

## 2017-02-08 DIAGNOSIS — E871 Hypo-osmolality and hyponatremia: Secondary | ICD-10-CM

## 2017-02-08 DIAGNOSIS — E782 Mixed hyperlipidemia: Principal | ICD-10-CM

## 2017-02-08 DIAGNOSIS — E1129 Type 2 diabetes mellitus with other diabetic kidney complication: Secondary | ICD-10-CM

## 2017-02-08 DIAGNOSIS — J439 Emphysema, unspecified: Secondary | ICD-10-CM | POA: Diagnosis not present

## 2017-02-08 MED ORDER — VITAMIN D3 125 MCG (5000 UT) PO TABS: ORAL_TABLET | ORAL | 3 refills | Status: DC

## 2017-02-08 NOTE — Progress Notes (Signed)
Assessment and plan:  1. Combined hyperlipidemia associated with type 2 diabetes mellitus ([place])   2. Diabetic nephropathy with proteinuria ([place])   3. Type 2 diabetes mellitus with microalbuminuria, with long-term current use of insulin ([place])   4. Hypertension associated with diabetes ([place])   5. grade I Diastolic heart failure secondary to coronary artery disease ([place])   6. S/P coronary artery stent placement   7. Hyponatremia   8. Vitamin D insufficiency   9. Adjustment disorder with mixed anxiety and depressed mood     No problem-specific Assessment & Plan notes found for this encounter.    Meds ordered this encounter  Medications  . Cholecalciferol (VITAMIN D3) 5000 units TABS    Sig: 5,000 IU OTC vitamin D3 daily.    Dispense:  90 tablet    Refill:  3    Return for diabetes and blood pressure follow up in 2.5-3 months- .  Anticipatory guidance and routine counseling done re: condition, txmnt options and need for follow up. All questions of patient's were answered.   Gross side effects, risk and benefits, and alternatives of medications discussed with patient.  Patient is aware that all medications have potential side effects and we are unable to predict every sideeffect or drug-drug interaction that may occur.  Expresses verbal understanding and consents to current therapy plan and treatment regiment.  Please see AVS handed out to patient at the end of our visit for additional patient instructions/ counseling done pertaining to today's office visit.  Note: This document was prepared using Dragon voice recognition software and may include unintentional dictation errors.   ----------------------------------------------------------------------------------------------------------------------  Subjective:   CC:   Seth Abbott is a 83 y.o. male who presents to [place] at [facility] today  for review and discussion of recent bloodwork that was done.  1. All recent blood work that we ordered was reviewed with patient today.  Patient was counseled on all abnormalities and we discussed dietary and lifestyle changes that could help those values (also medications when appropriate).  Extensive health counseling performed and all patient's concerns/ questions were addressed.  2. This diabetes: His blood sugars at home have been running much better than prior.  They're running around 100 to about 125 on average fasting.      One time he did go 54 and he felt a little shaky and sweaty.  That was around 4:00 in the morning. 3. Mood-  Started lexapro last office visit as his son felt he might be sad and an little depressed since living alone etc.  He's tolerated it well without side effect.  He doesn't feel much different yet but it's only been a couple weeks.  He is going to continue that medicine    Wt Readings from Last 3 Encounters:  04/25/17 174 lb (78.9 kg)  02/08/17 167 lb (75.8 kg)  01/31/17 167 lb 4.8 oz (75.9 kg)   BP Readings from Last 3 Encounters:  04/25/17 140/78  02/08/17 127/61  01/31/17 124/62   Pulse Readings from Last 3 Encounters:  04/25/17 82  02/08/17 78  01/31/17 82   BMI Readings from Last 3 Encounters:  04/25/17 28.08 kg/m  02/08/17 26.95 kg/m  01/31/17 27.00 kg/m     Patient Care Team    Relationship Specialty Notifications Start End  [name], DO PCP - General Family Medicine  01/31/17   Sueanne Margarita, MD PCP - Cardiology Cardiology  10/12/13  Comment: Santa Lighter, MD Consulting Physician Endocrinology  01/31/17     Full medical history updated and reviewed in the office today  Patient Active Problem List   Diagnosis Date Noted  . Diabetic nephropathy with proteinuria ([place]) 01/31/2017    Priority: High  . Combined hyperlipidemia associated with type 2 diabetes mellitus ([place]) 01/31/2017    Priority: High  . Type II  Diabetes mellitus with microalbuminuria, with long-term current use of insulin ([facility])     Priority: High  . Hypertension associated with diabetes ([place])     Priority: High  . S/P coronary artery stent placement     Priority: Medium  . grade I Diastolic heart failure secondary to coronary artery disease ([place]) 02/09/2015    Priority: Medium  . h/o TIA due to embolism ([place])- 10-15 yrs ago     Priority: Medium  . Vitamin D insufficiency 02/08/2017  . Adjustment disorder with mixed anxiety and depressed mood 02/08/2017  . Arthritis 02/04/2017  . Sepsis ([place]) 01/03/2017  . Acute metabolic encephalopathy 93/26/7124  . Hyponatremia 01/03/2017  . Bronchitis 01/03/2017  . SOB (shortness of breath) 08/16/2015  . Abnormal PFTs (pulmonary function tests) 08/16/2015  . Coronary artery calcification seen on CAT scan   . Aortic stenosis, mild   . Dilated aortic root ([place])   . Prostatitis   . Shingles     Past Medical History:  Diagnosis Date  . Aortic stenosis, mild    a. By echo 12/2013.  [name] Arthritis    "mostly in my hands" (02/09/2015)  . CAD (coronary artery disease)    a. 12/2014 cath showed 95% prox RCA and diffuse calcified prox to distal LAD disease up to 70% at multiple spots. S/p rotational atherectomy/DES to prox RCA 02/09/15.  . Dilated aortic root ([place])   . GERD (gastroesophageal reflux disease)   . HTN (hypertension)   . Hyperlipidemia   . Prostatitis   . Shingles   . TIA (transient ischemic attack) early 1990's; 09/2006   [name] 10/26/2010  . Type II diabetes mellitus ([place])     Past Surgical History:  Procedure Laterality Date  . CATARACT EXTRACTION W/ INTRAOCULAR LENS  IMPLANT, BILATERAL Bilateral   . EXCISIONAL HEMORRHOIDECTOMY  1957  . EYE SURGERY    . MEMBRANE PEEL Left 01/11/2015   eye    Social History   Tobacco Use  . Smoking status: Former Smoker    Types: Cigarettes  . Smokeless tobacco: Never Used  . Tobacco comment: "haven't smoked since I was 15" tried it  once  Substance Use Topics  . Alcohol use: Yes    Alcohol/week: 0.0 oz    Comment: 02/09/2015 "might have a sip of wne a dozen times/yr"    Family Hx: Family History  Problem Relation Age of Onset  . Diabetes Mother   . Heart attack Father   . Diabetes Father   . CAD Father   . Diabetes Sister   . COPD Brother   . Diabetes Sister      Medications: Current Outpatient Medications  Medication Sig Dispense Refill  . ACCU-CHEK AVIVA PLUS test strip USE TO CHECK BLOOD SUGAR 3 TIMES DAILY  11  . aspirin 81 MG tablet Take 81 mg by mouth every morning.     [name] atorvastatin (LIPITOR) 20 MG tablet Take 20 mg by mouth every morning.     . clopidogrel (PLAVIX) 75 MG tablet Take 1 tablet (75 mg total) by mouth daily. 90 tablet 3  .  insulin lispro (HUMALOG) 100 UNIT/ML cartridge Inject 10-15 Units into the skin 2 (two) times daily. Pt ranges his insulin with his blood sugar readings    . insulin NPH Human (HUMULIN N,NOVOLIN N) 100 UNIT/ML injection Inject 25 Units into the skin 2 (two) times daily before a meal.     . Melatonin 3 MG TABS Take 3 mg by mouth at bedtime as needed.    . Multiple Vitamins-Minerals (MULTIVITAMIN WITH MINERALS) tablet Take 1 tablet by mouth daily.    . nitroGLYCERIN (NITROSTAT) 0.4 MG SL tablet Place 1 tablet (0.4 mg total) under the tongue every 5 (five) minutes as needed for chest pain (up to 3 doses). 25 tablet 3  . Cholecalciferol (VITAMIN D3) 5000 units TABS 5,000 IU OTC vitamin D3 daily. 90 tablet 3  . escitalopram (LEXAPRO) 10 MG tablet TAKE 1 TABLET BY MOUTH EVERY DAY 90 tablet 0  . isosorbide mononitrate (IMDUR) 30 MG 24 hr tablet TAKE 1 TABLET BY MOUTH EVERY DAY 30 tablet 2  . metoprolol succinate (TOPROL-XL) 25 MG 24 hr tablet TAKE 1 TABLET BY MOUTH DAILY . TAKE WITH OR IMMEDIATELY FOLLOWING A MEAL 30 tablet 11  . pantoprazole (PROTONIX) 20 MG tablet TAKE 1 TABLET BY MOUTH EVERY DAY 90 tablet 2   No current facility-administered medications for this visit.       Allergies:  No Known Allergies   Review of Systems: General:   No F/C, wt loss Pulm:   No DIB, SOB, pleuritic chest pain Card:  No CP, palpitations Abd:  No n/v/d or pain Ext:  No inc edema from baseline  Objective:  Blood pressure 127/61, pulse 78, height 5\' 6"  (1.676 m), weight 167 lb (75.8 kg). Body mass index is 26.95 kg/m. Gen:   Well NAD, A and O *3 HEENT:    [place]/AT, EOMI,  MMM Lungs:   Normal work of breathing. CTA B/L, no Wh, rhonchi Heart:   RRR, S1, S2 WNL's, no MRG Abd:   No gross distention Exts:    warm, pink,  Brisk capillary refill, warm and well perfused.  Psych:    No HI/SI, judgement and insight good, Euthymic mood. Full Affect.   Recent Results (from the past 2160 hour(s))  POCT UA - Microalbumin     Status: Abnormal   Collection Time: 02/01/17  2:02 PM  Result Value Ref Range   Microalbumin Ur, POC 80 mg/L   Creatinine, POC 200 mg/dL   Albumin/Creatinine Ratio, Urine, POC 30-300   CBC with Differential/Platelet     Status: Abnormal   Collection Time: 02/02/17  8:30 AM  Result Value Ref Range   WBC 3.8 3.4 - 10.8 x10E3/uL   RBC 4.47 4.14 - 5.80 x10E6/uL   Hemoglobin 12.9 (L) 13.0 - 17.7 g/dL   Hematocrit 38.3 37.5 - 51.0 %   MCV 86 79 - 97 fL   MCH 28.9 26.6 - 33.0 pg   MCHC 33.7 31.5 - 35.7 g/dL   RDW 15.6 (H) 12.3 - 15.4 %   Platelets 215 150 - 379 x10E3/uL   Neutrophils 51 Not Estab. %   Lymphs 36 Not Estab. %   Monocytes 9 Not Estab. %   Eos 3 Not Estab. %   Basos 1 Not Estab. %   Neutrophils Absolute 1.9 1.4 - 7.0 x10E3/uL   Lymphocytes Absolute 1.4 0.7 - 3.1 x10E3/uL   Monocytes Absolute 0.4 0.1 - 0.9 x10E3/uL   EOS (ABSOLUTE) 0.1 0.0 - 0.4 x10E3/uL   Basophils Absolute  0.0 0.0 - 0.2 x10E3/uL   Immature Granulocytes 0 Not Estab. %   Immature Grans (Abs) 0.0 0.0 - 0.1 x10E3/uL  Comprehensive metabolic panel     Status: Abnormal   Collection Time: 02/02/17  8:30 AM  Result Value Ref Range   Glucose 183 (H) 65 - 99 mg/dL   BUN 11 8  - 27 mg/dL   Creatinine, Ser 0.84 0.76 - 1.27 mg/dL   GFR calc non Af Amer 79 >59 mL/min/1.73   GFR calc Af Amer 92 >59 mL/min/1.73   BUN/Creatinine Ratio 13 10 - 24   Sodium 138 134 - 144 mmol/L   Potassium 4.6 3.5 - 5.2 mmol/L   Chloride 100 96 - 106 mmol/L   CO2 24 20 - 29 mmol/L   Calcium 9.0 8.6 - 10.2 mg/dL   Total Protein 6.7 6.0 - 8.5 g/dL   Albumin 4.0 3.5 - 4.7 g/dL   Globulin, Total 2.7 1.5 - 4.5 g/dL   Albumin/Globulin Ratio 1.5 1.2 - 2.2   Bilirubin Total 0.5 0.0 - 1.2 mg/dL   Alkaline Phosphatase 102 39 - 117 IU/L   AST 18 0 - 40 IU/L   ALT 15 0 - 44 IU/L  Hemoglobin A1c     Status: Abnormal   Collection Time: 02/02/17  8:30 AM  Result Value Ref Range   Hgb A1c MFr Bld 8.7 (H) 4.8 - 5.6 %    Comment:          Prediabetes: 5.7 - 6.4          Diabetes: >6.4          Glycemic control for adults with diabetes: <7.0    Est. average glucose Bld gHb Est-mCnc 203 mg/dL  Lipid panel     Status: None   Collection Time: 02/02/17  8:30 AM  Result Value Ref Range   Cholesterol, Total 122 100 - 199 mg/dL   Triglycerides 74 0 - 149 mg/dL   HDL 47 >39 mg/dL   VLDL Cholesterol Cal 15 5 - 40 mg/dL   LDL Calculated 60 0 - 99 mg/dL   Chol/HDL Ratio 2.6 0.0 - 5.0 ratio    Comment:                                   T. Chol/HDL Ratio                                             Men  Women                               1/2 Avg.Risk  3.4    3.3                                   Avg.Risk  5.0    4.4                                2X Avg.Risk  9.6    7.1  3X Avg.Risk 23.4   11.0   Phosphorus     Status: None   Collection Time: 02/02/17  8:30 AM  Result Value Ref Range   Phosphorus 3.2 2.5 - 4.5 mg/dL  Magnesium     Status: None   Collection Time: 02/02/17  8:30 AM  Result Value Ref Range   Magnesium 1.9 1.6 - 2.3 mg/dL  Vitamin B12     Status: Abnormal   Collection Time: 02/02/17  8:30 AM  Result Value Ref Range   Vitamin B-12 1,297 (H) 232 -  1,245 pg/mL  TSH     Status: None   Collection Time: 02/02/17  8:30 AM  Result Value Ref Range   TSH 2.940 0.450 - 4.500 uIU/mL  T4, free     Status: None   Collection Time: 02/02/17  8:30 AM  Result Value Ref Range   Free T4 1.20 0.82 - 1.77 ng/dL  VITAMIN D 25 Hydroxy (Vit-D Deficiency, Fractures)     Status: None   Collection Time: 02/02/17  8:30 AM  Result Value Ref Range   Vit D, 25-Hydroxy 34.4 30.0 - 100.0 ng/mL    Comment: Vitamin D deficiency has been defined by the [place] and an Endocrine Society practice guideline as a level of serum 25-OH vitamin D less than 20 ng/mL (1,2). The Endocrine Society went on to further define vitamin D insufficiency as a level between 21 and 29 ng/mL (2). 1. IOM (Institute of Medicine). 2010. Dietary reference    intakes for calcium and D. [place]: The    [employer]. 2. Holick MF, [place], Bischoff-Ferrari HA, et al.    Evaluation, treatment, and prevention of vitamin D    deficiency: an Endocrine Society clinical practice    guideline. JCEM. 2011 Jul; 96(7):1911-30.

## 2017-02-08 NOTE — Patient Instructions (Addendum)
Being 83 years old you do not need his tight sugar control as those of younger folks however please use this below as a guide.  Your A1c today was 8.7 and we would like it under 8 preferably under 7.5.   Please add Humalog: - 10 units before a smaller meal - 15 units before a larger meal   ( Check your blood blood sugar 2 hours after your meals ) Add the following Sliding Scale: - 150-175: + 1 unit  - 176-200: + 2 units  - 201-225: + 3 units  - 226-250: + 4 units  - 251-275: + 5 units - 276-300: + 6 units  Please return in 1.5 months with your sugar log.    Basic Rules for Patients with Type I Diabetes Mellitus  1. The American Diabetes Association (ADA) recommended targets: - fasting sugar <130 - after meal sugar <180 - HbA1C <7%  2. Engage in ?150 min moderate exercise per week  3. Make sure you have ?8h of sleep every night as this helps both blood sugars and your weight.  4. Always keep a sugar log (not only record in your meter) and bring it to all appointments with [name].  5. "15-15 rule" for hypoglycemia: if sugars are low, take 15 g of carbs** ("fast sugar" - e.g. 4 glucose tablets, 4 oz orange juice), wait 15 min, then check sugars again. If still <80, repeat. Continue  until your sugars >80, then eat a normal meal.   6. Teach family members and coworkers to inject glucagon. Have a glucagon set at home and one at work. They should call 911 after using the set.  7. Check sugar before driving. If <100, correct, and only start driving if sugars rise ?100. Check sugar every hour when on a long drive.  8. Check sugar before exercising. If <100, correct, and only start exercising if sugars rise ?100. Check sugar every hour when on a long exercise routine and 1h after you finished exercising.   If >250, check urine for ketones. If you have moderate-large ketones in urine, do not start exercise. Hydrate yourself with clear liquids and correct the high sugar. Recheck  sugars and ketones before attempting to exercise.  Be aware that you might need less insulin when exercising.  *intense, short, exercise bursts can increase your sugars, but  *less intense, longer (>1h), exercise routines can decrease your sugars.   9. Make sure you have a MedAlert bracelet or pendant mentioning "Type I Diabetes Mellitus". If you have a prior episode of severe hypoglycemia or hypoglycemia unawareness, it should also mention this.  10. Please do not walk barefoot. Inspect your feet for sores/cuts and let us know if you have them.  11. Please call [facility] with any questions and concerns [phone]).   **E.g. of "fast carbs":  first choice (15 g):  1 tube glucose gel, GlucoPouch 15, 2 oz glucose liquid  second choice (15-16 g):  3 or 4 glucose tablets (best taken      with water), 15 Dextrose Bits chewable  third choice (15-20 g):   cup fruit juice,  cup regular soda, 1 cup skim milk,  1 cup sports drink  fourth choice (15-20 g):  1 small tube [place] gel (not frosting), 2 tbsp raisins, 1 tbsp table sugar,  candy, jelly beans, gum drops - check package for carb amount   (adapted from: Lenice Pressman. "Insulin therapy and hypoglycemia" Endocrinol Metab Clin N Am 2012, 41: 57-87)  Dawn Phenomenon All people have the "dawn phenomenon," if they have diabetes or not.  The dawn phenomenon is a surge of hormones that the body produces daily in the early morning hours before you wake up.  People with diabetes don't have normal insulin responses to adjust for this, and may see their fasting glucose go up.  The rise in glucose is mostly because your body is making less insulin and more glucagon (a hormone that increases blood glucose) than it needs. The less insulin made by the pancreas, the more glucagon the pancreas makes as a result. Glucagon signals the liver to break down glycogen into glucose. This is why high fasting blood glucose  levels are common in people with type 2.  If your fasting glucose continues to be high, talk to your health care provider about options that may work with your lifestyle and regimen.

## 2017-02-14 DIAGNOSIS — E1165 Type 2 diabetes mellitus with hyperglycemia: Secondary | ICD-10-CM | POA: Diagnosis not present

## 2017-02-14 DIAGNOSIS — I2510 Atherosclerotic heart disease of native coronary artery without angina pectoris: Secondary | ICD-10-CM | POA: Diagnosis not present

## 2017-02-14 DIAGNOSIS — I10 Essential (primary) hypertension: Secondary | ICD-10-CM | POA: Diagnosis not present

## 2017-02-14 DIAGNOSIS — I700 Atherosclerosis of aorta: Secondary | ICD-10-CM | POA: Diagnosis not present

## 2017-02-14 DIAGNOSIS — J439 Emphysema, unspecified: Secondary | ICD-10-CM | POA: Diagnosis not present

## 2017-02-14 DIAGNOSIS — Z7982 Long term (current) use of aspirin: Secondary | ICD-10-CM | POA: Diagnosis not present

## 2017-02-15 DIAGNOSIS — J439 Emphysema, unspecified: Secondary | ICD-10-CM | POA: Diagnosis not present

## 2017-02-15 DIAGNOSIS — Z7982 Long term (current) use of aspirin: Secondary | ICD-10-CM | POA: Diagnosis not present

## 2017-02-15 DIAGNOSIS — I2510 Atherosclerotic heart disease of native coronary artery without angina pectoris: Secondary | ICD-10-CM | POA: Diagnosis not present

## 2017-02-15 DIAGNOSIS — E1165 Type 2 diabetes mellitus with hyperglycemia: Secondary | ICD-10-CM | POA: Diagnosis not present

## 2017-02-15 DIAGNOSIS — I700 Atherosclerosis of aorta: Secondary | ICD-10-CM | POA: Diagnosis not present

## 2017-02-15 DIAGNOSIS — I10 Essential (primary) hypertension: Secondary | ICD-10-CM | POA: Diagnosis not present

## 2017-02-16 ENCOUNTER — Other Ambulatory Visit: Payer: Self-pay | Admitting: Cardiology

## 2017-02-16 DIAGNOSIS — R0602 Shortness of breath: Secondary | ICD-10-CM

## 2017-02-28 DIAGNOSIS — I10 Essential (primary) hypertension: Secondary | ICD-10-CM | POA: Diagnosis not present

## 2017-02-28 DIAGNOSIS — E1165 Type 2 diabetes mellitus with hyperglycemia: Secondary | ICD-10-CM | POA: Diagnosis not present

## 2017-02-28 DIAGNOSIS — Z23 Encounter for immunization: Secondary | ICD-10-CM | POA: Diagnosis not present

## 2017-02-28 DIAGNOSIS — R946 Abnormal results of thyroid function studies: Secondary | ICD-10-CM | POA: Diagnosis not present

## 2017-02-28 DIAGNOSIS — Z794 Long term (current) use of insulin: Secondary | ICD-10-CM | POA: Diagnosis not present

## 2017-03-01 ENCOUNTER — Other Ambulatory Visit: Payer: Self-pay | Admitting: Cardiology

## 2017-03-06 ENCOUNTER — Other Ambulatory Visit: Payer: Self-pay | Admitting: Cardiology

## 2017-03-10 ENCOUNTER — Other Ambulatory Visit: Payer: Self-pay | Admitting: Cardiology

## 2017-03-12 NOTE — Telephone Encounter (Signed)
Medication Detail    Disp Refills Start End   pantoprazole (PROTONIX) 20 MG tablet 90 tablet 2 03/01/2017    Sig: TAKE 1 TABLET BY MOUTH EVERY DAY   Sent to pharmacy as: pantoprazole (PROTONIX) 20 MG tablet   E-Prescribing Status: Receipt confirmed by pharmacy (03/01/2017 12:46 PM EDT)   Pharmacy   [id number] - [place], [place].

## 2017-04-04 ENCOUNTER — Other Ambulatory Visit: Payer: Self-pay | Admitting: Cardiology

## 2017-04-09 DIAGNOSIS — H401131 Primary open-angle glaucoma, bilateral, mild stage: Secondary | ICD-10-CM | POA: Diagnosis not present

## 2017-04-09 DIAGNOSIS — E113293 Type 2 diabetes mellitus with mild nonproliferative diabetic retinopathy without macular edema, bilateral: Secondary | ICD-10-CM | POA: Diagnosis not present

## 2017-04-09 DIAGNOSIS — H35372 Puckering of macula, left eye: Secondary | ICD-10-CM | POA: Diagnosis not present

## 2017-04-09 DIAGNOSIS — H52203 Unspecified astigmatism, bilateral: Secondary | ICD-10-CM | POA: Diagnosis not present

## 2017-04-23 ENCOUNTER — Other Ambulatory Visit: Payer: Self-pay | Admitting: Family Medicine

## 2017-04-23 ENCOUNTER — Telehealth: Payer: Self-pay | Admitting: Family Medicine

## 2017-04-23 DIAGNOSIS — F4323 Adjustment disorder with mixed anxiety and depressed mood: Secondary | ICD-10-CM

## 2017-04-23 NOTE — Telephone Encounter (Signed)
Called patient and informed him MPulliam, CMA/RT(R)

## 2017-04-23 NOTE — Telephone Encounter (Signed)
Patient walked in this morning with his lexapro prescription and stated that the pharmacy told him that he he could get a cheaper rate on his meds but couldn't relay how, he seemed a little confused by what they were saying but liked the idea of saving money. He advised calling [facility] [address] to figure out what they were taking about

## 2017-04-23 NOTE — Telephone Encounter (Signed)
Called pharmacy, not sure what was told to the patient no notes were made in their system. MPulliam, CMA/RT(R)

## 2017-04-25 ENCOUNTER — Encounter: Payer: Self-pay | Admitting: Family Medicine

## 2017-04-25 ENCOUNTER — Ambulatory Visit (INDEPENDENT_AMBULATORY_CARE_PROVIDER_SITE_OTHER): Payer: Medicare Other | Admitting: Family Medicine

## 2017-04-25 VITALS — BP 140/78 | HR 82 | Ht 66.0 in | Wt 174.0 lb

## 2017-04-25 DIAGNOSIS — F339 Major depressive disorder, recurrent, unspecified: Secondary | ICD-10-CM | POA: Diagnosis not present

## 2017-04-25 DIAGNOSIS — I5030 Unspecified diastolic (congestive) heart failure: Secondary | ICD-10-CM

## 2017-04-25 DIAGNOSIS — I2510 Atherosclerotic heart disease of native coronary artery without angina pectoris: Secondary | ICD-10-CM

## 2017-04-25 DIAGNOSIS — E1169 Type 2 diabetes mellitus with other specified complication: Secondary | ICD-10-CM | POA: Diagnosis not present

## 2017-04-25 DIAGNOSIS — E782 Mixed hyperlipidemia: Secondary | ICD-10-CM

## 2017-04-25 DIAGNOSIS — I10 Essential (primary) hypertension: Secondary | ICD-10-CM | POA: Diagnosis not present

## 2017-04-25 DIAGNOSIS — N419 Inflammatory disease of prostate, unspecified: Secondary | ICD-10-CM

## 2017-04-25 DIAGNOSIS — F4323 Adjustment disorder with mixed anxiety and depressed mood: Secondary | ICD-10-CM | POA: Diagnosis not present

## 2017-04-25 DIAGNOSIS — E1129 Type 2 diabetes mellitus with other diabetic kidney complication: Secondary | ICD-10-CM

## 2017-04-25 DIAGNOSIS — E1121 Type 2 diabetes mellitus with diabetic nephropathy: Secondary | ICD-10-CM

## 2017-04-25 DIAGNOSIS — R809 Proteinuria, unspecified: Secondary | ICD-10-CM | POA: Diagnosis not present

## 2017-04-25 DIAGNOSIS — E1159 Type 2 diabetes mellitus with other circulatory complications: Secondary | ICD-10-CM

## 2017-04-25 DIAGNOSIS — Z794 Long term (current) use of insulin: Secondary | ICD-10-CM | POA: Diagnosis not present

## 2017-04-25 LAB — POCT GLYCOSYLATED HEMOGLOBIN (HGB A1C): Hemoglobin A1C: 9.1

## 2017-04-25 MED ORDER — FLUOXETINE HCL 20 MG PO TABS: 20.0000 mg | ORAL_TABLET | Freq: Every day | ORAL | 1 refills | Status: DC

## 2017-04-25 NOTE — Progress Notes (Signed)
Impression and Recommendations:    1. Type 2 diabetes mellitus with microalbuminuria, with long-term current use of insulin ([place])   2. Prostatitis, unspecified prostatitis type   3. Adjustment disorder with mixed anxiety and depressed mood   4. Diabetic nephropathy with proteinuria ([place])   5. Depression, recurrent ([place])   6. Hypertension associated with diabetes ([place])   7. Combined hyperlipidemia associated with type 2 diabetes mellitus ([place])     1. Type 2 diabetes mellitus with microalbuminuria, with long-term current use of insulin (HCC) - POCT glycosylated hemoglobin (Hb A1C) today in office is worse. -Since A1c is up to 9.1 from 8.7 about 2-1/2 months ago.   - Per patient he used to run in the 6.5-7.5 range for many years until he changed endocrinologist from Dr. Lysbeth Penner to [name] 4-5 years ago and also when his depression hit when he lost his wife.  Now cooks for himself and usually not the healthiest - We had extensive conversation about use of both of his insulin.  He will do a better job at covering for each meal.   - Handouts given on how to count carbohydrates, after our extensive conversation.  Handwritten notes were also given to patient from our discussion in the office.   - We will see him in 4 weeks and he will bring in a log of his blood sugars.  2. Prostatitis, unspecified prostatitis type -Patient wishes to hold off on urology consult to see why he has so much residual urine. -Patient also declines prostate exam today and also declines BPH type medicines to see if this has a positive effect on his symptoms. -He would like to see about getting his mood under better control first and then going to urology after that is improved if needed.  He thinks it is related to his mood since sx occurred around time wife died.     3. Adjustment disorder with mixed anxiety and depressed mood -Stop Lexapro add Prozac. -Follow-up in 4 weeks to see how his mood is as well as blood  sugars.  4. Diabetic nephropathy with proteinuria ([place]) -Patient understands that with an A1c of 9.1 this time, his proteinuria and kidney function will deteriorate.  -Pt was in the office today for 40+ minutes, with over 50% time spent in face to face counseling of patients various medical conditions, treatment plans of those medical conditions including medicine management and lifestyle modification, strategies to improve health and well being; and in coordination of care. SEE ABOVE FOR DETAILS  Education and routine counseling performed. Handouts provided.  Orders Placed This Encounter  Procedures  . POCT glycosylated hemoglobin (Hb A1C)   Gross side effects, risk and benefits, and alternatives of medications and treatment plan in general discussed with patient.  Patient is aware that all medications have potential side effects and we are unable to predict every side effect or drug-drug interaction that may occur.   Patient will call with any questions prior to using medication if they have concerns.  Expresses verbal understanding and consents to current therapy and treatment regimen.  No barriers to understanding were identified.  Red flag symptoms and signs discussed in detail.  Patient expressed understanding regarding what to do in case of emergency\urgent symptoms  Please see AVS handed out to patient at the end of our visit for further patient instructions/ counseling done pertaining to today's office visit.   Return in about 4 weeks (around 05/23/2017) for f/up started prozac, d/ced  lexapro,; GO over BS- bring log.     Note: This document was prepared using Dragon voice recognition software and may include unintentional dictation errors.  [name] 5:32  PM --------------------------------------------------------------------------------------------------------------------------------------------------------------------------------------------------------------------------------------------    Subjective:    CC:  Chief Complaint  Patient presents with  . Follow-up    HPI: Seth Abbott is a 83 y.o. male who presents to [place] at [facility] today for follow-up of mood.  Per Dorothea Ogle patient walked into the office last week and stated his mood was not doing well and an appointment was made for patient.  -MOOD:   Started on Lexapro on 01/31/2017.  Mood deteriorated ever since patient lost his wife 4 years ago.  His sons and children were worried about his mood.  Per patient he has been on the medicine-Lexapro-  for approximately 10 weeks now, no real improvement in the mood.  Takes it daily.   Has been compliant.  He states that he has good days and bad days.  When it is bad he does not feel like getting out of bed.  Some days he even wishes he would die.  He denies he has any plan or has thought about how he would take his own life but thinks he would be better off if he were with his wife then here on earth.   - Urinary Freq / incomplete bladder emptying:   pt wakes up around 4 am- has to urinate 4-5 times in an hour during that time of night- going on since depression for past 3-4 yrs.   per patient every other day he has excessive urination and frequency-he will have it during the day and at night on certain days when it is bad.   Can urinate twice every hour, then in night he can sleep to about 4 AM and then he is up urinating several times.  Per patient he is never been to urologist and had this checked out.  This is been going on for 3-4 years.    - DM:   Sees and managed by Dr Buddy Duty.  FBS-  300-95;  2 hr PP- 200-300.  Patient tells me he used to be much better controlled when he had his prior endocrinologist before [name]  4-5 years ago.  A1c is used to be the 6.5-7.5 range.  Patient admits he is not counting carbs nor does he cover with each meal.  Per patient he had gone to diabetes education and understood how to cover for meals but admits he forgot this information.  Also living alone is more difficult for him when cooking for himself.  Admits he does not always make the best choices. Few "fresh meals".   ---------------------------------------------------------------------------------------------- Prior OV:    DM- pt sees Dr Buddy Duty- Endo.   Patient's blood sugars have been running in the 140s to 250s fasting.  He states that they have been very variable and up and down  Lost wife- 4 yrs ago.  Lives alone.    has been having episodes where he doesn't feel like doing much and very apathetic.  Patient states she's been having some feelings of depression and lacking motivation to get up and even get out of bed sometimes.  No thoughts of suicide or hurting himself or hurting others.  Was on Xanax they believe for short time after his wife died but nothing since.  Prior doctor came to blow his feelings off until initially over his wife of 60+ years  in 6 months or less.   Unmotivated and not finding pleasure in usual pleasurable things.  Son, Marya Amsler, lives next door. Fatima Sanger lives close as well.    Retired from [employer] 40+ yrs.   Patient recently seen in the hospital admitted on 7\24\18 and discharged 7\27\18.  He presented to my clinic on 724 with progressive worsening of dyspnea on exertion of several weeks' duration and some intermittent chest pain as well as his son said he was confused.  At that time we sent him to the emergency room and he was admitted.  He was noted to have a low-grade temperature and have a positive d-dimer.  Workup was essentially negative with troponins CT head and CT chest but it did show some mild emphysema and spinal megaly.  Cardiology was consult that at that time.  Patient underwent a left  heart cath on 7\26.  He has a history of prior coronary artery disease status post PC I of the RCA back in 8 of 2016.  He was medically managed with LAD disease.  At this time his left heart cath revealed progression and cardiology increase his Imdur from 15-30 mg daily.  They are following him closely.  Patient has had diabetes for at least 30 years.  He's managed by endocrinology-[name] of [facility]-and has been managed by endocrinology for 20+ years.  He's been on insulin 20+ years.  He takes NPH twice a day and Humalog twice daily with meals.  A1c's have always been well controlled up until about 1-2 years ago when it's been a little higher in the 7.5-8.5 range.   Depression screen [facility] 2/9 04/25/2017 01/31/2017  Decreased Interest 2 2  Down, Depressed, Hopeless 2 1  PHQ - 2 Score 4 3  Altered sleeping 1 1  Tired, decreased energy 2 1  Change in appetite 0 0  Feeling bad or failure about yourself  0 1  Trouble concentrating 0 1  Moving slowly or fidgety/restless 0 0  Suicidal thoughts 0 1  PHQ-9 Score 7 8  Difficult doing work/chores - Somewhat difficult     GAD 7 : Generalized Anxiety Score 01/31/2017  Nervous, Anxious, on Edge 1  Control/stop worrying 0  Worry too much - different things 1  Trouble relaxing 1  Restless 0  Easily annoyed or irritable 1  Afraid - awful might happen 0  Total GAD 7 Score 4  Anxiety Difficulty Somewhat difficult      Problem  Diabetic Nephropathy With Proteinuria (Hcc)   DM HPI: - managed by ENDO- Dr Buddy Duty of [facility] controlled  Last diabetic eye exam was per Dr Bo Merino- UTD- asked CMA to obtain report today from the doctor.   Last A1C in the office was:  Lab Results  Component Value Date   HGBA1C 9.1 04/25/2017   HGBA1C 8.7 (H) 02/02/2017   HGBA1C 8.3 11/28/2016    Lab Results  Component Value Date   MICROALBUR 80 02/01/2017   LDLCALC 60 02/02/2017   CREATININE 0.84 02/02/2017       Wt Readings from Last 3 Encounters:  04/25/17 174 lb (78.9 kg)  02/08/17 167 lb (75.8 kg)  01/31/17 167 lb 4.8 oz (75.9 kg)    BP Readings from Last 3 Encounters:  04/25/17 140/78  02/08/17 127/61  01/31/17 124/62    Pulse Readings from Last 3 Encounters:  04/25/17 82  02/08/17 78  01/31/17 82      Combined Hyperlipidemia Associated With Type  2 Diabetes Mellitus (Hcc)   Cholesterol:  on a statin and managed by cardiology.  Last cholesterol panel was at goal with LDL less than 70.   Patient tries to stay busy with working around in his yard and garden-weather permitting.  CHOL HPI:  -Taking meds.  Denies side effects. -Poor compliance with dietary and lifestyle modification.  Last lipid panel as follows:  Lab Results  Component Value Date   CHOL 122 02/02/2017   HDL 47 02/02/2017   LDLCALC 60 02/02/2017   TRIG 74 02/02/2017   CHOLHDL 2.6 02/02/2017    Hepatic Function Latest Ref Rng & Units 02/02/2017 01/02/2017 09/25/2016  Total Protein 6.0 - 8.5 g/dL 6.7 5.8(L) -  Albumin 3.5 - 4.7 g/dL 4.0 3.0(L) -  AST 0 - 40 IU/L '18 29 14  ' ALT 0 - 44 IU/L '15 31 12  ' Alk Phosphatase 39 - 117 IU/L 102 101 86  Total Bilirubin 0.0 - 1.2 mg/dL 0.5 1.3(H) -  Bilirubin, Direct <=0.2 mg/dL - - -       Type II Diabetes mellitus with microalbuminuria, with long-term current use of insulin (HCC)    DM since 1980 or earlier- than 8 yrs later -  was on insulin.     Typical A1c's around 6.5-7.0 for many yrs.   Recently - w/in past 1-2 yrs has been a little higher--> not over 8.0.   - eye exam- q 54mo [facility]  - Dr KBuddy Duty DM foot exams.   - no nephropathy retinopathy or neuropathy.   Hypertension Associated With Diabetes (Hcc)   -Patient sees Dr. TGolden Hurterof cardiology. -On Toprol-XL 25 mg every morning and Imdur 30 mg daily. -Ramapril was held at recent hospitalization due to low sodium. - Bp under good control      Wt Readings from  Last 3 Encounters:  04/25/17 174 lb (78.9 kg)  02/08/17 167 lb (75.8 kg)  01/31/17 167 lb 4.8 oz (75.9 kg)   BP Readings from Last 3 Encounters:  04/25/17 140/78  02/08/17 127/61  01/31/17 124/62   Pulse Readings from Last 3 Encounters:  04/25/17 82  02/08/17 78  01/31/17 82   BMI Readings from Last 3 Encounters:  04/25/17 28.08 kg/m  02/08/17 26.95 kg/m  01/31/17 27.00 kg/m     Patient Care Team    Relationship Specialty Notifications Start End  OMellody Dance DO PCP - General Family Medicine  01/31/17   TSueanne Margarita MD PCP - Cardiology Cardiology  10/12/13    Comment: MSanta Lighter MD Consulting Physician Endocrinology  01/31/17      Patient Active Problem List   Diagnosis Date Noted  . Diabetic nephropathy with proteinuria ([facility] 01/31/2017    Priority: High  . Combined hyperlipidemia associated with type 2 diabetes mellitus ([facility] 01/31/2017    Priority: High  . Type II Diabetes mellitus with microalbuminuria, with long-term current use of insulin (HCC)     Priority: High  . Hypertension associated with diabetes ([facility]     Priority: High  . S/P coronary artery stent placement     Priority: Medium  . grade I Diastolic heart failure secondary to coronary artery disease ([facility] 02/09/2015    Priority: Medium  . h/o TIA due to embolism ([facility]- 10-15 yrs ago     Priority: Medium  . Vitamin D insufficiency 02/08/2017  . Adjustment disorder with mixed anxiety and depressed mood 02/08/2017  . Arthritis 02/04/2017  . Sepsis ([facility] 01/03/2017  .  Acute metabolic encephalopathy 43/20/0379  . Hyponatremia 01/03/2017  . Bronchitis 01/03/2017  . SOB (shortness of breath) 08/16/2015  . Abnormal PFTs (pulmonary function tests) 08/16/2015  . Coronary artery calcification seen on CAT scan   . Aortic stenosis, mild   . Dilated aortic root ([place])   . Prostatitis   . Shingles     Past Medical history, Surgical history, Family history, Social history, Allergies and  Medications have been entered into the medical record, reviewed and changed as needed.    Current Meds  Medication Sig  . ACCU-CHEK AVIVA PLUS test strip USE TO CHECK BLOOD SUGAR 3 TIMES DAILY  . aspirin 81 MG tablet Take 81 mg by mouth every morning.   [name] atorvastatin (LIPITOR) 20 MG tablet Take 20 mg by mouth every morning.   . Cholecalciferol (VITAMIN D3) 5000 units TABS 5,000 IU OTC vitamin D3 daily.  . clopidogrel (PLAVIX) 75 MG tablet Take 1 tablet (75 mg total) by mouth daily.  . insulin lispro (HUMALOG) 100 UNIT/ML cartridge Inject 10-15 Units into the skin 2 (two) times daily. Pt ranges his insulin with his blood sugar readings  . insulin NPH Human (HUMULIN N,NOVOLIN N) 100 UNIT/ML injection Inject 25 Units into the skin 2 (two) times daily before a meal.   . isosorbide mononitrate (IMDUR) 30 MG 24 hr tablet TAKE 1 TABLET BY MOUTH EVERY DAY  . Melatonin 3 MG TABS Take 3 mg by mouth at bedtime as needed.  . metoprolol succinate (TOPROL-XL) 25 MG 24 hr tablet TAKE 1 TABLET BY MOUTH DAILY . TAKE WITH OR IMMEDIATELY FOLLOWING A MEAL  . Multiple Vitamins-Minerals (MULTIVITAMIN WITH MINERALS) tablet Take 1 tablet by mouth daily.  . nitroGLYCERIN (NITROSTAT) 0.4 MG SL tablet Place 1 tablet (0.4 mg total) under the tongue every 5 (five) minutes as needed for chest pain (up to 3 doses).  . pantoprazole (PROTONIX) 20 MG tablet TAKE 1 TABLET BY MOUTH EVERY DAY  . [DISCONTINUED] escitalopram (LEXAPRO) 10 MG tablet TAKE 1 TABLET BY MOUTH EVERY DAY    Allergies:  No Known Allergies   Review of Systems: Review of Systems: General:   No F/C, wt loss Pulm:   No DIB, SOB, pleuritic chest pain Card:  No CP, palpitations Abd:  No n/v/d or pain Ext:  No inc edema from baseline Psych: no SI/ HI    Objective:   Blood pressure 140/78, pulse 82, height '5\' 6"'  (1.676 m), weight 174 lb (78.9 kg). Body mass index is 28.08 kg/m. General:  Well Developed, well nourished, appropriate for stated age.    Neuro:  Alert and oriented,  extra-ocular muscles intact  HEENT:  Normocephalic, atraumatic, neck supple  Skin:  no gross rash, warm, pink. Cardiac:  RRR, S1 S2 Respiratory:  ECTA B/L and A/P, Not using accessory muscles, speaking in full sentences- unlabored. Vascular:  Ext warm, no cyanosis apprec.; cap RF less 2 sec. Psych:  No HI/SI, judgement and insight good, Euthymic mood. Full Affect.

## 2017-04-25 NOTE — Patient Instructions (Signed)
-Please stop the Lexapro and you are going to start the Prozac or fluoxetine.  This new prescription was sent to your pharmacy.  I will see you in 4 weeks to see how you are doing on the new medicine as well as bring in a log of your blood sugars I want them to be under better control. Please see the handout below.    Try to walk 5-10 minutes twice daily to start then slowly add on 5 minutes every couple weeks until you do a total of 30-45 minutes daily.  -Please try to use your Humalog insulin lispro with each meal.  Still continue to take the insulin NPH twice daily.    Please add Humalog: - 10 units before a smaller meal - 15 units before a larger meal  Add the following Sliding Scale: - 150-175: + 1 unit  - 176-200: + 2 units  - 201-225: + 3 units  - 226-250: + 4 units  - 251-275: + 5 units - 276-300: + 6 units  Please return in 1.5 months with your sugar log.       Carbohydrate Counting for Diabetes Mellitus, Adult Carbohydrate counting is a method for keeping track of how many carbohydrates you eat. Eating carbohydrates naturally increases the amount of sugar (glucose) in the blood. Counting how many carbohydrates you eat helps keep your blood glucose within normal limits, which helps you manage your diabetes (diabetes mellitus). It is important to know how many carbohydrates you can safely have in each meal. This is different for every person. A diet and nutrition specialist (registered dietitian) can help you make a meal plan and calculate how many carbohydrates you should have at each meal and snack. Carbohydrates are found in the following foods:  Grains, such as breads and cereals.  Dried beans and soy products.  Starchy vegetables, such as potatoes, peas, and corn.  Fruit and fruit juices.  Milk and yogurt.  Sweets and snack foods, such as cake, cookies, candy, chips, and soft drinks.  How do I count carbohydrates? There are two ways to count carbohydrates  in food. You can use either of the methods or a combination of both. Reading "Nutrition Facts" on packaged food The "Nutrition Facts" list is included on the labels of almost all packaged foods and beverages in the [place] It includes:  The serving size.  Information about nutrients in each serving, including the grams (g) of carbohydrate per serving.  To use the "Nutrition Facts":  Decide how many servings you will have.  Multiply the number of servings by the number of carbohydrates per serving.  The resulting number is the total amount of carbohydrates that you will be having.  Learning standard serving sizes of other foods When you eat foods containing carbohydrates that are not packaged or do not include "Nutrition Facts" on the label, you need to measure the servings in order to count the amount of carbohydrates:  Measure the foods that you will eat with a food scale or measuring cup, if needed.  Decide how many standard-size servings you will eat.  Multiply the number of servings by 15. Most carbohydrate-rich foods have about 15 g of carbohydrates per serving. ? For example, if you eat 8 oz (170 g) of strawberries, you will have eaten 2 servings and 30 g of carbohydrates (2 servings x 15 g = 30 g).  For foods that have more than one food mixed, such as soups and casseroles, you must count the carbohydrates  in each food that is included.  The following list contains standard serving sizes of common carbohydrate-rich foods. Each of these servings has about 15 g of carbohydrates:   hamburger bun or  English muffin.   oz (15 mL) syrup.   oz (14 g) jelly.  1 slice of bread.  1 six-inch tortilla.  3 oz (85 g) cooked rice or pasta.  4 oz (113 g) cooked dried beans.  4 oz (113 g) starchy vegetable, such as peas, corn, or potatoes.  4 oz (113 g) hot cereal.  4 oz (113 g) mashed potatoes or  of a large baked potato.  4 oz (113 g) canned or frozen fruit.  4 oz (120  mL) fruit juice.  4-6 crackers.  6 chicken nuggets.  6 oz (170 g) unsweetened dry cereal.  6 oz (170 g) plain fat-free yogurt or yogurt sweetened with artificial sweeteners.  8 oz (240 mL) milk.  8 oz (170 g) fresh fruit or one small piece of fruit.  24 oz (680 g) popped popcorn.  Example of carbohydrate counting Sample meal  3 oz (85 g) chicken breast.  6 oz (170 g) brown rice.  4 oz (113 g) corn.  8 oz (240 mL) milk.  8 oz (170 g) strawberries with sugar-free whipped topping. Carbohydrate calculation 1. Identify the foods that contain carbohydrates: ? Rice. ? Corn. ? Milk. ? Strawberries. 2. Calculate how many servings you have of each food: ? 2 servings rice. ? 1 serving corn. ? 1 serving milk. ? 1 serving strawberries. 3. Multiply each number of servings by 15 g: ? 2 servings rice x 15 g = 30 g. ? 1 serving corn x 15 g = 15 g. ? 1 serving milk x 15 g = 15 g. ? 1 serving strawberries x 15 g = 15 g. 4. Add together all of the amounts to find the total grams of carbohydrates eaten: ? 30 g + 15 g + 15 g + 15 g = 75 g of carbohydrates total. This information is not intended to replace advice given to you by your health care provider. Make sure you discuss any questions you have with your health care provider. Document Released: 05/29/2005 Document Revised: 12/17/2015 Document Reviewed: 11/10/2015 Elsevier Interactive Patient Education  2018 [place] for Patients with Type I Diabetes Mellitus  1. The American Diabetes Association (ADA) recommended targets: - fasting sugar <150 - after meal sugar <200 - HbA1C <8 %  2. Engage in ?150 min moderate exercise per week  3. Make sure you have ?8h of sleep every night as this helps both blood sugars and your weight.  4. Always keep a sugar log (not only record in your meter) and bring it to all appointments with [name].  5. "15-15 rule" for hypoglycemia: if sugars are low, take 15 g of  carbs** ("fast sugar" - e.g. 4 glucose tablets, 4 oz orange juice), wait 15 min, then check sugars again. If still <80, repeat. Continue  until your sugars >80, then eat a normal meal.   6. Teach family members and coworkers to inject glucagon. Have a glucagon set at home and one at work. They should call 911 after using the set.  7. Check sugar before driving. If <100, correct, and only start driving if sugars rise ?100. Check sugar every hour when on a long drive.  8. Check sugar before exercising. If <100, correct, and only start exercising if sugars  rise ?100. Check sugar every hour when on a long exercise routine and 1h after you finished exercising.    Be aware that you might need less insulin when exercising.  *intense, short, exercise bursts can increase your sugars, but  *less intense, longer (>1h), exercise routines can decrease your sugars.   9. Make sure you have a MedAlert bracelet or pendant mentioning "Type I Diabetes Mellitus". If you have a prior episode of severe hypoglycemia or hypoglycemia unawareness, it should also mention this.  10. Please do not walk barefoot. Inspect your feet for sores/cuts and let us know if you have them.    **E.g. of "fast carbs":  first choice (15 g):  1 tube glucose gel, GlucoPouch 15, 2 oz glucose liquid  second choice (15-16 g):  3 or 4 glucose tablets (best taken      with water), 15 Dextrose Bits chewable  third choice (15-20 g):   cup fruit juice,  cup regular soda, 1 cup skim milk,  1 cup sports drink  fourth choice (15-20 g):  1 small tube [place] gel (not frosting), 2 tbsp raisins, 1 tbsp table sugar,  candy, jelly beans, gum drops - check package for carb amount   (adapted from: Lenice Pressman. "Insulin therapy and hypoglycemia" Endocrinol Metab Clin N Am 2012, 41: 57-87)

## 2017-04-30 ENCOUNTER — Ambulatory Visit: Payer: Medicare Other | Admitting: Cardiology

## 2017-05-14 ENCOUNTER — Other Ambulatory Visit: Payer: Medicare Other | Admitting: *Deleted

## 2017-05-14 DIAGNOSIS — E785 Hyperlipidemia, unspecified: Secondary | ICD-10-CM

## 2017-05-14 LAB — LIPID PANEL
CHOLESTEROL TOTAL: 126 mg/dL (ref 100–199)
Chol/HDL Ratio: 2 ratio (ref 0.0–5.0)
HDL: 62 mg/dL (ref >39)
LDL Calculated: 54 mg/dL (ref 0–99)
Triglycerides: 48 mg/dL (ref 0–149)
VLDL Cholesterol Cal: 10 mg/dL (ref 5–40)

## 2017-05-14 LAB — HEPATIC FUNCTION PANEL
ALK PHOS: 103 IU/L (ref 39–117)
ALT: 17 IU/L (ref 0–44)
AST: 17 IU/L (ref 0–40)
Albumin: 4 g/dL (ref 3.5–4.7)
Bilirubin Total: 0.5 mg/dL (ref 0.0–1.2)
Bilirubin, Direct: 0.18 mg/dL (ref 0.00–0.40)
Total Protein: 6.1 g/dL (ref 6.0–8.5)

## 2017-05-16 NOTE — Progress Notes (Signed)
Normal labs.

## 2017-05-23 ENCOUNTER — Ambulatory Visit: Payer: Medicare Other | Admitting: Family Medicine

## 2017-05-23 ENCOUNTER — Other Ambulatory Visit: Payer: Self-pay | Admitting: Cardiology

## 2017-05-23 DIAGNOSIS — R0602 Shortness of breath: Secondary | ICD-10-CM

## 2017-05-28 ENCOUNTER — Ambulatory Visit: Payer: Medicare Other | Admitting: Family Medicine

## 2017-06-20 ENCOUNTER — Other Ambulatory Visit: Payer: Self-pay | Admitting: Family Medicine

## 2017-06-20 DIAGNOSIS — F4323 Adjustment disorder with mixed anxiety and depressed mood: Secondary | ICD-10-CM

## 2017-06-20 DIAGNOSIS — F339 Major depressive disorder, recurrent, unspecified: Secondary | ICD-10-CM

## 2017-06-25 ENCOUNTER — Ambulatory Visit (INDEPENDENT_AMBULATORY_CARE_PROVIDER_SITE_OTHER): Payer: Medicare Other | Admitting: Family Medicine

## 2017-06-25 ENCOUNTER — Encounter: Payer: Self-pay | Admitting: Family Medicine

## 2017-06-25 VITALS — BP 121/59 | HR 70 | Ht 66.0 in | Wt 174.5 lb

## 2017-06-25 DIAGNOSIS — F4323 Adjustment disorder with mixed anxiety and depressed mood: Secondary | ICD-10-CM

## 2017-06-25 DIAGNOSIS — E1129 Type 2 diabetes mellitus with other diabetic kidney complication: Secondary | ICD-10-CM | POA: Diagnosis not present

## 2017-06-25 DIAGNOSIS — F339 Major depressive disorder, recurrent, unspecified: Secondary | ICD-10-CM | POA: Diagnosis not present

## 2017-06-25 DIAGNOSIS — R809 Proteinuria, unspecified: Secondary | ICD-10-CM

## 2017-06-25 DIAGNOSIS — Z794 Long term (current) use of insulin: Secondary | ICD-10-CM | POA: Diagnosis not present

## 2017-06-25 MED ORDER — FLUOXETINE HCL 20 MG PO TABS: 20.0000 mg | ORAL_TABLET | Freq: Every day | ORAL | 1 refills | Status: DC

## 2017-06-25 NOTE — Patient Instructions (Addendum)
1 month follow-up we will recheck your A1c.  Please make sure you take the Humalog short acting with each meal and dose appropriately based on how many carbohydrates you eat in the amount of food you eat.  .- keep f/up with Dr Buddy Duty next Mon- bring me labs from that [facility], have him send me any notes that he will have.     Living With Depression Everyone experiences occasional disappointment, sadness, and loss in their lives. When you are feeling down, blue, or sad for at least 2 weeks in a row, it may mean that you have depression. Depression can affect your thoughts and feelings, relationships, daily activities, and physical health. It is caused by changes in the way your brain functions. If you receive a diagnosis of depression, your health care provider will tell you which type of depression you have and what treatment options are available to you. If you are living with depression, there are ways to help you recover from it and also ways to prevent it from coming back. How to cope with lifestyle changes Coping with stress Stress is your body's reaction to life changes and events, both good and bad. Stressful situations may include:  Getting married.  The death of a spouse.  Losing a job.  Retiring.  Having a baby.  Stress can last just a few hours or it can be ongoing. Stress can play a major role in depression, so it is important to learn both how to cope with stress and how to think about it differently. Talk with your health care provider or a counselor if you would like to learn more about stress reduction. He or she may suggest some stress reduction techniques, such as:  Music therapy. This can include creating music or listening to music. Choose music that you enjoy and that inspires you.  Mindfulness-based meditation. This kind of meditation can be done while sitting or walking. It involves being aware of your normal breaths, rather than trying to control your  breathing.  Centering prayer. This is a kind of meditation that involves focusing on a spiritual word or phrase. Choose a word, phrase, or sacred image that is meaningful to you and that brings you peace.  Deep breathing. To do this, expand your stomach and inhale slowly through your nose. Hold your breath for 3-5 seconds, then exhale slowly, allowing your stomach muscles to relax.  Muscle relaxation. This involves intentionally tensing muscles then relaxing them.  Choose a stress reduction technique that fits your lifestyle and personality. Stress reduction techniques take time and practice to develop. Set aside 5-15 minutes a day to do them. Therapists can offer training in these techniques. The training may be covered by some insurance plans. Other things you can do to manage stress include:  Keeping a stress diary. This can help you learn what triggers your stress and ways to control your response.  Understanding what your limits are and saying no to requests or events that lead to a schedule that is too full.  Thinking about how you respond to certain situations. You may not be able to control everything, but you can control how you react.  Adding humor to your life by watching funny films or TV shows.  Making time for activities that help you relax and not feeling guilty about spending your time this way.  Medicines Your health care provider may suggest certain medicines if he or she feels that they will help improve your condition. Avoid using alcohol  and other substances that may prevent your medicines from working properly (may interact). It is also important to:  Talk with your pharmacist or health care provider about all the medicines that you take, their possible side effects, and what medicines are safe to take together.  Make it your goal to take part in all treatment decisions (shared decision-making). This includes giving input on the side effects of medicines. It is best if  shared decision-making with your health care provider is part of your total treatment plan.  If your health care provider prescribes a medicine, you may not notice the full benefits of it for 4-8 weeks. Most people who are treated for depression need to be on medicine for at least 6-12 months after they feel better. If you are taking medicines as part of your treatment, do not stop taking medicines without first talking to your health care provider. You may need to have the medicine slowly decreased (tapered) over time to decrease the risk of harmful side effects. Relationships Your health care provider may suggest family therapy along with individual therapy and drug therapy. While there may not be family problems that are causing you to feel depressed, it is still important to make sure your family learns as much as they can about your mental health. Having your family's support can help make your treatment successful. How to recognize changes in your condition Everyone has a different response to treatment for depression. Recovery from major depression happens when you have not had signs of major depression for two months. This may mean that you will start to:  Have more interest in doing activities.  Feel less hopeless than you did 2 months ago.  Have more energy.  Overeat less often, or have better or improving appetite.  Have better concentration.  Your health care provider will work with you to decide the next steps in your recovery. It is also important to recognize when your condition is getting worse. Watch for these signs:  Having fatigue or low energy.  Eating too much or too little.  Sleeping too much or too little.  Feeling restless, agitated, or hopeless.  Having trouble concentrating or making decisions.  Having unexplained physical complaints.  Feeling irritable, angry, or aggressive.  Get help as soon as you or your family members notice these symptoms coming  back. How to get support and help from others How to talk with friends and family members about your condition Talking to friends and family members about your condition can provide you with one way to get support and guidance. Reach out to trusted friends or family members, explain your symptoms to them, and let them know that you are working with a health care provider to treat your depression. Financial resources Not all insurance plans cover mental health care, so it is important to check with your insurance carrier. If paying for co-pays or counseling services is a problem, search for a local or county mental health care center. They may be able to offer public mental health care services at low or no cost when you are not able to see a private health care provider. If you are taking medicine for depression, you may be able to get the generic form, which may be less expensive. Some makers of prescription medicines also offer help to patients who cannot afford the medicines they need. Follow these instructions at home:  Get the right amount and quality of sleep.  Cut down on using caffeine, tobacco, alcohol,  and other potentially harmful substances.  Try to exercise, such as walking or lifting small weights.  Take over-the-counter and prescription medicines only as told by your health care provider.  Eat a healthy diet that includes plenty of vegetables, fruits, whole grains, low-fat dairy products, and lean protein. Do not eat a lot of foods that are high in solid fats, added sugars, or salt.  Keep all follow-up visits as told by your health care provider. This is important. Contact a health care provider if:  You stop taking your antidepressant medicines, and you have any of these symptoms: ? Nausea. ? Headache. ? Feeling lightheaded. ? Chills and body aches. ? Not being able to sleep (insomnia).  You or your friends and family think your depression is getting worse. Get help  right away if:  You have thoughts of hurting yourself or others. If you ever feel like you may hurt yourself or others, or have thoughts about taking your own life, get help right away. You can go to your nearest emergency department or call:  Your local emergency services (911 in the [place]  A suicide crisis helpline, such as the [facility] at 813-161-7210. This is open 24-hours a day.  Summary  If you are living with depression, there are ways to help you recover from it and also ways to prevent it from coming back.  Work with your health care team to create a management plan that includes counseling, stress management techniques, and healthy lifestyle habits. This information is not intended to replace advice given to you by your health care provider. Make sure you discuss any questions you have with your health care provider. Document Released: 05/01/2016 Document Revised: 05/01/2016 Document Reviewed: 05/01/2016 Elsevier Interactive Patient Education  2018 [employer] and Stress Management Stress is a normal reaction to life events. It is what you feel when life demands more than you are used to or more than you can handle. Some stress can be useful. For example, the stress reaction can help you catch the last bus of the day, study for a test, or meet a deadline at work. But stress that occurs too often or for too long can cause problems. It can affect your emotional health and interfere with relationships and normal daily activities. Too much stress can weaken your immune system and increase your risk for physical illness. If you already have a medical problem, stress can make it worse. What are the causes? All sorts of life events may cause stress. An event that causes stress for one person may not be stressful for another person. Major life events commonly cause stress. These may be positive or negative. Examples include losing your job,  moving into a new home, getting married, having a baby, or losing a loved one. Less obvious life events may also cause stress, especially if they occur day after day or in combination. Examples include working long hours, driving in traffic, caring for children, being in debt, or being in a difficult relationship. What are the signs or symptoms? Stress may cause emotional symptoms including, the following:  Anxiety. This is feeling worried, afraid, on edge, overwhelmed, or out of control.  Anger. This is feeling irritated or impatient.  Depression. This is feeling sad, down, helpless, or guilty.  Difficulty focusing, remembering, or making decisions.  Stress may cause physical symptoms, including the following:  Aches and pains. These may affect your head, neck, back, stomach, or other areas  of your body.  Tight muscles or clenched jaw.  Low energy or trouble sleeping.  Stress may cause unhealthy behaviors, including the following:  Eating to feel better (overeating) or skipping meals.  Sleeping too little, too much, or both.  Working too much or putting off tasks (procrastination).  Smoking, drinking alcohol, or using drugs to feel better.  How is this diagnosed? Stress is diagnosed through an assessment by your health care provider. Your health care provider will ask questions about your symptoms and any stressful life events.Your health care provider will also ask about your medical history and may order blood tests or other tests. Certain medical conditions and medicine can cause physical symptoms similar to stress. Mental illness can cause emotional symptoms and unhealthy behaviors similar to stress. Your health care provider may refer you to a mental health professional for further evaluation. How is this treated? Stress management is the recommended treatment for stress.The goals of stress management are reducing stressful life events and coping with stress in healthy  ways. Techniques for reducing stressful life events include the following:  Stress identification. Self-monitor for stress and identify what causes stress for you. These skills may help you to avoid some stressful events.  Time management. Set your priorities, keep a calendar of events, and learn to say "no." These tools can help you avoid making too many commitments.  Techniques for coping with stress include the following:  Rethinking the problem. Try to think realistically about stressful events rather than ignoring them or overreacting. Try to find the positives in a stressful situation rather than focusing on the negatives.  Exercise. Physical exercise can release both physical and emotional tension. The key is to find a form of exercise you enjoy and do it regularly.  Relaxation techniques. These relax the body and mind. Examples include yoga, meditation, tai chi, biofeedback, deep breathing, progressive muscle relaxation, listening to music, being out in nature, journaling, and other hobbies. Again, the key is to find one or more that you enjoy and can do regularly.  Healthy lifestyle. Eat a balanced diet, get plenty of sleep, and do not smoke. Avoid using alcohol or drugs to relax.  Strong support network. Spend time with family, friends, or other people you enjoy being around.Express your feelings and talk things over with someone you trust.  Counseling or talktherapy with a mental health professional may be helpful if you are having difficulty managing stress on your own. Medicine is typically not recommended for the treatment of stress.Talk to your health care provider if you think you need medicine for symptoms of stress. Follow these instructions at home:  Keep all follow-up visits as directed by your health care provider.  Take all medicines as directed by your health care provider. Contact a health care provider if:  Your symptoms get worse or you start having new  symptoms.  You feel overwhelmed by your problems and can no longer manage them on your own. Get help right away if:  You feel like hurting yourself or someone else. This information is not intended to replace advice given to you by your health care provider. Make sure you discuss any questions you have with your health care provider. Document Released: 11/22/2000 Document Revised: 11/04/2015 Document Reviewed: 01/21/2013 Elsevier Interactive Patient Education  2017 [employer].

## 2017-06-25 NOTE — Progress Notes (Signed)
Impression and Recommendations:    1. Depression, recurrent ([place])   2. Adjustment disorder with mixed anxiety and depressed mood   3. Type 2 diabetes mellitus with microalbuminuria, with long-term current use of insulin ([facility])     1. DM2:  -Sees [name] of Endo for his diabetes. - Pt's previous A1c is 9.1 from OV on 04-25-17. Pt is compliant with his 2x daily NPH medications. Recommended pt to continue taking this medication as directed and continue keeping a daily blood sugar log. Instructed pt that when taking his humalog, increase his unit intake to 10-15 2x/day with large, high calorie foods. Continue taking 4-6 units of humalog with low calorie, healthy foods. Pt instructed to take his Humalog (10-15 units) before his largest meals, up to 3x a day. Recheck A1c in 2 month because pt sees an endocrinologist regularly. Pt instructed to bring in lab results at this time.    2. Adjustment disorder with mixed anxiety and depressed mood. ( From previous visit on 04-25-17, we stopped Lexapro and started Prozac) .  Cont current dose prozac - Pt declined increasing his Prozac prescription and states he wants to continue taking his current, low-dose prescription.  - Will provide him with a 90 tablet refill and will monitor his mood.  - Follow-up in 2 months to recheck mood. - Pt instructed to call and schedule an appointment sooner if needed. Handouts and information provided.   3. Depression.  - Continue taking your Prozac medications as prescribed below.  Encouraged pt to remain physically active and consider joining a local community program like going to the [employer].  Pt told to continue going to his local church.  Pt stated he bought some golf carts to fix up and has been staying busy working on them.  Pt instructed to continue this type of activity to ensure he remains busy.  Follow-up in 2 months to recheck mood.  Pt instructed to call and schedule an appointment sooner if needed.  Handouts and information provided.    Meds ordered this encounter  Medications  . FLUoxetine (PROZAC) 20 MG tablet    Sig: Take 1 tablet (20 mg total) by mouth daily.    Dispense:  90 tablet    Refill:  1    Gross side effects, risk and benefits, and alternatives of medications and treatment plan in general discussed with patient.  Patient is aware that all medications have potential side effects and we are unable to predict every side effect or drug-drug interaction that may occur.   Patient will call with any questions prior to using medication if they have concerns.  Expresses verbal understanding and consents to current therapy and treatment regimen.  No barriers to understanding were identified.  Red flag symptoms and signs discussed in detail.  Patient expressed understanding regarding what to do in case of emergency\urgent symptoms.  Please see AVS handed out to patient at the end of our visit for further patient instructions/ counseling done pertaining to today's office visit.   Return for 67mo f/up for DM ( keep appt with Endo near future) .    Note: This note was prepared with assistance of Dragon voice recognition software. Occasional wrong-word or sound-a-like substitutions may have occurred due to the inherent limitations of voice recognition software.     This document serves as a record of services personally performed by [name], DO. It was created on her behalf by Mayer Masker, a trained medical scribe.  The creation of this record is based on the scribe's personal observations and the provider's statements to them.   I have reviewed the above medical documentation for accuracy and completeness and I concur.  [name] 06/25/17 12:08  PM   --------------------------------------------------------------------------------------------------------------------------------------------------------------------------------------------------------------------------------------------    Subjective:     HPI: Seth Abbott is a 84 y.o. male who presents to [facility] today for issues as discussed below.  Mood: He states he has not felt well emotionally in the last few weeks since starting Prozac but his mood is recently better in some aspects. He has stiffness and aching in his muscles and bones in his arms/legs recently, and is worried this might be due to his Prozac. He notes this might be due to a recent illness and not being able to be as active as normal. He states he is trying to be more active doing housework and his stiffness improves throughout the day when he is more active.  DM2: Pt is taking his long-acting insulin medication NPH 25 units 2x daily for his insulin. He states he takes Humalog 2x a day but he is taking them at 6 am and 5 pm-- he states his two biggest meals are for breakfast and in dinner. He keeps a blood sugar log (117 this morning- he checks morning and evenings). He states his blood sugar is fairly under control, but he has been having difficulty staying active recently.     Wt Readings from Last 3 Encounters:  06/25/17 174 lb 8 oz (79.2 kg)  04/25/17 174 lb (78.9 kg)  02/08/17 167 lb (75.8 kg)   BP Readings from Last 3 Encounters:  06/25/17 (!) 121/59  04/25/17 140/78  02/08/17 127/61   Pulse Readings from Last 3 Encounters:  06/25/17 70  04/25/17 82  02/08/17 78   BMI Readings from Last 3 Encounters:  06/25/17 28.17 kg/m  04/25/17 28.08 kg/m  02/08/17 26.95 kg/m     Patient Care Team    Relationship Specialty Notifications Start End  [name], DO PCP - General Family Medicine  01/31/17   Sueanne Margarita, MD PCP - Cardiology Cardiology  10/12/13     Comment: Santa Lighter, MD Consulting Physician Endocrinology  01/31/17      Patient Active Problem List   Diagnosis Date Noted  . Diabetic nephropathy with proteinuria ([place]) 01/31/2017    Priority: High  . Combined hyperlipidemia associated with type 2 diabetes mellitus ([place]) 01/31/2017    Priority: High  . Type II Diabetes mellitus with microalbuminuria, with long-term current use of insulin ([facility])     Priority: High  . Hypertension associated with diabetes ([place])     Priority: High  . S/P coronary artery stent placement     Priority: Medium  . grade I Diastolic heart failure secondary to coronary artery disease ([place]) 02/09/2015    Priority: Medium  . h/o TIA due to embolism ([place])- 10-15 yrs ago     Priority: Medium  . Depression, recurrent ([place]) 06/25/2017  . Vitamin D insufficiency 02/08/2017  . Adjustment disorder with mixed anxiety and depressed mood 02/08/2017  . Arthritis 02/04/2017  . Sepsis ([place]) 01/03/2017  . Acute metabolic encephalopathy 97/67/3419  . Hyponatremia 01/03/2017  . Bronchitis 01/03/2017  . SOB (shortness of breath) 08/16/2015  . Abnormal PFTs (pulmonary function tests) 08/16/2015  . Coronary artery calcification seen on CAT scan   . Aortic stenosis, mild   . Dilated aortic root ([place])   .  Prostatitis   . Shingles     Past Medical history, Surgical history, Family history, Social history, Allergies and Medications have been entered into the medical record, reviewed and changed as needed.    Current Meds  Medication Sig  . ACCU-CHEK AVIVA PLUS test strip USE TO CHECK BLOOD SUGAR 3 TIMES DAILY  . aspirin 81 MG tablet Take 81 mg by mouth every morning.   [name] atorvastatin (LIPITOR) 20 MG tablet Take 20 mg by mouth every morning.   . Cholecalciferol (VITAMIN D3) 5000 units TABS 5,000 IU OTC vitamin D3 daily.  . clopidogrel (PLAVIX) 75 MG tablet Take 1 tablet (75 mg total) by mouth daily.  [name] FLUoxetine (PROZAC) 20 MG tablet Take 1 tablet (20 mg  total) by mouth daily.  . insulin lispro (HUMALOG) 100 UNIT/ML cartridge Inject 10-15 Units into the skin 2 (two) times daily. Pt ranges his insulin with his blood sugar readings  . insulin NPH Human (HUMULIN N,NOVOLIN N) 100 UNIT/ML injection Inject 25 Units into the skin 2 (two) times daily before a meal.   . isosorbide mononitrate (IMDUR) 30 MG 24 hr tablet TAKE 1 TABLET BY MOUTH EVERY DAY  . Melatonin 3 MG TABS Take 3 mg by mouth at bedtime as needed.  . metoprolol succinate (TOPROL-XL) 25 MG 24 hr tablet TAKE 1 TABLET BY MOUTH DAILY . TAKE WITH OR IMMEDIATELY FOLLOWING A MEAL  . Multiple Vitamins-Minerals (MULTIVITAMIN WITH MINERALS) tablet Take 1 tablet by mouth daily.  . nitroGLYCERIN (NITROSTAT) 0.4 MG SL tablet Place 1 tablet (0.4 mg total) under the tongue every 5 (five) minutes as needed for chest pain (up to 3 doses).  . pantoprazole (PROTONIX) 20 MG tablet TAKE 1 TABLET BY MOUTH EVERY DAY  . [DISCONTINUED] FLUoxetine (PROZAC) 20 MG tablet Take 1 tablet (20 mg total) daily by mouth.    Allergies:  No Known Allergies   Review of Systems:  A fourteen system review of systems was performed and found to be positive as per HPI.   Objective:   Blood pressure (!) 121/59, pulse 70, height 5\' 6"  (1.676 m), weight 174 lb 8 oz (79.2 kg), SpO2 98 %. Body mass index is 28.17 kg/m. General:  Well Developed, well nourished, appropriate for stated age.  Neuro:  Alert and oriented,  extra-ocular muscles intact  HEENT:  Normocephalic, atraumatic, neck supple, no carotid bruits appreciated  Skin:  no gross rash, warm, pink. Cardiac:  RRR, S1 S2. L Systolic ejection murmur in left upper sternal border Respiratory:  ECTA B/L and A/P, Not using accessory muscles, speaking in full sentences- unlabored. Lung sounds clear to auscultation. Vascular:  Ext warm, no cyanosis apprec.; cap RF less 2 sec. No edema in bilateral extremities. Psych:  No HI/SI, judgement and insight good, Euthymic mood. Full  Affect.

## 2017-06-26 ENCOUNTER — Ambulatory Visit: Payer: Medicare Other | Admitting: Family Medicine

## 2017-06-28 ENCOUNTER — Encounter: Payer: Self-pay | Admitting: Cardiology

## 2017-06-28 DIAGNOSIS — E785 Hyperlipidemia, unspecified: Secondary | ICD-10-CM | POA: Insufficient documentation

## 2017-06-28 DIAGNOSIS — I2510 Atherosclerotic heart disease of native coronary artery without angina pectoris: Secondary | ICD-10-CM | POA: Insufficient documentation

## 2017-06-28 NOTE — Progress Notes (Deleted)
Cardiology Office Note:    Date:  06/28/2017   ID:  Seth Abbott, DOB [birth year], MRN [id number]  PCP:  [name], DO  Cardiologist:  Fransico Him, MD    Referring MD: [name], DO   No chief complaint on file.   History of Present Illness:    Seth Abbott is a 84 y.o. male with a hx of CAD with heavily calcified RCA with proximal eccentric 95% stenosis, diffuse calcified proximal to distal LAD disease up to 70% at multiple spots,s/p PCI of the RCA and rotational atherectomy/DES to prox RCA 02/09/15. His LAD disease is being treated medically.  Repeat cath 01/04/2017 showed 70-80% prox to mid LAD stenosis with mild progression since 2016, patent LCx, patent RCA stent and 50-60% mid to distal RCA with normal LVF and normal LVEDP.  Medical management was recommended unless he had refractory angina. He also has a h/o HTN, HLD, DM, TIA and mild AS by echo 12/2013.   He is here today for followup and is doing well.  He denies any chest pain or pressure, SOB, DOE, PND, orthopnea, LE edema, dizziness, palpitations or syncope. He is compliant with hi meds and is tolerating meds with no SE.     Past Medical History:  Diagnosis Date  . Aortic stenosis, mild    by echo 12/2016  . Arthritis    "mostly in my hands" (02/09/2015)  . CAD (coronary artery disease)    a. 12/2014 cath showed 95% prox RCA and diffuse calcified prox to distal LAD disease up to 70% at multiple spots. S/p rotational atherectomy/DES to prox RCA 02/09/15. Repeat cath 01/04/2017 showed 70-80% prox to mid LAD stenosis with mild progression since 2016, patent LCx, patent RCA stent and 50-60% mid to distal RCA with normal LVF and normal LVEDP.  Medical management was recommended unless he ha  . Dilated aortic root ([place])    84mm by echo 12/2016  . GERD (gastroesophageal reflux disease)   . HTN (hypertension)   . Hyperlipidemia   . Prostatitis   . Shingles   . TIA (transient ischemic attack) early 1990's; 09/2006   [name] 10/26/2010  . Type II diabetes mellitus ([place])     Past Surgical History:  Procedure Laterality Date  . CARDIAC CATHETERIZATION N/A 12/11/2014   Procedure: Left Heart Cath and Coronary Angiography;  Surgeon: Belva Crome, MD;  Location: [facility];  Service: Cardiovascular;  Laterality: N/A;  . CARDIAC CATHETERIZATION N/A 02/09/2015   Procedure: Coronary Stent Intervention-Rotoblator, temp pacer;  Surgeon: Belva Crome, MD;  Location: [place] [facility];  Service: Cardiovascular;  Laterality: N/A;  . CATARACT EXTRACTION W/ INTRAOCULAR LENS  IMPLANT, BILATERAL Bilateral   . EXCISIONAL HEMORRHOIDECTOMY  1957  . EYE SURGERY    . LEFT HEART CATH AND CORONARY ANGIOGRAPHY N/A 01/04/2017   Procedure: Left Heart Cath and Coronary Angiography;  Surgeon: Belva Crome, MD;  Location: [place] [facility];  Service: Cardiovascular;  Laterality: N/A;  . MEMBRANE PEEL Left 01/11/2015   eye    Current Medications: No outpatient medications have been marked as taking for the 06/29/17 encounter (Appointment) with Sueanne Margarita, MD.     Allergies:   Patient has no known allergies.   Social History   Socioeconomic History  . Marital status: Widowed    Spouse name: Not on file  . Number of children: Not on file  . Years of education: Not on file  . Highest education level: Not  on file  Social Needs  . Financial resource strain: Not on file  . Food insecurity - worry: Not on file  . Food insecurity - inability: Not on file  . Transportation needs - medical: Not on file  . Transportation needs - non-medical: Not on file  Occupational History  . Occupation: retired  Tobacco Use  . Smoking status: Former Smoker    Types: Cigarettes  . Smokeless tobacco: Never Used  . Tobacco comment: "haven't smoked since I was 15" tried it once  Substance and Sexual Activity  . Alcohol use: Yes    Alcohol/week: 0.0 oz    Comment: 02/09/2015 "might have a sip of wne a dozen times/yr"  . Drug use:  No  . Sexual activity: Not Currently  Other Topics Concern  . Not on file  Social History Narrative  . Not on file     Family History: The patient's family history includes CAD in his father; COPD in his brother; Diabetes in his father, mother, sister, and sister; Heart attack in his father.  ROS:   Please see the history of present illness.    ROS  All other systems reviewed and negative.   EKGs/Labs/Other Studies Reviewed:    The following studies were reviewed today: none  EKG:  EKG is not ordered today.    Recent Labs: 01/02/2017: B Natriuretic Peptide 19.8 02/02/2017: BUN 11; Creatinine, Ser 0.84; Hemoglobin 12.9; Magnesium 1.9; Platelets 215; Potassium 4.6; Sodium 138; TSH 2.940 05/14/2017: ALT 17   Recent Lipid Panel    Component Value Date/Time   CHOL 126 05/14/2017 0856   TRIG 48 05/14/2017 0856   HDL 62 05/14/2017 0856   CHOLHDL 2.0 05/14/2017 0856   CHOLHDL 2.4 03/17/2016 0847   VLDL 14 03/17/2016 0847   LDLCALC 54 05/14/2017 0856    Physical Exam:    VS:  There were no vitals taken for this visit.    Wt Readings from Last 3 Encounters:  06/25/17 174 lb 8 oz (79.2 kg)  04/25/17 174 lb (78.9 kg)  02/08/17 167 lb (75.8 kg)     GEN:  Well nourished, well developed in no acute distress HEENT: Normal NECK: No JVD; No carotid bruits LYMPHATICS: No lymphadenopathy CARDIAC: RRR, no murmurs, rubs, gallops RESPIRATORY:  Clear to auscultation without rales, wheezing or rhonchi  ABDOMEN: Soft, non-tender, non-distended MUSCULOSKELETAL:  No edema; No deformity  SKIN: Warm and dry NEUROLOGIC:  Alert and oriented x 3 PSYCHIATRIC:  Normal affect   ASSESSMENT:    1. Aortic stenosis, mild   2. Coronary artery disease involving native coronary artery of native heart without angina pectoris   3. Hypertension associated with diabetes ([place])   4. Hyperlipidemia LDL goal <70   5. Dilated aortic root (HCC)    PLAN:    In order of problems listed above:  1.   Mild AS - very mild on echo with mean AVG 64mmHg on echo 12/2016.    2.  ASCAD - cath 02/09/2015 with heavily calcified RCA with proximal eccentric 95% stenosis, diffuse calcified proximal to distal LAD disease up to 70% at multiple spots,s/p PCI of the RCA and rotational atherectomy/DES to prox RCA 02/09/15. Repeat cath 01/04/2017 showed 70-80% prox to mid LAD stenosis with mild progression since 2016, patent LCx, patent RCA stent and 50-60% mid to distal RCA with normal LVF and normal LVEDP.  Medical management was recommended unless he had refractory angina. He has not had any anginal symptoms recently. He wil continue on  Imdur 30mg  daily, Toprol XL 25mg  daily, Plavix 75mg  daily and ASA 81mg  daily as well as statin.   3.  HTN - Bp is well controlled one exam today.  He will continue on Toprol XL 25mg  daily.  4.  Hyperlipidemia with LDL goal < 70.  His LDL was 54 last month.  He will continue on atorvastatin 20mg  daily.    5.  Mildly dilated aortic root at 70mm by echo 12/2016. Will repeat echo 12/2017.    Medication Adjustments/Labs and Tests Ordered: Current medicines are reviewed at length with the patient today.  Concerns regarding medicines are outlined above.  No orders of the defined types were placed in this encounter.  No orders of the defined types were placed in this encounter.   Signed, Fransico Him, MD  06/28/2017 10:46 AM    [place]

## 2017-06-29 ENCOUNTER — Ambulatory Visit: Payer: Medicare Other | Admitting: Cardiology

## 2017-07-30 DIAGNOSIS — R946 Abnormal results of thyroid function studies: Secondary | ICD-10-CM | POA: Diagnosis not present

## 2017-07-30 DIAGNOSIS — I10 Essential (primary) hypertension: Secondary | ICD-10-CM | POA: Diagnosis not present

## 2017-07-30 DIAGNOSIS — E1165 Type 2 diabetes mellitus with hyperglycemia: Secondary | ICD-10-CM | POA: Diagnosis not present

## 2017-07-30 DIAGNOSIS — Z794 Long term (current) use of insulin: Secondary | ICD-10-CM | POA: Diagnosis not present

## 2017-07-30 LAB — LIPID PANEL
CHOLESTEROL: 111 (ref 0–200)
HDL: 44 (ref 35–70)
LDL CALC: 54
Triglycerides: 68 (ref 40–160)

## 2017-07-30 LAB — MICROALBUMIN, URINE: MICROALBUMIN (UR) POC: 8.4 mg/L

## 2017-07-30 LAB — BASIC METABOLIC PANEL: Glucose: 177

## 2017-07-30 LAB — HEMOGLOBIN A1C
Hemoglobin A1C: 7.8
Hemoglobin A1C: 7.8

## 2017-07-30 LAB — TSH: TSH: 5.27 (ref 0.41–5.90)

## 2017-08-02 ENCOUNTER — Encounter: Payer: Self-pay | Admitting: Cardiology

## 2017-08-23 ENCOUNTER — Ambulatory Visit (INDEPENDENT_AMBULATORY_CARE_PROVIDER_SITE_OTHER): Payer: Medicare Other | Admitting: Family Medicine

## 2017-08-23 ENCOUNTER — Encounter: Payer: Self-pay | Admitting: Family Medicine

## 2017-08-23 VITALS — BP 137/68 | HR 64 | Ht 66.0 in | Wt 178.0 lb

## 2017-08-23 DIAGNOSIS — E782 Mixed hyperlipidemia: Secondary | ICD-10-CM

## 2017-08-23 DIAGNOSIS — F339 Major depressive disorder, recurrent, unspecified: Secondary | ICD-10-CM

## 2017-08-23 DIAGNOSIS — R809 Proteinuria, unspecified: Secondary | ICD-10-CM

## 2017-08-23 DIAGNOSIS — I77810 Thoracic aortic ectasia: Secondary | ICD-10-CM

## 2017-08-23 DIAGNOSIS — E1169 Type 2 diabetes mellitus with other specified complication: Secondary | ICD-10-CM

## 2017-08-23 DIAGNOSIS — E1129 Type 2 diabetes mellitus with other diabetic kidney complication: Secondary | ICD-10-CM | POA: Insufficient documentation

## 2017-08-23 DIAGNOSIS — Z794 Long term (current) use of insulin: Secondary | ICD-10-CM

## 2017-08-23 DIAGNOSIS — I10 Essential (primary) hypertension: Secondary | ICD-10-CM | POA: Diagnosis not present

## 2017-08-23 DIAGNOSIS — E1159 Type 2 diabetes mellitus with other circulatory complications: Secondary | ICD-10-CM

## 2017-08-23 DIAGNOSIS — E1121 Type 2 diabetes mellitus with diabetic nephropathy: Secondary | ICD-10-CM | POA: Diagnosis not present

## 2017-08-23 NOTE — Progress Notes (Signed)
Impression and Recommendations:    1. Depression, recurrent ([place])   2. Hypertension associated with diabetes ([place])   3. Type 2 diabetes mellitus with microalbuminuria, with long-term current use of insulin ([place])   4. Diabetic nephropathy with proteinuria ([place])   5. Combined hyperlipidemia associated with type 2 diabetes mellitus (HCC)   6. Persistent microalbuminuria associated with type 2 diabetes mellitus ([place])   7. Dilated aortic root ([place])      1. Depression, current- -Per pt, he reduced his prozac to 0.5 tablets QD but feels no change after making this change himself. -continue this dose. -instructed pt to go back to 1 full tablet if he feels especially depressed or low in his mood.  -walk down your driveway to your mailbox 4-5 times a day for your daily exercise  2. HTN  -BP well controlled at this time. Sx stable. Pt tolerating meds well without complication. Continue meds as listed below.   3. DM2:  -A1c went from 9.1 on 04-25-17 to 7.8 on 07-30-17 at [name], endocrinology. Pt is followed closely by [name]. -He is compliant with his medications and tolerating them well.  -continue DM meds, improved from prior, follow with endocrinology for med management Continue taking humalog with meals. -Keep checking your blood sugars and bring a log into next OV.   4. urine microalbuminuria- obtained in 01-2017 30-300, will continue to monitor yearly. Control blood sugars to goal A1c <8.0  5. Combined HLD with DM2 -cholesterol is at goal. Last lipid panel was 05-14-17 with all results WNL. -continue blood thinner meds per cardiology. -continue cholesterol meds as listed below.  6. Nephropathy- -will continue to monitor GFR and serum creatinine- has been stable.  7. dilated aortic root- murmur present and stable. Pt asymptomatic.   -discussed the various spokes of the wheel that contribute to a positive mood, including diet/exercise, counseling, medicines etc.    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     Education and routine counseling performed. Handouts provided.  Return for 4-14mo .   The patient was counseled, risk factors were discussed, anticipatory guidance given.  Gross side effects, risk and benefits, and alternatives of medications discussed with patient.  Patient is aware that all medications have potential side effects and we are unable to predict every side effect or drug-drug interaction that may occur.  Expresses verbal understanding and consents to current therapy plan and treatment regimen.  Please see AVS handed out to patient at the end of our visit for further patient instructions/ counseling done pertaining to today's office visit.    Note: This document was prepared using Dragon voice recognition software and may include unintentional dictation errors.  This document serves as a record of services personally performed by [name], DO. It was created on her behalf by Mayer Masker, a trained medical scribe. The creation of this record is based on the scribe's personal observations and the provider's statements to them.   I have reviewed the above medical documentation for accuracy and completeness and I concur.  [name] 08/24/17 1:18 PM   Subjective:    Chief Complaint  Patient presents with  . Follow-up   Seth Abbott is a 84 y.o. male who presents to [place] at [facility] today for Diabetes Management and mood.  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   Mood: He has not been that active recently. Sleep is improving as well. He has not spoken to a counselor or a pastor yet. He denies SI and HI.   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  Pt states after starting his prozac, he has had  leg swelling. As a result, he lowered his dose to 0.5 tablets daily, which improved his leg swelling. Depression screen [facility] 2/9 08/23/2017 06/25/2017 06/25/2017 04/25/2017 01/31/2017  Decreased Interest 0 1 1 2 2   Down, Depressed, Hopeless 0 0 0 2 1  PHQ - 2 Score 0 1 1 4 3   Altered sleeping 0 1 - 1 1  Tired, decreased energy 1 1 - 2 1  Change in appetite 0 0 - 0 0  Feeling bad or failure about yourself  0 0 - 0 1  Trouble concentrating 0 0 - 0 1  Moving slowly or fidgety/restless 0 0 - 0 0  Suicidal thoughts 0 0 - 0 1  PHQ-9 Score 1 3 - 7 8  Difficult doing work/chores Not difficult at all Somewhat difficult - - Somewhat difficult   GAD 7 : Generalized Anxiety Score 08/23/2017 06/25/2017 01/31/2017  Nervous, Anxious, on Edge 0 0 1  Control/stop worrying 0 0 0  Worry too much - different things 0 0 1  Trouble relaxing 0 0 1  Restless 0 0 0  Easily annoyed or irritable 0 0 1  Afraid - awful might happen 0 0 0  Total GAD 7 Score 0 0 4  Anxiety Difficulty Not difficult at all Not difficult at all Somewhat difficult    DM HPI: A1c went from 9.1 on 04-25-17 to 7.8 -  He has been working on diet and exercise for diabetes  Pt is currently maintained on the following medications for diabetes:   see med list today Medication compliance - yes.  Home glucose readings range: 99 fasting.  He has had a few lows before bed, but he states this is because he overate and took extra insulin as a result. His blood sugar was around 66 when he felt this. After this, he drank juice or will eat a cookie to raise it. He states this is likely because he took extra insulin.    Denies polyuria/polydipsia. Denies hypo/ hyperglycemia symptoms - He denies new onset of: chest pain, exercise intolerance, shortness of breath, dizziness, visual changes, headache, lower extremity claudication.   He has leg swelling which he believes this is from his prozac. As a result, he has been taking 0.5 tablets of his  prozac.  Last diabetic eye exam was No results found for: [id number]  Foot exam- UTD  Last A1C in the office was:  Lab Results  Component Value Date   HGBA1C 9.1 04/25/2017   HGBA1C 8.7 (H) 02/02/2017   HGBA1C 8.3 11/28/2016    Lab Results  Component Value Date   MICROALBUR 80 02/01/2017   LDLCALC 54 05/14/2017   CREATININE 0.84 02/02/2017   Last 3 blood pressure readings in our office are as follows: BP Readings from Last 3 Encounters:  08/23/17 137/68  06/25/17 (!) 121/59  04/25/17 140/78    BMI Readings from Last 3 Encounters:  08/23/17 28.73 kg/m  06/25/17 28.17 kg/m  04/25/17 28.08 kg/m     Patient Care Team    Relationship Specialty Notifications Start End  [name], DO PCP -  General Family Medicine  01/31/17   Sueanne Margarita, MD PCP - Cardiology Cardiology  10/12/13    Comment: Santa Lighter, MD Consulting Physician Endocrinology  01/31/17      Patient Active Problem List   Diagnosis Date Noted  . Persistent microalbuminuria associated with type 2 diabetes mellitus ([place]) 08/23/2017    Priority: High  . Depression, recurrent ([place]) 06/25/2017    Priority: High  . Diabetic nephropathy with proteinuria ([place]) 01/31/2017    Priority: High  . Combined hyperlipidemia associated with type 2 diabetes mellitus ([place]) 01/31/2017    Priority: High  . Type II Diabetes mellitus with microalbuminuria, with long-term current use of insulin (HCC)     Priority: High  . Hypertension associated with diabetes ([place])     Priority: High  . S/P coronary artery stent placement     Priority: Medium  . h/o TIA due to embolism ([place])- 10-15 yrs ago     Priority: Medium  . Hyperlipidemia LDL goal <70 06/28/2017    Priority: Low  . Vitamin D insufficiency 02/08/2017    Priority: Low  . Aortic stenosis, mild     Priority: Low  . CAD (coronary artery disease), native coronary artery 06/28/2017  . Adjustment disorder with mixed anxiety and depressed mood  02/08/2017  . Arthritis 02/04/2017  . Sepsis ([place]) 01/03/2017  . Acute metabolic encephalopathy 61/44/3154  . Hyponatremia 01/03/2017  . Bronchitis 01/03/2017  . SOB (shortness of breath) 08/16/2015  . Abnormal PFTs (pulmonary function tests) 08/16/2015  . Dilated aortic root ([place])   . Prostatitis   . Shingles      Past Medical History:  Diagnosis Date  . Aortic stenosis, mild    by echo 12/2016  . Arthritis    "mostly in my hands" (02/09/2015)  . CAD (coronary artery disease)    a. 12/2014 cath showed 95% prox RCA and diffuse calcified prox to distal LAD disease up to 70% at multiple spots. S/p rotational atherectomy/DES to prox RCA 02/09/15. Repeat cath 01/04/2017 showed 70-80% prox to mid LAD stenosis with mild progression since 2016, patent LCx, patent RCA stent and 50-60% mid to distal RCA with normal LVF and normal LVEDP.  Medical management was recommended unless he ha  . Dilated aortic root ([place])    64mm by echo 12/2016  . GERD (gastroesophageal reflux disease)   . HTN (hypertension)   . Hyperlipidemia   . Prostatitis   . Shingles   . TIA (transient ischemic attack) early 1990's; 09/2006   [name] 10/26/2010  . Type II diabetes mellitus ([place])      Past Surgical History:  Procedure Laterality Date  . CARDIAC CATHETERIZATION N/A 12/11/2014   Procedure: Left Heart Cath and Coronary Angiography;  Surgeon: Belva Crome, MD;  Location: [place] [facility];  Service: Cardiovascular;  Laterality: N/A;  . CARDIAC CATHETERIZATION N/A 02/09/2015   Procedure: Coronary Stent Intervention-Rotoblator, temp pacer;  Surgeon: Belva Crome, MD;  Location: [place] [facility];  Service: Cardiovascular;  Laterality: N/A;  . CATARACT EXTRACTION W/ INTRAOCULAR LENS  IMPLANT, BILATERAL Bilateral   . EXCISIONAL HEMORRHOIDECTOMY  1957  . EYE SURGERY    . LEFT HEART CATH AND CORONARY ANGIOGRAPHY N/A 01/04/2017   Procedure: Left Heart Cath and Coronary Angiography;  Surgeon: Belva Crome, MD;   Location: [place] [facility];  Service: Cardiovascular;  Laterality: N/A;  . MEMBRANE PEEL Left 01/11/2015   eye     Family History  Problem Relation Age  of Onset  . Diabetes Mother   . Heart attack Father   . Diabetes Father   . CAD Father   . Diabetes Sister   . COPD Brother   . Diabetes Sister      Social History   Substance and Sexual Activity  Drug Use No  ,  Social History   Substance and Sexual Activity  Alcohol Use Yes  . Alcohol/week: 0.0 oz   Comment: 02/09/2015 "might have a sip of wne a dozen times/yr"  ,  Social History   Tobacco Use  Smoking Status Former Smoker  . Types: Cigarettes  Smokeless Tobacco Never Used  Tobacco Comment   "haven't smoked since I was 15" tried it once  ,    Current Outpatient Medications on File Prior to Visit  Medication Sig Dispense Refill  . ACCU-CHEK AVIVA PLUS test strip USE TO CHECK BLOOD SUGAR 3 TIMES DAILY  11  . aspirin 81 MG tablet Take 81 mg by mouth every morning.     [name] atorvastatin (LIPITOR) 20 MG tablet Take 20 mg by mouth every morning.     . Cholecalciferol (VITAMIN D3) 5000 units TABS 5,000 IU OTC vitamin D3 daily. 90 tablet 3  . clopidogrel (PLAVIX) 75 MG tablet Take 1 tablet (75 mg total) by mouth daily. 90 tablet 3  . FLUoxetine (PROZAC) 20 MG tablet Take 1 tablet (20 mg total) by mouth daily. 90 tablet 1  . insulin lispro (HUMALOG) 100 UNIT/ML cartridge Inject 10-15 Units into the skin 2 (two) times daily. Pt ranges his insulin with his blood sugar readings    . insulin NPH Human (HUMULIN N,NOVOLIN N) 100 UNIT/ML injection Inject 25 Units into the skin 2 (two) times daily before a meal.     . isosorbide mononitrate (IMDUR) 30 MG 24 hr tablet TAKE 1 TABLET BY MOUTH EVERY DAY 30 tablet 5  . Melatonin 3 MG TABS Take 3 mg by mouth at bedtime as needed.    . metoprolol succinate (TOPROL-XL) 25 MG 24 hr tablet TAKE 1 TABLET BY MOUTH DAILY . TAKE WITH OR IMMEDIATELY FOLLOWING A MEAL 30 tablet 11  . Multiple  Vitamins-Minerals (MULTIVITAMIN WITH MINERALS) tablet Take 1 tablet by mouth daily.    . nitroGLYCERIN (NITROSTAT) 0.4 MG SL tablet Place 1 tablet (0.4 mg total) under the tongue every 5 (five) minutes as needed for chest pain (up to 3 doses). 25 tablet 3  . pantoprazole (PROTONIX) 20 MG tablet TAKE 1 TABLET BY MOUTH EVERY DAY 90 tablet 2   No current facility-administered medications on file prior to visit.      No Known Allergies   Review of Systems:   General:  Denies fever, chills Optho/Auditory:   Denies visual changes, blurred vision Respiratory:   Denies SOB, cough, wheeze, DIB  Cardiovascular:   Denies chest pain, palpitations, painful respirations Gastrointestinal:   Denies nausea, vomiting, diarrhea.  Endocrine:     Denies new hot or cold intolerance Musculoskeletal:  Denies joint swelling, gait issues, or new unexplained myalgias/ arthralgias Skin:  Denies rash, suspicious lesions  Neurological:    Denies dizziness, unexplained weakness, numbness  Psychiatric/Behavioral:   Denies mood changes    Objective:     Blood pressure 137/68, pulse 64, height 5\' 6"  (1.676 m), weight 178 lb (80.7 kg), SpO2 97 %.  Body mass index is 28.73 kg/m.  General: Well Developed, well nourished, and in no acute distress.  HEENT: Normocephalic, atraumatic, pupils equal round reactive to light, neck  supple, No carotid bruits, no JVD Skin: Warm and dry, cap RF less 2 sec Cardiac: Regular rate and rhythm, S1, S2 WNL's, no rubs or gallops. Holosystolic murmur L 2nd intercostal space  Respiratory: ECTA B/L, Not using accessory muscles, speaking in full sentences. NeuroM-Sk: Ambulates w/o assistance, moves ext * 4 w/o difficulty, sensation grossly intact.  Ext: scant edema b/l lower ext Psych: No HI/SI, judgement and insight good, Euthymic mood. Full Affect.

## 2017-08-23 NOTE — Patient Instructions (Signed)

## 2017-09-09 NOTE — Progress Notes (Signed)
Cardiology Office Note:    Date:  09/10/2017   ID:  Seth Abbott, DOB 1927-10-31, MRN [id number]  PCP:  [name], DO  Cardiologist:  Fransico Him, MD    Referring MD: [name], DO   Chief Complaint  Patient presents with  . Coronary Artery Disease  . Hypertension  . Hyperlipidemia    History of Present Illness:    Seth Abbott is a 84 y.o. male with a hx of ASCAD with cath 12/2014 showing 95% proximal RCA and diffusely calcified proximal to distal LAD up to 70% status post rotational atherectomy with DES to the proximal RCA on 02/09/2015.  Repeat cath done for exertional dyspnea and chest pain 01/04/2017 showed 70-80% proximal to mid LAD stenosis with mild progression, patent left circumflex and patent RCA stent with 50-60% mid to distal RCA.  Medical therapy was recommended due to the degree of heavy calcification of the LAD which require athero-ablation either with rotational atherectomy or orbital atherectomy followed by a very long stented segment with a crossover of a very large diagonal branch.  It was felt that PCI should be  reserved for unstable angina or acute coronary syndrome only.  He has done well since then with no significant angina.  He also has a history of dilated aortic root at 41 mm by echo in July 2018, GERD, hypertension, type 2 diabetes mellitus and hyperlipidemia.   He is here today for followup and is doing well.  He denies any chest pain or pressure, PND, orthopnea,  dizziness, palpitations or syncope. He has chronic DOE with walking but it is not bothersome.  He says that he can walk about 10 -15 minutes and then stops to rest.  He has occasional RLE swelling but not a lot.  He is compliant with his meds and is tolerating meds with no SE.     Past Medical History:  Diagnosis Date  . Aortic stenosis, mild    by echo 12/2016  . Arthritis    "mostly in my hands" (02/09/2015)  . CAD (coronary artery disease)    a. 12/2014 cath showed 95% prox RCA  and diffuse calcified prox to distal LAD disease up to 70% at multiple spots. S/p rotational atherectomy/DES to prox RCA 02/09/15. Repeat cath 01/04/2017 showed 70-80% prox to mid LAD stenosis with mild progression since 2016, patent LCx, patent RCA stent and 50-60% mid to distal RCA with normal LVF and normal LVEDP.  Medical management was recommended unless he ha  . Dilated aortic root ([place]    86mm by echo 12/2016  . GERD (gastroesophageal reflux disease)   . HTN (hypertension)   . Hyperlipidemia   . Prostatitis   . Shingles   . TIA (transient ischemic attack) early 1990's; 09/2006   Seth Abbott 10/26/2010  . Type II diabetes mellitus ([place])     Past Surgical History:  Procedure Laterality Date  . CARDIAC CATHETERIZATION N/A 12/11/2014   Procedure: Left Heart Cath and Coronary Angiography;  Surgeon: Belva Crome, MD;  Location: [facility];  Service: Cardiovascular;  Laterality: N/A;  . CARDIAC CATHETERIZATION N/A 02/09/2015   Procedure: Coronary Stent Intervention-Rotoblator, temp pacer;  Surgeon: Belva Crome, MD;  Location: [facility];  Service: Cardiovascular;  Laterality: N/A;  . CATARACT EXTRACTION W/ INTRAOCULAR LENS  IMPLANT, BILATERAL Bilateral   . EXCISIONAL HEMORRHOIDECTOMY  1957  . EYE SURGERY    . LEFT HEART CATH AND CORONARY ANGIOGRAPHY N/A 01/04/2017   Procedure: Left  Heart Cath and Coronary Angiography;  Surgeon: Belva Crome, MD;  Location: [facility];  Service: Cardiovascular;  Laterality: N/A;  . MEMBRANE PEEL Left 01/11/2015   eye    Current Medications: Current Meds  Medication Sig  . ACCU-CHEK AVIVA PLUS test strip USE TO CHECK BLOOD SUGAR 3 TIMES DAILY  . aspirin 81 MG tablet Take 81 mg by mouth every morning.   [name] atorvastatin (LIPITOR) 20 MG tablet Take 20 mg by mouth every morning.   . Cholecalciferol (VITAMIN D3) 5000 units TABS 5,000 IU OTC vitamin D3 daily.  . clopidogrel (PLAVIX) 75 MG tablet Take 1 tablet (75 mg total) by mouth daily.  [name]  FLUoxetine (PROZAC) 20 MG tablet Take 1 tablet (20 mg total) by mouth daily.  . insulin lispro (HUMALOG) 100 UNIT/ML cartridge Inject 10-15 Units into the skin 2 (two) times daily. Pt ranges his insulin with his blood sugar readings  . insulin NPH Human (HUMULIN N,NOVOLIN N) 100 UNIT/ML injection Inject 25 Units into the skin 2 (two) times daily before a meal.   . isosorbide mononitrate (IMDUR) 30 MG 24 hr tablet TAKE 1 TABLET BY MOUTH EVERY DAY  . metoprolol succinate (TOPROL-XL) 25 MG 24 hr tablet TAKE 1 TABLET BY MOUTH DAILY . TAKE WITH OR IMMEDIATELY FOLLOWING A MEAL  . Multiple Vitamins-Minerals (MULTIVITAMIN WITH MINERALS) tablet Take 1 tablet by mouth daily.  . nitroGLYCERIN (NITROSTAT) 0.4 MG SL tablet Place 1 tablet (0.4 mg total) under the tongue every 5 (five) minutes as needed for chest pain (up to 3 doses).  . pantoprazole (PROTONIX) 20 MG tablet TAKE 1 TABLET BY MOUTH EVERY DAY     Allergies:   Patient has no known allergies.   Social History   Socioeconomic History  . Marital status: Widowed    Spouse name: Not on file  . Number of children: Not on file  . Years of education: Not on file  . Highest education level: Not on file  Occupational History  . Occupation: retired  Scientific laboratory technician  . Financial resource strain: Not on file  . Food insecurity:    Worry: Not on file    Inability: Not on file  . Transportation needs:    Medical: Not on file    Non-medical: Not on file  Tobacco Use  . Smoking status: Former Smoker    Types: Cigarettes  . Smokeless tobacco: Never Used  . Tobacco comment: "haven't smoked since I was 15" tried it once  Substance and Sexual Activity  . Alcohol use: Yes    Alcohol/week: 0.0 oz    Comment: 02/09/2015 "might have a sip of wne a dozen times/yr"  . Drug use: No  . Sexual activity: Not Currently  Lifestyle  . Physical activity:    Days per week: Not on file    Minutes per session: Not on file  . Stress: Not on file  Relationships  .  Social connections:    Talks on phone: Not on file    Gets together: Not on file    Attends religious service: Not on file    Active member of club or organization: Not on file    Attends meetings of clubs or organizations: Not on file    Relationship status: Not on file  Other Topics Concern  . Not on file  Social History Narrative  . Not on file     Family History: The patient's family history includes CAD in his father; COPD in his  brother; Diabetes in his father, mother, sister, and sister; Heart attack in his father.  ROS:   Please see the history of present illness.    ROS  All other systems reviewed and negative.   EKGs/Labs/Other Studies Reviewed:    The following studies were reviewed today: Cath from 12/2016  EKG:  EKG is not ordered today.   Recent Labs: 01/02/2017: B Natriuretic Peptide 19.8 02/02/2017: BUN 11; Creatinine, Ser 0.84; Hemoglobin 12.9; Magnesium 1.9; Platelets 215; Potassium 4.6; Sodium 138; TSH 2.940 05/14/2017: ALT 17   Recent Lipid Panel    Component Value Date/Time   CHOL 126 05/14/2017 0856   TRIG 48 05/14/2017 0856   HDL 62 05/14/2017 0856   CHOLHDL 2.0 05/14/2017 0856   CHOLHDL 2.4 03/17/2016 0847   VLDL 14 03/17/2016 0847   LDLCALC 54 05/14/2017 0856    Physical Exam:    VS:  BP 130/66   Pulse 63   Ht 5\' 6"  (1.676 m)   Wt 180 lb (81.6 kg)   SpO2 99%   BMI 29.05 kg/m     Wt Readings from Last 3 Encounters:  09/10/17 180 lb (81.6 kg)  08/23/17 178 lb (80.7 kg)  06/25/17 174 lb 8 oz (79.2 kg)     GEN:  Well nourished, well developed in no acute distress HEENT: Normal NECK: No JVD; No carotid bruits LYMPHATICS: No lymphadenopathy CARDIAC: RRR, no rubs, gallops.  2/6 SM at RUSB RESPIRATORY:  Clear to auscultation without rales, wheezing or rhonchi  ABDOMEN: Soft, non-tender, non-distended MUSCULOSKELETAL:  No edema; No deformity  SKIN: Warm and dry NEUROLOGIC:  Alert and oriented x 3 PSYCHIATRIC:  Normal affect    ASSESSMENT:    1. Coronary artery disease involving native coronary artery of native heart without angina pectoris   2. Dilated aortic root ([place])   3. Hypertension associated with diabetes ([place])   4. Aortic stenosis, mild   5. Hyperlipidemia LDL goal <70    PLAN:    In order of problems listed above:  1.  ASCAD - cath 12/2014 showing 95% proximal RCA and diffusely calcified proximal to distal LAD up to 70% status post rotational atherectomy with DES to the proximal RCA on 02/09/2015.  Repeat cath done for exertional dyspnea and chest pain 01/04/2017 showed 70-80% proximal to mid LAD stenosis with mild progression, patent left circumflex and patent RCA stent with 50-60% mid to distal RCA.  Medical therapy was recommended due to the degree of heavy calcification of the LAD which require athero-ablation either with rotational atherectomy or orbital atherectomy followed by a very long stented segment with a crossover of a very large diagonal branch.  It was felt that PCI should be  reserved for unstable angina or acute coronary syndrome only.  He has not had any anginal symptoms and is tolerating his antianginal therapy.  He will continue on aspirin 81 mg daily, Plavix 75 mg daily, Toprol XL 25 mg daily, Imdur 30 mg daily and statin therapy.  2.  Dilated aortic root - 41 mm on 2D echocardiogram 01/03/2017.  I will repeat a 2D echocardiogram July 2019.  He will continue on statin therapy.  Blood pressure well controlled.  3.  HTN -blood pressure well controlled on exam today.  He will continue on Toprol-XL 25 mg daily.  4.  Mild AS -2D echo showed mild aortic stenosis with mean gradient of 12 mmHg on 01/03/2017.    5.  Hyperlipidemia with LDL goal < 70.  He will continue  on atorvastatin 20 mg daily.  His LDL was 54 on 05/14/2017.   Medication Adjustments/Labs and Tests Ordered: Current medicines are reviewed at length with the patient today.  Concerns regarding medicines are outlined above.  No  orders of the defined types were placed in this encounter.  No orders of the defined types were placed in this encounter.   Signed, Fransico Him, MD  09/10/2017 9:43 AM    [place]

## 2017-09-10 ENCOUNTER — Ambulatory Visit (INDEPENDENT_AMBULATORY_CARE_PROVIDER_SITE_OTHER): Payer: Medicare Other | Admitting: Cardiology

## 2017-09-10 ENCOUNTER — Encounter: Payer: Self-pay | Admitting: Cardiology

## 2017-09-10 VITALS — BP 130/66 | HR 63 | Ht 66.0 in | Wt 180.0 lb

## 2017-09-10 DIAGNOSIS — I10 Essential (primary) hypertension: Secondary | ICD-10-CM

## 2017-09-10 DIAGNOSIS — I2510 Atherosclerotic heart disease of native coronary artery without angina pectoris: Secondary | ICD-10-CM | POA: Diagnosis not present

## 2017-09-10 DIAGNOSIS — I77810 Thoracic aortic ectasia: Secondary | ICD-10-CM | POA: Diagnosis not present

## 2017-09-10 DIAGNOSIS — E785 Hyperlipidemia, unspecified: Secondary | ICD-10-CM | POA: Diagnosis not present

## 2017-09-10 DIAGNOSIS — I350 Nonrheumatic aortic (valve) stenosis: Secondary | ICD-10-CM

## 2017-09-10 DIAGNOSIS — E1159 Type 2 diabetes mellitus with other circulatory complications: Secondary | ICD-10-CM

## 2017-09-10 NOTE — Patient Instructions (Signed)
Medication Instructions:  Your physician recommends that you continue on your current medications as directed. Please refer to the Current Medication list given to you today.  If you need a refill on your cardiac medications, please contact your pharmacy first.  Labwork: None ordered   Testing/Procedures: None ordered   Follow-Up: Your physician wants you to follow-up in: 6 month with [name]. You will receive a reminder letter in the mail two months in advance. If you don't receive a letter, please call our office to schedule the follow-up appointment.  Any Other Special Instructions Will Be Listed Below (If Applicable).   Thank you for choosing [facility], RN  [phone]  If you need a refill on your cardiac medications before your next appointment, please call your pharmacy.

## 2017-09-15 ENCOUNTER — Other Ambulatory Visit: Payer: Self-pay | Admitting: Family Medicine

## 2017-09-15 DIAGNOSIS — F339 Major depressive disorder, recurrent, unspecified: Secondary | ICD-10-CM

## 2017-09-15 DIAGNOSIS — F4323 Adjustment disorder with mixed anxiety and depressed mood: Secondary | ICD-10-CM

## 2017-09-26 ENCOUNTER — Ambulatory Visit: Payer: Medicare Other | Admitting: Adult Health

## 2017-09-26 ENCOUNTER — Telehealth: Payer: Self-pay | Admitting: Cardiology

## 2017-09-26 DIAGNOSIS — E161 Other hypoglycemia: Secondary | ICD-10-CM | POA: Diagnosis not present

## 2017-09-26 DIAGNOSIS — E162 Hypoglycemia, unspecified: Secondary | ICD-10-CM | POA: Diagnosis not present

## 2017-09-26 NOTE — Telephone Encounter (Signed)
Attempt to return call to patient, phone line busy

## 2017-09-26 NOTE — Progress Notes (Deleted)
   Subjective:    Patient ID: Seth Abbott, male    DOB: 01-23-1928, 84 y.o.   MRN: [id number]  HPI:  Seth Abbott presents with  Blood Sugar- A1c- Followed by Dr. Turner/Cardiology Followed by Dr. Kerr/Endocrinology    Review of Systems     Objective:   Physical Exam        Assessment & Plan:

## 2017-09-26 NOTE — Telephone Encounter (Signed)
New Message  Pt states he woke up this morning between 2-3 am sweating, weakness and could barley walk, states he had to grab his walker. Please call

## 2017-09-27 NOTE — Telephone Encounter (Signed)
He was scheduled at our office at [facility] at [facility], the office of Dr. [name]     ----- Message -----  From: Teressa Senter, RN  Sent: 09/26/2017  4:38 PM  To: Fonnie Mu, CMA   Thank you for this information. Where was this appt scheduled?   ----- Message -----  From: Fonnie Mu, CMA  Sent: 09/26/2017  3:44 PM  To: Teressa Senter, RN   Hi! We have a mutual patient, Ron Beske. I know you had tried to return a phone call to him this and his line was busy. He scheduled an appt with our office for 3pm today. He did not show so his son, who was in our office to meet the pt, went to the home and found his father very disoriented and having difficulty speaking. They have called EMS and having him taken to the ED. Just thought you'd like to know.

## 2017-10-01 ENCOUNTER — Ambulatory Visit: Payer: Medicare Other | Admitting: Adult Health

## 2017-11-14 ENCOUNTER — Other Ambulatory Visit: Payer: Self-pay | Admitting: Cardiology

## 2017-11-14 DIAGNOSIS — R0602 Shortness of breath: Secondary | ICD-10-CM

## 2017-12-04 ENCOUNTER — Other Ambulatory Visit: Payer: Self-pay | Admitting: Cardiology

## 2017-12-04 NOTE — Telephone Encounter (Signed)
Pt's pharmacy is requesting a refill on pantoprazole. Would Dr. Turner like to refill this medication? Please address 

## 2017-12-04 NOTE — Telephone Encounter (Signed)
PCP needs to prescribe this

## 2017-12-14 ENCOUNTER — Other Ambulatory Visit: Payer: Self-pay | Admitting: Cardiology

## 2017-12-17 ENCOUNTER — Other Ambulatory Visit: Payer: Self-pay | Admitting: Cardiology

## 2018-01-29 DIAGNOSIS — E119 Type 2 diabetes mellitus without complications: Secondary | ICD-10-CM | POA: Diagnosis not present

## 2018-01-29 DIAGNOSIS — I10 Essential (primary) hypertension: Secondary | ICD-10-CM | POA: Diagnosis not present

## 2018-01-29 DIAGNOSIS — R946 Abnormal results of thyroid function studies: Secondary | ICD-10-CM | POA: Diagnosis not present

## 2018-01-29 DIAGNOSIS — Z794 Long term (current) use of insulin: Secondary | ICD-10-CM | POA: Diagnosis not present

## 2018-01-29 LAB — HEPATIC FUNCTION PANEL
ALK PHOS: 77 (ref 25–125)
ALT: 16 (ref 10–40)
AST: 17 (ref 14–40)
Bilirubin, Total: 0.5

## 2018-01-29 LAB — LIPID PANEL
CHOLESTEROL: 147 (ref 0–200)
HDL: 54 (ref 35–70)
LDL CALC: 81
Triglycerides: 63 (ref 40–160)

## 2018-01-29 LAB — MICROALBUMIN, URINE: MICROALBUMIN (UR) POC: 7.68 mg/L

## 2018-01-29 LAB — BASIC METABOLIC PANEL
BUN: 17 (ref 4–21)
Creatinine: 1 (ref 0.6–1.3)
GLUCOSE: 86
Potassium: 4.5 (ref 3.4–5.3)
Sodium: 138 (ref 137–147)

## 2018-01-29 LAB — HEMOGLOBIN A1C: Hemoglobin A1C: 7.6

## 2018-01-29 LAB — TSH: TSH: 3.98 (ref 0.41–5.90)

## 2018-02-15 DIAGNOSIS — R000 Tachycardia, unspecified: Secondary | ICD-10-CM | POA: Diagnosis not present

## 2018-02-15 DIAGNOSIS — W19XXXA Unspecified fall, initial encounter: Secondary | ICD-10-CM | POA: Diagnosis not present

## 2018-02-15 DIAGNOSIS — S0990XA Unspecified injury of head, initial encounter: Secondary | ICD-10-CM | POA: Diagnosis not present

## 2018-02-18 ENCOUNTER — Encounter: Payer: Self-pay | Admitting: Cardiology

## 2018-02-25 ENCOUNTER — Ambulatory Visit: Payer: Medicare Other

## 2018-02-25 ENCOUNTER — Encounter: Payer: Self-pay | Admitting: Family Medicine

## 2018-02-25 ENCOUNTER — Ambulatory Visit (INDEPENDENT_AMBULATORY_CARE_PROVIDER_SITE_OTHER): Payer: Medicare Other | Admitting: Family Medicine

## 2018-02-25 VITALS — BP 136/65 | HR 64 | Ht 65.0 in | Wt 189.0 lb

## 2018-02-25 DIAGNOSIS — Z794 Long term (current) use of insulin: Secondary | ICD-10-CM

## 2018-02-25 DIAGNOSIS — I749 Embolism and thrombosis of unspecified artery: Secondary | ICD-10-CM | POA: Diagnosis not present

## 2018-02-25 DIAGNOSIS — I10 Essential (primary) hypertension: Secondary | ICD-10-CM | POA: Diagnosis not present

## 2018-02-25 DIAGNOSIS — E1129 Type 2 diabetes mellitus with other diabetic kidney complication: Secondary | ICD-10-CM

## 2018-02-25 DIAGNOSIS — E782 Mixed hyperlipidemia: Secondary | ICD-10-CM | POA: Diagnosis not present

## 2018-02-25 DIAGNOSIS — I2510 Atherosclerotic heart disease of native coronary artery without angina pectoris: Secondary | ICD-10-CM

## 2018-02-25 DIAGNOSIS — E1159 Type 2 diabetes mellitus with other circulatory complications: Secondary | ICD-10-CM

## 2018-02-25 DIAGNOSIS — R0602 Shortness of breath: Secondary | ICD-10-CM | POA: Insufficient documentation

## 2018-02-25 DIAGNOSIS — E1169 Type 2 diabetes mellitus with other specified complication: Secondary | ICD-10-CM | POA: Diagnosis not present

## 2018-02-25 DIAGNOSIS — G459 Transient cerebral ischemic attack, unspecified: Secondary | ICD-10-CM | POA: Diagnosis not present

## 2018-02-25 DIAGNOSIS — R809 Proteinuria, unspecified: Secondary | ICD-10-CM

## 2018-02-25 DIAGNOSIS — E1121 Type 2 diabetes mellitus with diabetic nephropathy: Secondary | ICD-10-CM | POA: Diagnosis not present

## 2018-02-25 NOTE — Progress Notes (Signed)
Assessment and Plan:  1. Type 2 diabetes mellitus with microalbuminuria, with long-term current use of insulin ([place])   2. Hypertension associated with diabetes ([place])   3. Diabetic nephropathy with proteinuria ([place])   4. Combined hyperlipidemia associated with type 2 diabetes mellitus ([place])   5. h/o TIA due to embolism ([place])- 10-15 yrs ago   6. Persistent microalbuminuria associated with type 2 diabetes mellitus ([place])   7. New onset SOB     Hypertension:  Bp is currently stable -Encouraged pt to continue taking bp medicine as directed -Discussed healthy levels of bp for age and cardiovascular history  Cholesterol:  -Cholesterol currently almost at goal at 12; will let cardiology determine medication changes if they desire -Discussed goal levels of LDL due to history of MI and stents -Discussed importance of maintaining healthy cholesterol levels to improve cardiovascular health  SOB -Ordered chest xray today in office which was independently interpreted by myself.   CXR showed left elevated hemidiaphragm with large gastric bubble otherwise normal without any infiltrates, no enlarged heart, no cephalization etc.  No broken ribs\skeletal abnormalities appreciated -We will 5 patient if radiology reads anything different  -Encouraged pt to discuss SOB symptoms with cardiologist since lung sounds sound good this could be a coronary artery issue. -Also physical exam findings are WNL's- no GI/ abd, chest wall, or Neuro findings  -Strongly encouraged pt to go to the hospital if he falls again, or develops any new symptoms of dizziness, visual changes, headache, chest pain or any other concerning new symptoms -Instructed pt to use his walker every night for increased stability -Discussed possibility of moving to a safer place, pt refuses now but understands that if this instability increases he will need to be in a safer environment -Discussed red flag symptoms and instructed pt to  contact us promptly -Discussed increased pt risk for multiple complications since he's on blood thinners  Diabetes:    -A1c at goal but still with some BS lows (79) in early am's ; being seen and managed by [name] for this- told pt to call him if BS's don't level out more -Encouraged pt to continue tracking his blood sugar -Encouraged pt to make healthy dietary changes including portion control to help better manage his diabetes and not gain any more wt. - told pt that having to eat a cookie every morning around 4am because BS is low and he feels funny is not a healthy way to manage those; he should talk to [name] about this   Pt was in the office today for 32.5+ minutes, with over 50% time spent in face to face counseling of patients various medical conditions, treatment plans of those medical conditions including medicine management and lifestyle modification, strategies to improve health and well being; and in coordination of care. SEE ABOVE TREATMENT PLAN FOR DETAILS   -Gross side effects, risk and benefits, and alternatives of medications discussed with patient.  Patient is aware that all medications have potential side effects and we are unable to predict every side effect or drug-drug interaction that may occur.  Expresses verbal understanding and consents to current therapy plan and treatment regimen.    Follow Up:  Return for 3-46mo f/up for chronic issues otherwise as needed acute concerns.   Please see AVS handed out to patient at the end of our visit for further patient instructions/ counseling done pertaining to today's office visit.    Note:  This document was prepared  using Systems analyst and may include unintentional dictation errors. This document serves as a record of services personally performed by [name], MD. It was created on her behalf by [name], a trained medical scribe. The creation of this record is based on the scribe's personal  observations and the provider's statements to them.   I have reviewed the above medical documentation for accuracy and completeness and I concur.  [name] 02/26/18 5:50 PM    ----------------------------------------------------------------------------------------------------------------------  Subjective:  HPI: Seth Abbott 84 y.o. male  presents for 3 month follow up for multiple medical problems.   HPI:  Seth Abbott y.o. male presents for 3 month follow up for multiple medical problems.   Sleep Fall -Fall occurred 10 days ago -Pt states he "woke up on the floor" recently and fell out of bed -Pt has a black eye and states he hit his head -States this is the second time he has fallen without knowing why  -Sons called the paramedics who checked on him; they recommended for him to go to the hospital but he refused -Denies GI problems, abdominal pain, or back pain, no new blood in urine or stool,  No new headaches after fall. HE denies any visual changes, dizziness, but does state he's more short of breath than usual since fall.  -Denies chest pain, heart palpitations, orthopnea, or PND, increased swelling in lower extremities -Denies any other symptoms at this time and states he feels otherwise well but was concerned enough about feeling short of breath that he has not been working any heavy machinery like chainsaws etc, which he normally does   -Pt has an appointment with cardiology next week- [name] that was already scheduled  Blood work  Checked urine, bmp, cholesterol etc at [name] office around 08/20 which we reviewed in the office today  Kidney Ser/Creatinine at 0.99 Urine Microalbumine creatinine ration at 134   Diabetes  A1C= 7.6 around 01/29/2018 -Instructed by [name] to take 20mg  BID of long acting medication -No change to short acting medication -States he had been experiencing lows at night -States its usually around 200, was 300 at the  highest but only once -Pt says its usually around 80s and 90s at the lowest, and has to wake up at night to eat sometimes because of how his blood sugar is   Pt reports compliance with medications  Denies polyuria/polydipsia. Denies hypoglycemia symptoms  Last diabetic eye exam was No results found for: HMDIABEYEEXA  Foot exam-  UTD  Lab Results  Component Value Date   HGBA1C 7.8 07/30/2017   HGBA1C 9.1 04/25/2017   HGBA1C 8.7 (H) 02/02/2017    Lab Results  Component Value Date   MICROALBUR 80 02/01/2017   LDLCALC 54 05/14/2017   CREATININE 0.84 02/02/2017    Hyperlipidemia LDL 81 Triglycerides  HDL 54  -Sees [name] for cardiology Pt reports compliance with medications and/ or treatment plan Denies S-E  RUQ pain-   no  Muscle aches-   no Patient reports poor compliance with low fat/low cholesterol diet and healthier lifestyle  modifications.   Last lipid panel as follows:  Lab Results  Component Value Date   CHOL 126 05/14/2017   HDL 62 05/14/2017   LDLCALC 54 05/14/2017   TRIG 48 05/14/2017   CHOLHDL 2.0 05/14/2017    Hypertension  Pt reports compliance with medications and/ or treatment plan Denies S-E.   Smoking Status noted  Patient denies new onset  of sx- no chest pain, dizziness, HA, DIB/ shortness of breath  or swelling. Lab Results  Component Value Date   CREATININE 0.84 02/02/2017    Last 3 blood pressure readings in our office are as follows: BP Readings from Last 3 Encounters:  02/25/18 136/65  09/10/17 130/66  08/23/17 137/68    The CVD Risk score (D'Agostino, et al., 2008) failed to calculate for the following reasons:   CVD risk score not calculated    Weight:  Wt Readings from Last 3 Encounters:  02/25/18 189 lb (85.7 kg)  09/10/17 180 lb (81.6 kg)  08/23/17 178 lb (80.7 kg)   BMI Readings from Last 3 Encounters:  02/25/18 31.45 kg/m  09/10/17 29.05 kg/m  08/23/17 28.73 kg/m      Patient Care Team     Relationship Specialty Notifications Start End  [name], DO PCP - General Family Medicine  01/31/17   Sueanne Margarita, MD PCP - Cardiology Cardiology  10/12/13    Comment: Santa Lighter, MD Consulting Physician Endocrinology  01/31/17      Review of Systems: General:   Denies fever, chills, unexplained weight loss.  Optho/Auditory:   Denies visual changes, blurred vision/LOV Respiratory:   Denies SOB, DOE more than baseline levels.   Cardiovascular:   Denies chest pain, palpitations, new onset peripheral edema  Gastrointestinal:   Denies nausea, vomiting, diarrhea.  Genitourinary: Denies dysuria, freq/ urgency, flank pain or discharge from genitals.  Endocrine:     Denies hot or cold intolerance, polyuria, polydipsia. Musculoskeletal:   Denies unexplained myalgias, joint swelling, unexplained arthralgias, gait problems.  Skin:  Denies rash, suspicious lesions Neurological:     Denies dizziness, unexplained weakness, numbness  Psychiatric/Behavioral:   Denies mood changes, suicidal or homicidal ideations, hallucinations    Objective: Physical Exam: BP 136/65   Pulse 64   Ht 5\' 5"  (1.651 m)   Wt 189 lb (85.7 kg)   SpO2 99%   BMI 31.45 kg/m  Body mass index is 31.45 kg/m. General: Well nourished, in no apparent distress. Eyes: EOMs, conjunctiva clr no swelling or erythema ENT/Mouth: Hearing appears normal.  Mucus Membranes Moist  Neck: Supple, no masses, no carotid bruits b/l Resp: Respiratory effort- normal, ECTA B/L w/o W/R/R  Cardio: RRR without murmur appreciated.  No tenderness to palpation along rib cage/ thorax, costophrenic or costosternal borders.  Abdomen: + mild gross distention. Postive bowel signs x4 No guarding rigidity or rebound No organomegaly No suprapubic tenderness No costophrenic angle tenderness Lymphatics:  Brisk peripheral pulses, less 2 sec cap RF, no gross edema  M-sk: Full ROM, 5/5 strength, normal gait.  Skin: Warm, dry.  Large  ecchymotic skin lesion right lower back radiating laterally-  R T3 through S1 area  Neuro: Alert, Oriented, CN II-XII GI, PERRLA, acting otherwise WNL's Psych: Normal affect, Insight and Judgment appropriate.      Current Medications:  Current Outpatient Medications on File Prior to Visit  Medication Sig Dispense Refill  . ACCU-CHEK AVIVA PLUS test strip USE TO CHECK BLOOD SUGAR 3 TIMES DAILY  11  . aspirin 81 MG tablet Take 81 mg by mouth every morning.     [name] atorvastatin (LIPITOR) 20 MG tablet Take 20 mg by mouth every morning.     . Cholecalciferol (VITAMIN D3) 5000 units TABS 5,000 IU OTC vitamin D3 daily. 90 tablet 3  . clopidogrel (PLAVIX) 75 MG tablet TAKE 1 TABLET BY MOUTH EVERY DAY 90 tablet 3  . FLUoxetine (PROZAC) 20  MG tablet TAKE 1 TABLET BY MOUTH EVERY DAY 90 tablet 0  . insulin lispro (HUMALOG) 100 UNIT/ML cartridge Inject 10-15 Units into the skin 2 (two) times daily. Pt ranges his insulin with his blood sugar readings    . insulin NPH Human (HUMULIN N,NOVOLIN N) 100 UNIT/ML injection Inject 25 Units into the skin 2 (two) times daily before a meal.     . isosorbide mononitrate (IMDUR) 30 MG 24 hr tablet TAKE 1 TABLET BY MOUTH EVERY DAY 30 tablet 9  . metoprolol succinate (TOPROL-XL) 25 MG 24 hr tablet TAKE 1 TABLET BY MOUTH DAILY . TAKE WITH OR IMMEDIATELY FOLLOWING A MEAL 30 tablet 11  . Multiple Vitamins-Minerals (MULTIVITAMIN WITH MINERALS) tablet Take 1 tablet by mouth daily.    . nitroGLYCERIN (NITROSTAT) 0.4 MG SL tablet Place 1 tablet (0.4 mg total) under the tongue every 5 (five) minutes as needed for chest pain (up to 3 doses). 25 tablet 3  . pantoprazole (PROTONIX) 20 MG tablet TAKE 1 TABLET BY MOUTH EVERY DAY 90 tablet 2   No current facility-administered medications on file prior to visit.     Medical History:  Patient Active Problem List   Diagnosis Date Noted  . Persistent microalbuminuria associated with type 2 diabetes mellitus ([place]) 08/23/2017     Priority: High  . Depression, recurrent ([place]) 06/25/2017    Priority: High  . Diabetic nephropathy with proteinuria ([place]) 01/31/2017    Priority: High  . Combined hyperlipidemia associated with type 2 diabetes mellitus ([facility]) 01/31/2017    Priority: High  . Type II Diabetes mellitus with microalbuminuria, with long-term current use of insulin ([facility])     Priority: High  . Hypertension associated with diabetes ([place])     Priority: High  . S/P coronary artery stent placement     Priority: Medium  . h/o TIA due to embolism ([place])- 10-15 yrs ago     Priority: Medium  . Hyperlipidemia LDL goal <70 06/28/2017    Priority: Low  . Vitamin D insufficiency 02/08/2017    Priority: Low  . Aortic stenosis, mild     Priority: Low  . New onset SOB 02/25/2018  . CAD (coronary artery disease), native coronary artery 06/28/2017  . Adjustment disorder with mixed anxiety and depressed mood 02/08/2017  . Arthritis 02/04/2017  . Sepsis ([place]) 01/03/2017  . Acute metabolic encephalopathy 35/45/6256  . Hyponatremia 01/03/2017  . Bronchitis 01/03/2017  . SOB (shortness of breath) 08/16/2015  . Abnormal PFTs (pulmonary function tests) 08/16/2015  . Dilated aortic root ([place])   . Prostatitis   . Shingles     Allergies:  No Known Allergies   Family history-  Reviewed; changed as appropriate  Social history-  Reviewed; changed as appropriate

## 2018-02-25 NOTE — Patient Instructions (Addendum)
-  Your chest x-ray looked good today in the office.  Since your exam was essentially normal and you have no other symptoms besides shortness of breath, it is important that you mention this to your cardiologist, whom you are seeing next week, as it could be new blockages in your heart causing the symptoms as well. -Otherwise if you develop any new symptoms or concerns, please proceed to the emergency room or call the office for further advice\management    Shortness of Breath, Adult  Shortness of breath is when a person has trouble breathing enough air, or when a person feels like she or he is having trouble breathing in enough air. Shortness of breath could be a sign of medical problem. Follow these instructions at home: Pay attention to any changes in your symptoms. Take these actions to help with your condition:  Do not smoke. Smoking is a common cause of shortness of breath. If you smoke and you need help quitting, ask your health care provider.  Avoid things that can irritate your airways, such as: ? Mold. ? Dust. ? Air pollution. ? Chemical fumes. ? Things that can cause allergy symptoms (allergens), if you have allergies.  Keep your living space clean and free of mold and dust.  Rest as needed. Slowly return to your usual activities.  Take over-the-counter and prescription medicines, including oxygen and inhaled medicines, only as told by your health care provider.  Keep all follow-up visits as told by your health care provider. This is important.  Contact a health care provider if:  Your condition does not improve as soon as expected.  You have a hard time doing your normal activities, even after you rest.  You have new symptoms. Get help right away if:  Your shortness of breath gets worse.  You have shortness of breath when you are resting.  You feel light-headed or you faint.  You have a cough that is not controlled with medicines.  You cough up blood.  You  have pain with breathing.  You have pain in your chest, arms, shoulders, or abdomen.  You have a fever.  You cannot walk up stairs or exercise the way that you normally do. This information is not intended to replace advice given to you by your health care provider. Make sure you discuss any questions you have with your health care provider. Document Released: 02/21/2001 Document Revised: 12/18/2015 Document Reviewed: 11/04/2015 Elsevier Interactive Patient Education  [employer].

## 2018-03-05 ENCOUNTER — Ambulatory Visit (INDEPENDENT_AMBULATORY_CARE_PROVIDER_SITE_OTHER): Payer: Medicare Other | Admitting: Cardiology

## 2018-03-05 ENCOUNTER — Encounter: Payer: Self-pay | Admitting: Cardiology

## 2018-03-05 VITALS — BP 146/68 | HR 64 | Ht 65.0 in | Wt 191.0 lb

## 2018-03-05 DIAGNOSIS — E1159 Type 2 diabetes mellitus with other circulatory complications: Secondary | ICD-10-CM | POA: Diagnosis not present

## 2018-03-05 DIAGNOSIS — I77810 Thoracic aortic ectasia: Secondary | ICD-10-CM | POA: Diagnosis not present

## 2018-03-05 DIAGNOSIS — R0602 Shortness of breath: Secondary | ICD-10-CM

## 2018-03-05 DIAGNOSIS — I152 Hypertension secondary to endocrine disorders: Secondary | ICD-10-CM

## 2018-03-05 DIAGNOSIS — E785 Hyperlipidemia, unspecified: Secondary | ICD-10-CM

## 2018-03-05 DIAGNOSIS — I2510 Atherosclerotic heart disease of native coronary artery without angina pectoris: Secondary | ICD-10-CM

## 2018-03-05 DIAGNOSIS — I10 Essential (primary) hypertension: Secondary | ICD-10-CM | POA: Diagnosis not present

## 2018-03-05 MED ORDER — ISOSORBIDE MONONITRATE ER 60 MG PO TB24: 60.0000 mg | ORAL_TABLET | Freq: Every day | ORAL | 3 refills | Status: DC

## 2018-03-05 MED ORDER — ATORVASTATIN CALCIUM 40 MG PO TABS: 20.0000 mg | ORAL_TABLET | Freq: Every morning | ORAL | 3 refills | Status: DC

## 2018-03-05 MED ORDER — ATORVASTATIN CALCIUM 40 MG PO TABS: 40.0000 mg | ORAL_TABLET | Freq: Every morning | ORAL | 3 refills | Status: DC

## 2018-03-05 MED ORDER — ISOSORBIDE MONONITRATE ER 60 MG PO TB24: 30.0000 mg | ORAL_TABLET | Freq: Every day | ORAL | 3 refills | Status: DC

## 2018-03-05 NOTE — Addendum Note (Signed)
Addended by: Drue Novel I on: 03/05/2018 08:54 AM   Modules accepted: Orders

## 2018-03-05 NOTE — Patient Instructions (Signed)
Medication Instructions:  Your physician has recommended you make the following change in your medication:   1. INCREASE: atorvastatin (lipitor) to 40 mg tablet once a day  2. INCREASE: isosorbide mononitrate (imdur) to 60 mg tablet once a day   Labwork: Your physician recommends that you return for a FASTING lipid profile and liver function panel in 6 weeks   Testing/Procedures: Your physician has requested that you have an echocardiogram. Echocardiography is a painless test that uses sound waves to create images of your heart. It provides your doctor with information about the size and shape of your heart and how well your heart's chambers and valves are working. This procedure takes approximately one hour. There are no restrictions for this procedure.  Follow-Up: Your physician recommends that you schedule a follow-up appointment in: 3-4 weeks with APP on Dr. Theodosia Blender team  Your physician wants you to follow-up in: 1 year with [name]. You will receive a reminder letter in the mail two months in advance. If you don't receive a letter, please call our office to schedule the follow-up appointment.    Any Other Special Instructions Will Be Listed Below (If Applicable).     If you need a refill on your cardiac medications before your next appointment, please call your pharmacy.

## 2018-03-05 NOTE — Progress Notes (Signed)
Cardiology Office Note:    Date:  03/05/2018   ID:  Seth Abbott, DOB 1928-04-16, MRN [id number]  PCP:  Seth Dance, DO  Cardiologist:  Seth Him, MD    Referring MD: Seth Dance, DO   Chief Complaint  Patient presents with  . Coronary Artery Disease  . Hypertension  . Hyperlipidemia    History of Present Illness:    Seth Abbott is a 84 y.o. male with a hx of ASCAD with cath 12/2014 showing 95% proximal RCA and diffusely calcified proximal to distal LAD up to 70% status post rotational atherectomy with DES to the proximal RCA on 02/09/2015.  Repeat cath done for exertional dyspnea and chest pain 01/04/2017 showed 70-80% proximal to mid LAD stenosis with mild progression, patent left circumflex and patent RCA stent with 50-60% mid to distal RCA.  Medical therapy was recommended due to the degree of heavy calcification of the LAD which require athero-ablation either with rotational atherectomy or orbital atherectomy followed by a very long stented segment with a crossover of a very large diagonal branch.  It was felt that PCI should be  reserved for unstable angina or acute coronary syndrome only. He also has a history of dilated aortic root at 41 mm by echo in July 2018, GERD, hypertension, type 2 diabetes mellitus and hyperlipidemia.  He is here today for followup and is doing well.  He denies any chest pain or pressure, PND, orthopnea, LE edema, dizziness, palpitations or syncope.  Recently he is complained of increasing shortness of breath over the past few weeks.  This is mainly with exertion.  His son says that he has gained some weight and has become more sedentary and not exercising like he had been.  He denies any complaints of allergies.  The patient thinks it may be related to his the hot weather.  He is compliant with his meds and is tolerating meds with no SE.    Past Medical History:  Diagnosis Date  . Aortic stenosis, mild    by echo 12/2016  . Arthritis    "mostly in my hands" (02/09/2015)  . CAD (coronary artery disease)    a. 12/2014 cath showed 95% prox RCA and diffuse calcified prox to distal LAD disease up to 70% at multiple spots. S/p rotational atherectomy/DES to prox RCA 02/09/15. Repeat cath 01/04/2017 showed 70-80% prox to mid LAD stenosis with mild progression since 2016, patent LCx, patent RCA stent and 50-60% mid to distal RCA with normal LVF and normal LVEDP.  Medical management was recommended unless he ha  . Dilated aortic root ([place])    3mm by echo 12/2016  . GERD (gastroesophageal reflux disease)   . HTN (hypertension)   . Hyperlipidemia   . Prostatitis   . Shingles   . TIA (transient ischemic attack) early 1990's; 09/2006   Seth Abbott 10/26/2010  . Type II diabetes mellitus ([place])     Past Surgical History:  Procedure Laterality Date  . CARDIAC CATHETERIZATION N/A 12/11/2014   Procedure: Left Heart Cath and Coronary Angiography;  Surgeon: Seth Crome, MD;  Location: [facility];  Service: Cardiovascular;  Laterality: N/A;  . CARDIAC CATHETERIZATION N/A 02/09/2015   Procedure: Coronary Stent Intervention-Rotoblator, temp pacer;  Surgeon: Seth Crome, MD;  Location: [facility];  Service: Cardiovascular;  Laterality: N/A;  . CATARACT EXTRACTION W/ INTRAOCULAR LENS  IMPLANT, BILATERAL Bilateral   . EXCISIONAL HEMORRHOIDECTOMY  1957  . EYE SURGERY    .  LEFT HEART CATH AND CORONARY ANGIOGRAPHY N/A 01/04/2017   Procedure: Left Heart Cath and Coronary Angiography;  Surgeon: Seth Crome, MD;  Location: [facility];  Service: Cardiovascular;  Laterality: N/A;  . MEMBRANE PEEL Left 01/11/2015   eye    Current Medications: Current Meds  Medication Sig  . ACCU-CHEK AVIVA PLUS test strip USE TO CHECK BLOOD SUGAR 3 TIMES DAILY  . aspirin 81 MG tablet Take 81 mg by mouth every morning.   [name] atorvastatin (LIPITOR) 40 MG tablet Take 0.5 tablets (20 mg total) by mouth every morning.  . Cholecalciferol (VITAMIN D3) 5000  units TABS 5,000 IU OTC vitamin D3 daily.  . clopidogrel (PLAVIX) 75 MG tablet TAKE 1 TABLET BY MOUTH EVERY DAY  . FLUoxetine (PROZAC) 20 MG tablet TAKE 1 TABLET BY MOUTH EVERY DAY  . insulin lispro (HUMALOG) 100 UNIT/ML cartridge Inject 10-15 Units into the skin 2 (two) times daily. Pt ranges his insulin with his blood sugar readings  . insulin NPH Human (HUMULIN N,NOVOLIN N) 100 UNIT/ML injection Inject 25 Units into the skin 2 (two) times daily before a meal.   . isosorbide mononitrate (IMDUR) 60 MG 24 hr tablet Take 0.5 tablets (30 mg total) by mouth daily.  . metoprolol succinate (TOPROL-XL) 25 MG 24 hr tablet TAKE 1 TABLET BY MOUTH DAILY . TAKE WITH OR IMMEDIATELY FOLLOWING A MEAL  . Multiple Vitamins-Minerals (MULTIVITAMIN WITH MINERALS) tablet Take 1 tablet by mouth daily.  . nitroGLYCERIN (NITROSTAT) 0.4 MG SL tablet Place 1 tablet (0.4 mg total) under the tongue every 5 (five) minutes as needed for chest pain (up to 3 doses).  . pantoprazole (PROTONIX) 20 MG tablet TAKE 1 TABLET BY MOUTH EVERY DAY  . [DISCONTINUED] atorvastatin (LIPITOR) 20 MG tablet Take 20 mg by mouth every morning.   . [DISCONTINUED] isosorbide mononitrate (IMDUR) 30 MG 24 hr tablet TAKE 1 TABLET BY MOUTH EVERY DAY     Allergies:   Patient has no known allergies.   Social History   Socioeconomic History  . Marital status: Widowed    Spouse name: Not on file  . Number of children: Not on file  . Years of education: Not on file  . Highest education level: Not on file  Occupational History  . Occupation: retired  Scientific laboratory technician  . Financial resource strain: Not on file  . Food insecurity:    Worry: Not on file    Inability: Not on file  . Transportation needs:    Medical: Not on file    Non-medical: Not on file  Tobacco Use  . Smoking status: Former Smoker    Types: Cigarettes  . Smokeless tobacco: Never Used  . Tobacco comment: "haven't smoked since I was 15" tried it once  Substance and Sexual  Activity  . Alcohol use: Yes    Alcohol/week: 0.0 standard drinks    Comment: 02/09/2015 "might have a sip of wne a dozen times/yr"  . Drug use: No  . Sexual activity: Not Currently  Lifestyle  . Physical activity:    Days per week: Not on file    Minutes per session: Not on file  . Stress: Not on file  Relationships  . Social connections:    Talks on phone: Not on file    Gets together: Not on file    Attends religious service: Not on file    Active member of club or organization: Not on file    Attends meetings of clubs or organizations:  Not on file    Relationship status: Not on file  Other Topics Concern  . Not on file  Social History Narrative  . Not on file     Family History: The patient's family history includes CAD in his father; COPD in his brother; Diabetes in his father, mother, sister, and sister; Heart attack in his father.  ROS:   Please see the history of present illness.    ROS  All other systems reviewed and negative.   EKGs/Labs/Other Studies Reviewed:    The following studies were reviewed today: none  EKG:  EKG is  ordered today and showed NSR with no ST changes  Recent Labs: 05/14/2017: ALT 17   Recent Lipid Panel    Component Value Date/Time   CHOL 126 05/14/2017 0856   TRIG 48 05/14/2017 0856   HDL 62 05/14/2017 0856   CHOLHDL 2.0 05/14/2017 0856   CHOLHDL 2.4 03/17/2016 0847   VLDL 14 03/17/2016 0847   LDLCALC 54 05/14/2017 0856    Physical Exam:    VS:  BP (!) 146/68   Pulse 64   Ht 5\' 5"  (1.651 m)   Wt 191 lb (86.6 kg)   BMI 31.78 kg/m     Wt Readings from Last 3 Encounters:  03/05/18 191 lb (86.6 kg)  02/25/18 189 lb (85.7 kg)  09/10/17 180 lb (81.6 kg)     GEN:  Well nourished, well developed in no acute distress HEENT: Normal NECK: No JVD; No carotid bruits LYMPHATICS: No lymphadenopathy CARDIAC: RRR, no murmurs, rubs, gallops RESPIRATORY:  Clear to auscultation without rales, wheezing or rhonchi  ABDOMEN: Soft,  non-tender, non-distended MUSCULOSKELETAL:  No edema; No deformity  SKIN: Warm and dry NEUROLOGIC:  Alert and oriented x 3 PSYCHIATRIC:  Normal affect   ASSESSMENT:    1. Coronary artery disease involving native coronary artery of native heart without angina pectoris   2. Dilated aortic root ([place])   3. SOB (shortness of breath)   4. Hypertension associated with diabetes ([place])   5. Hyperlipidemia LDL goal <70   6. Shortness of breath    PLAN:    In order of problems listed above:  1.  ASCAD - s/p rotational atherectomy with DES to the proximal RCA on 02/09/2015.  Repeat cath 01/04/2017 showed 70-80% proximal to mid LAD stenosis with mild progression, patent left circumflex and patent RCA stent with 50-60% mid to distal RCA.  Medical therapy was recommended due to the degree of heavy calcification of the LAD which require athero-ablation either with rotational atherectomy or orbital atherectomy followed by a very long stented segment with a crossover of a very large diagonal branch.  It was felt that PCI should be  reserved for unstable angina or acute coronary syndrome only.  He denies any anginal chest pain but has been having some problems with increasing shortness of breath over the past few weeks.  He will continue onASA 81mg  daily, statin, Plavix 75mg  daily, long-acting nitrate and BB.  2.  Dilated aortic root - 41 mm on echo 01/03/2017. He will continue on statin Rx and I will repeat echo to make sure this is stable.  3.  HTN - BP controlled today on exam.  Continue BB.   4.  Mild AS -2D echo showed mild aortic stenosis with mean gradient of 12 mmHg on 01/03/2017.    5.  Hyperlipidemia with LDL goal < 70.  LDL was 81 on 01/2018.  Increase Lipitor to 40mg  daily and repeat  FLP and ALT in 6 weeks  6.  Shortness of breath -this has become progressively worse over the past month.  I suspect this is multifactorial related to weight gain recently as well as sedentary state with decreased  exercise.  I suspect he is become deconditioned as he is also tired as well.  He does have significant CAD and this could be due to progression of CAD with an anginal equivalent.  I recommend increasing Imdur to 60 mg daily even the fact that on last catheter was recommended that PCI only be done for acute coronary syndrome or recalcitrant angina.  I will increase Imdur to 60 mg daily.  He will see my PA back in a few weeks.  If his symptoms do not improve we may need to consider nuclear stress testing.   Medication Adjustments/Labs and Tests Ordered: Current medicines are reviewed at length with the patient today.  Concerns regarding medicines are outlined above.  Orders Placed This Encounter  Procedures  . Hepatic function panel  . Lipid panel  . EKG 12-Lead  . ECHOCARDIOGRAM COMPLETE   Meds ordered this encounter  Medications  . isosorbide mononitrate (IMDUR) 60 MG 24 hr tablet    Sig: Take 0.5 tablets (30 mg total) by mouth daily.    Dispense:  90 tablet    Refill:  3  . atorvastatin (LIPITOR) 40 MG tablet    Sig: Take 0.5 tablets (20 mg total) by mouth every morning.    Dispense:  90 tablet    Refill:  3    Signed, Seth Him, MD  03/05/2018 8:29 AM    [facility]

## 2018-03-11 ENCOUNTER — Encounter: Payer: Self-pay | Admitting: Cardiology

## 2018-03-11 ENCOUNTER — Ambulatory Visit (HOSPITAL_COMMUNITY): Payer: Medicare Other | Attending: Cardiology

## 2018-03-11 ENCOUNTER — Other Ambulatory Visit: Payer: Self-pay

## 2018-03-11 DIAGNOSIS — I10 Essential (primary) hypertension: Secondary | ICD-10-CM | POA: Diagnosis not present

## 2018-03-11 DIAGNOSIS — E785 Hyperlipidemia, unspecified: Secondary | ICD-10-CM | POA: Diagnosis not present

## 2018-03-11 DIAGNOSIS — I77810 Thoracic aortic ectasia: Secondary | ICD-10-CM | POA: Diagnosis not present

## 2018-03-11 DIAGNOSIS — R0602 Shortness of breath: Secondary | ICD-10-CM | POA: Diagnosis not present

## 2018-03-11 DIAGNOSIS — E119 Type 2 diabetes mellitus without complications: Secondary | ICD-10-CM | POA: Insufficient documentation

## 2018-03-11 DIAGNOSIS — I2510 Atherosclerotic heart disease of native coronary artery without angina pectoris: Secondary | ICD-10-CM | POA: Diagnosis not present

## 2018-03-11 DIAGNOSIS — Z8673 Personal history of transient ischemic attack (TIA), and cerebral infarction without residual deficits: Secondary | ICD-10-CM | POA: Diagnosis not present

## 2018-03-11 DIAGNOSIS — I352 Nonrheumatic aortic (valve) stenosis with insufficiency: Secondary | ICD-10-CM | POA: Diagnosis not present

## 2018-03-11 DIAGNOSIS — I351 Nonrheumatic aortic (valve) insufficiency: Secondary | ICD-10-CM | POA: Insufficient documentation

## 2018-03-12 ENCOUNTER — Telehealth: Payer: Self-pay

## 2018-03-12 DIAGNOSIS — I351 Nonrheumatic aortic (valve) insufficiency: Secondary | ICD-10-CM

## 2018-03-12 NOTE — Telephone Encounter (Signed)
Spoke with the patient about his results, he expressed understanding and had no further questions. A follow up echo order was placed for around 03/13/19

## 2018-03-12 NOTE — Telephone Encounter (Signed)
-----   Message from Sueanne Margarita, MD sent at 03/11/2018  2:58 PM EDT ----- Please let patient know that echo showed moderately thickened heart muscle with normal LV systolic function and increased stiffness of the heart muscle related to aging.  He does have calcification of the aortic valve with very mild aortic stenosis and mild to moderate aortic insufficiency.  Please repeat echo in 1 year to reassess aortic insufficiency.

## 2018-03-29 ENCOUNTER — Other Ambulatory Visit: Payer: Self-pay | Admitting: Cardiology

## 2018-04-11 ENCOUNTER — Other Ambulatory Visit: Payer: Self-pay | Admitting: Family Medicine

## 2018-04-11 DIAGNOSIS — F4323 Adjustment disorder with mixed anxiety and depressed mood: Secondary | ICD-10-CM

## 2018-04-11 DIAGNOSIS — F339 Major depressive disorder, recurrent, unspecified: Secondary | ICD-10-CM

## 2018-04-12 DIAGNOSIS — Z961 Presence of intraocular lens: Secondary | ICD-10-CM | POA: Diagnosis not present

## 2018-04-12 DIAGNOSIS — H35372 Puckering of macula, left eye: Secondary | ICD-10-CM | POA: Diagnosis not present

## 2018-04-12 DIAGNOSIS — E113293 Type 2 diabetes mellitus with mild nonproliferative diabetic retinopathy without macular edema, bilateral: Secondary | ICD-10-CM | POA: Diagnosis not present

## 2018-04-18 NOTE — Progress Notes (Signed)
Cardiology Office Note   Date:  04/21/2018   ID:  Seth Abbott, DOB 05-24-1928, MRN [id number]  PCP:  [name], DO  Cardiologist:  [name]    Chief Complaint  Patient presents with  . Shortness of Breath      History of Present Illness: Seth Abbott is a 84 y.o. male who presents for dyspnea.  He has a hx of ASCADwith cath 12/2014 showing 95% proximal RCA and diffusely calcified proximal to distal LAD up to 70% status post rotational atherectomy with DES to the proximal RCA on 02/09/2015. Repeat cath done for exertional dyspnea and chest pain 01/04/2017 showed 70-80% proximal to mid LAD stenosis with mild progression, patent left circumflex and patent RCA stent with 50-60% mid to distal RCA. Medical therapy was recommended due to the degree of heavy calcification of the LAD which require athero-ablation either with rotational atherectomy or orbital atherectomy followed by a very long stented segment with a crossover of a very large diagonal branch. It was felt that PCI should be reserved for unstable angina or acute coronary syndrome only.He also has a history of dilated aortic root at 41 mm by echo in July 2018, GERD, hypertension,type 2 diabetes mellitus and hyperlipidemia.  Recent visit with [name] he complained of increasing shortness of breath over the past few weeks,  mainly with exertion.  His son says that he has gained some weight and has become more sedentary and not exercising like he had been.  He denies any complaints of allergies.  The patient thinks it may be related to his the hot weather.  He is compliant with his meds and is tolerating meds with no SE.  [name] felt it was related to sedentary lifestyle, and deconditioning.  Though he does have significant CAD and could be anginal equivalent.  His imdur was increased to 60 mg .  On last cath " last catheter was recommended that PCI only be done for acute coronary syndrome or recalcitrant angina."   Plan had been if no improvement he would need lexiscan Myoview.  Last echo 03/11/18 mild aotic stenosis and mild to moderate aortic insuff.    Today he has not chest pain and with increase of imdur he has not noticed any improvement in his dyspnea.  His BP has improved.  His son is with him today for visit. He does not wake from sleep with dyspnea.  Only with exertion.   Past Medical History:  Diagnosis Date  . Aortic insufficiency    Mild to moderate by echo 02/2018  . Aortic stenosis, mild    by echo 02/2018  . Arthritis    "mostly in my hands" (02/09/2015)  . CAD (coronary artery disease)    a. 12/2014 cath showed 95% prox RCA and diffuse calcified prox to distal LAD disease up to 70% at multiple spots. S/p rotational atherectomy/DES to prox RCA 02/09/15. Repeat cath 01/04/2017 showed 70-80% prox to mid LAD stenosis with mild progression since 2016, patent LCx, patent RCA stent and 50-60% mid to distal RCA with normal LVF and normal LVEDP.  Medical management was recommended unless he ha  . Dilated aortic root ([place])    28mm by echo 12/2016  . GERD (gastroesophageal reflux disease)   . HTN (hypertension)   . Hyperlipidemia   . Prostatitis   . Shingles   . TIA (transient ischemic attack) early 1990's; 09/2006   [name] 10/26/2010  . Type II diabetes mellitus ([place])  Past Surgical History:  Procedure Laterality Date  . CARDIAC CATHETERIZATION N/A 12/11/2014   Procedure: Left Heart Cath and Coronary Angiography;  Surgeon: Belva Crome, MD;  Location: [facility];  Service: Cardiovascular;  Laterality: N/A;  . CARDIAC CATHETERIZATION N/A 02/09/2015   Procedure: Coronary Stent Intervention-Rotoblator, temp pacer;  Surgeon: Belva Crome, MD;  Location: [facility];  Service: Cardiovascular;  Laterality: N/A;  . CATARACT EXTRACTION W/ INTRAOCULAR LENS  IMPLANT, BILATERAL Bilateral   . EXCISIONAL HEMORRHOIDECTOMY  1957  . EYE SURGERY    . LEFT HEART CATH AND CORONARY ANGIOGRAPHY  N/A 01/04/2017   Procedure: Left Heart Cath and Coronary Angiography;  Surgeon: Belva Crome, MD;  Location: [facility];  Service: Cardiovascular;  Laterality: N/A;  . MEMBRANE PEEL Left 01/11/2015   eye     Current Outpatient Medications  Medication Sig Dispense Refill  . ACCU-CHEK AVIVA PLUS test strip USE TO CHECK BLOOD SUGAR 3 TIMES DAILY  11  . aspirin 81 MG tablet Take 81 mg by mouth every morning.     [name] atorvastatin (LIPITOR) 40 MG tablet Take 1 tablet (40 mg total) by mouth every morning. 90 tablet 3  . Cholecalciferol (VITAMIN D3) 1.25 MG (50000 UT) TABS Take 1 tablet by mouth daily.    . clopidogrel (PLAVIX) 75 MG tablet TAKE 1 TABLET BY MOUTH EVERY DAY 90 tablet 3  . FLUoxetine (PROZAC) 20 MG tablet TAKE 1 TABLET BY MOUTH EVERY DAY 90 tablet 0  . insulin lispro (HUMALOG) 100 UNIT/ML cartridge Inject 10-15 Units into the skin 2 (two) times daily. Pt ranges his insulin with his blood sugar readings    . insulin NPH Human (HUMULIN N,NOVOLIN N) 100 UNIT/ML injection Inject 25 Units into the skin 2 (two) times daily before a meal.     . isosorbide mononitrate (IMDUR) 60 MG 24 hr tablet Take 1 tablet (60 mg total) by mouth daily. 90 tablet 3  . metoprolol succinate (TOPROL-XL) 25 MG 24 hr tablet TAKE 1 TABLET BY MOUTH DAILY . TAKE WITH OR IMMEDIATELY FOLLOWING A MEAL 90 tablet 3  . Multiple Vitamins-Minerals (MULTIVITAMIN WITH MINERALS) tablet Take 1 tablet by mouth daily.    . nitroGLYCERIN (NITROSTAT) 0.4 MG SL tablet Place 1 tablet (0.4 mg total) under the tongue every 5 (five) minutes as needed for chest pain (up to 3 doses). 25 tablet 3  . pantoprazole (PROTONIX) 20 MG tablet TAKE 1 TABLET BY MOUTH EVERY DAY 90 tablet 2   No current facility-administered medications for this visit.     Allergies:   Patient has no known allergies.    Social History:  The patient  reports that he has quit smoking. His smoking use included cigarettes. He has never used smokeless tobacco. He  reports that he drinks alcohol. He reports that he does not use drugs.   Family History:  The patient's family history includes CAD in his father; COPD in his brother; Diabetes in his father, mother, sister, and sister; Heart attack in his father.    ROS:  General:no colds or fevers, no weight changes Skin:no rashes or ulcers HEENT:no blurred vision, no congestion CV:see HPI PUL:see HPI GI:no diarrhea constipation or melena, no indigestion GU:no hematuria, no dysuria MS:no joint pain, no claudication Neuro:no syncope, no lightheadedness Endo:+ diabetes, no thyroid disease   Wt Readings from Last 3 Encounters:  04/19/18 188 lb 12.8 oz (85.6 kg)  03/05/18 191 lb (86.6 kg)  02/25/18 189 lb (  85.7 kg)     PHYSICAL EXAM: VS:  BP 118/68   Pulse 69   Ht 5\' 5"  (1.651 m)   Wt 188 lb 12.8 oz (85.6 kg)   SpO2 96%   BMI 31.42 kg/m  , BMI Body mass index is 31.42 kg/m. General:Pleasant affect, NAD Skin:Warm and dry, brisk capillary refill HEENT:normocephalic, sclera clear, mucus membranes moist Neck:supple, no JVD, no bruits  Heart:S1S2 RRR with soft systolic murmur, no gallup, rub or click Lungs:clear without rales, rhonchi, or wheezes MGQ:QPYP, non tender, + BS, do not palpate liver spleen or masses Ext:no lower ext edema, 2+ pedal pulses, 2+ radial pulses Neuro:alert and oriented X 3, MAE, follows commands, + facial symmetry    EKG:  EKG is NOT ordered today.    Recent Labs: 01/29/2018: TSH 3.98 04/19/2018: ALT 18; BUN 17; Creatinine, Ser 1.16; NT-Pro BNP 196; Potassium 5.0; Sodium 139    Lipid Panel    Component Value Date/Time   CHOL 109 04/19/2018 0959   TRIG 48 04/19/2018 0959   HDL 45 04/19/2018 0959   CHOLHDL 2.4 04/19/2018 0959   CHOLHDL 2.4 03/17/2016 0847   VLDL 14 03/17/2016 0847   LDLCALC 54 04/19/2018 0959       Other studies Reviewed: Additional studies/ records that were reviewed today include: . CHEST - 2 VIEW 02/25/18  COMPARISON:  Radiograph  of January 03, 2017.  FINDINGS: The heart size and mediastinal contours are within normal limits. Both lungs are clear. No pneumothorax or pleural effusion is noted. Elevated left hemidiaphragm is noted. The visualized skeletal structures are unremarkable.  IMPRESSION: No active cardiopulmonary disease.  Echo 03/11/18 Study Conclusions  - Left ventricle: The cavity size was normal. There was moderate   concentric hypertrophy. Systolic function was normal. The   estimated ejection fraction was in the range of 60% to 65%.   Although no diagnostic regional wall motion abnormality was   identified, this possibility cannot be completely excluded on the   basis of this study. Doppler parameters are consistent with   abnormal left ventricular relaxation (grade 1 diastolic   dysfunction). - Aortic valve: Trileaflet; moderately thickened, moderately   calcified leaflets. Valve mobility was mildly restricted. There   was very mild stenosis. There was mild to moderate regurgitation. - Mitral valve: There was trivial regurgitation. - Left atrium: The atrium was moderately dilated. - Atrial septum: No defect or patent foramen ovale was identified. - Tricuspid valve: There was trivial regurgitation. - Pulmonic valve: There was trivial regurgitation.  Impressions:  - Normal LV EF without obvious wall motion abnormalities. Wall   motion not ideal in several views, but patient declined echo   contrast. Grade 1 diastolic dysfunction. Thickened aortic valve   with very mild stenosis and mild-moderate regurgitation.  Cardiac cath 01/04/17    Severe calcific coronary artery disease.  Proximal to mid significant LAD obstructive disease in the 70-80% range that extends across a large diagonal. When compared to the prior angiogram from 2016 there has been mild progression in the proximal segment.  Widely patent circumflex coronary artery.  Widely patent right coronary stent deployment in  2016. Eccentric 50-60% mid to distal RCA.  Normal left ventricular function and end diastolic pressures. LVEDP 9 mmHg pre-ventriculography.  RECOMMENDATIONS:   The left anterior descending is significantly obstructed and is heavily calcified. Intervention would be complex including atheroblation with either rotational atherectomy or orbital atherectomy. This would then need to be followed by extremely long segment of stent that will  crossover a very large diagonal branch increase in the risk of acute ischemia. Given normal LV function and hemodynamics with mild progression in disease over 2 years, I would recommend up titrating medical therapy, reserving PCI for unstable angina/ACS if that ever develops.  Will reinstitute the patient's DO NOT RESUSCITATE request which I asked to temporarily rescind to allow cath and appropriate management if complications.      ASSESSMENT AND PLAN:  1.  DOE, I discussed with [name] and she recommended lexiscan myoivew but pt and his son agreed not to pursue at this time.  If symptoms increase he may consider at that time.  He will call if this happens.  2.  significant CAD will leave imdur at 60 mg daily.  Continue asa and plavix and toprol.   3.  Dilated aortic root, on echo 02/2018 was normal in size  4.  HTN controlled  5.  Mild AS continues on current echo with AR   6.  HLD continue statin.    Current medicines are reviewed with the patient today.  The patient Has no concerns regarding medicines.  The following changes have been made:  See above Labs/ tests ordered today include:see above  Disposition:   FU:  see above  Signed, Cecilie Kicks, NP  04/21/2018 1:13 PM    [facility], [place], [address] [place], [place] Phone: [phone]; Fax: [phone]

## 2018-04-19 ENCOUNTER — Encounter: Payer: Self-pay | Admitting: Cardiology

## 2018-04-19 ENCOUNTER — Other Ambulatory Visit: Payer: Medicare Other | Admitting: *Deleted

## 2018-04-19 ENCOUNTER — Ambulatory Visit (INDEPENDENT_AMBULATORY_CARE_PROVIDER_SITE_OTHER): Payer: Medicare Other | Admitting: Cardiology

## 2018-04-19 ENCOUNTER — Encounter: Payer: Self-pay | Admitting: *Deleted

## 2018-04-19 VITALS — BP 118/68 | HR 69 | Ht 65.0 in | Wt 188.8 lb

## 2018-04-19 DIAGNOSIS — I351 Nonrheumatic aortic (valve) insufficiency: Secondary | ICD-10-CM

## 2018-04-19 DIAGNOSIS — E1159 Type 2 diabetes mellitus with other circulatory complications: Secondary | ICD-10-CM | POA: Diagnosis not present

## 2018-04-19 DIAGNOSIS — I10 Essential (primary) hypertension: Secondary | ICD-10-CM | POA: Diagnosis not present

## 2018-04-19 DIAGNOSIS — R0602 Shortness of breath: Secondary | ICD-10-CM

## 2018-04-19 DIAGNOSIS — I2510 Atherosclerotic heart disease of native coronary artery without angina pectoris: Secondary | ICD-10-CM

## 2018-04-19 DIAGNOSIS — E785 Hyperlipidemia, unspecified: Secondary | ICD-10-CM

## 2018-04-19 DIAGNOSIS — I77810 Thoracic aortic ectasia: Secondary | ICD-10-CM

## 2018-04-19 LAB — BASIC METABOLIC PANEL
BUN / CREAT RATIO: 15 (ref 10–24)
BUN: 17 mg/dL (ref 8–27)
CALCIUM: 9.2 mg/dL (ref 8.6–10.2)
CO2: 24 mmol/L (ref 20–29)
Chloride: 100 mmol/L (ref 96–106)
Creatinine, Ser: 1.16 mg/dL (ref 0.76–1.27)
GFR calc Af Amer: 65 mL/min/{1.73_m2} (ref >59)
GFR calc non Af Amer: 56 mL/min/{1.73_m2} — ABNORMAL LOW (ref >59)
GLUCOSE: 160 mg/dL — AB (ref 65–99)
Potassium: 5 mmol/L (ref 3.5–5.2)
Sodium: 139 mmol/L (ref 134–144)

## 2018-04-19 LAB — LIPID PANEL
CHOL/HDL RATIO: 2.4 ratio (ref 0.0–5.0)
Cholesterol, Total: 109 mg/dL (ref 100–199)
HDL: 45 mg/dL (ref >39)
LDL Calculated: 54 mg/dL (ref 0–99)
Triglycerides: 48 mg/dL (ref 0–149)
VLDL Cholesterol Cal: 10 mg/dL (ref 5–40)

## 2018-04-19 LAB — HEPATIC FUNCTION PANEL
ALBUMIN: 4 g/dL (ref 3.5–4.7)
ALK PHOS: 87 IU/L (ref 39–117)
ALT: 18 IU/L (ref 0–44)
AST: 21 IU/L (ref 0–40)
BILIRUBIN TOTAL: 0.3 mg/dL (ref 0.0–1.2)
Bilirubin, Direct: 0.11 mg/dL (ref 0.00–0.40)
Total Protein: 6 g/dL (ref 6.0–8.5)

## 2018-04-19 LAB — PRO B NATRIURETIC PEPTIDE: NT-PRO BNP: 196 pg/mL (ref 0–486)

## 2018-04-19 NOTE — Patient Instructions (Signed)
Medication Instructions:  Your physician recommends that you continue on your current medications as directed. Please refer to the Current Medication list given to you today.  If you need a refill on your cardiac medications before your next appointment, please call your pharmacy.   Lab work: Today -lipids/liver, bmet, Pbnp If you have labs (blood work) drawn today and your tests are completely normal, you will receive your results only by: [name] MyChart Message (if you have MyChart) OR . A paper copy in the mail If you have any lab test that is abnormal or we need to change your treatment, we will call you to review the results.  Testing/Procedures: none  Follow-Up: Your physician recommends that you schedule a follow-up appointment in: 2 months with [name] or APP on Dr. Theodosia Blender care team.   Any Other Special Instructions Will Be Listed Below (If Applicable).

## 2018-04-24 ENCOUNTER — Other Ambulatory Visit: Payer: Self-pay

## 2018-04-24 ENCOUNTER — Encounter (HOSPITAL_COMMUNITY): Payer: Self-pay

## 2018-04-24 ENCOUNTER — Inpatient Hospital Stay (HOSPITAL_COMMUNITY)
Admission: EM | Admit: 2018-04-24 | Discharge: 2018-04-28 | DRG: 378 | Disposition: A | Discharge status: Home / Self Care | Payer: Medicare Other | Attending: Internal Medicine | Admitting: Internal Medicine

## 2018-04-24 DIAGNOSIS — Z23 Encounter for immunization: Secondary | ICD-10-CM

## 2018-04-24 DIAGNOSIS — D696 Thrombocytopenia, unspecified: Secondary | ICD-10-CM | POA: Diagnosis present

## 2018-04-24 DIAGNOSIS — F339 Major depressive disorder, recurrent, unspecified: Secondary | ICD-10-CM | POA: Diagnosis not present

## 2018-04-24 DIAGNOSIS — D649 Anemia, unspecified: Secondary | ICD-10-CM | POA: Diagnosis present

## 2018-04-24 DIAGNOSIS — Z8673 Personal history of transient ischemic attack (TIA), and cerebral infarction without residual deficits: Secondary | ICD-10-CM

## 2018-04-24 DIAGNOSIS — E860 Dehydration: Secondary | ICD-10-CM | POA: Diagnosis present

## 2018-04-24 DIAGNOSIS — K2970 Gastritis, unspecified, without bleeding: Secondary | ICD-10-CM | POA: Diagnosis present

## 2018-04-24 DIAGNOSIS — Z6835 Body mass index (BMI) 35.0-35.9, adult: Secondary | ICD-10-CM

## 2018-04-24 DIAGNOSIS — F329 Major depressive disorder, single episode, unspecified: Secondary | ICD-10-CM | POA: Diagnosis present

## 2018-04-24 DIAGNOSIS — K922 Gastrointestinal hemorrhage, unspecified: Secondary | ICD-10-CM | POA: Diagnosis not present

## 2018-04-24 DIAGNOSIS — N179 Acute kidney failure, unspecified: Secondary | ICD-10-CM | POA: Diagnosis present

## 2018-04-24 DIAGNOSIS — R197 Diarrhea, unspecified: Secondary | ICD-10-CM | POA: Diagnosis not present

## 2018-04-24 DIAGNOSIS — E785 Hyperlipidemia, unspecified: Secondary | ICD-10-CM | POA: Diagnosis present

## 2018-04-24 DIAGNOSIS — Z955 Presence of coronary angioplasty implant and graft: Secondary | ICD-10-CM

## 2018-04-24 DIAGNOSIS — E1165 Type 2 diabetes mellitus with hyperglycemia: Secondary | ICD-10-CM | POA: Diagnosis not present

## 2018-04-24 DIAGNOSIS — N183 Chronic kidney disease, stage 3 unspecified: Secondary | ICD-10-CM | POA: Diagnosis present

## 2018-04-24 DIAGNOSIS — I959 Hypotension, unspecified: Secondary | ICD-10-CM | POA: Diagnosis not present

## 2018-04-24 DIAGNOSIS — E875 Hyperkalemia: Secondary | ICD-10-CM | POA: Diagnosis present

## 2018-04-24 DIAGNOSIS — D62 Acute posthemorrhagic anemia: Secondary | ICD-10-CM | POA: Diagnosis present

## 2018-04-24 DIAGNOSIS — G459 Transient cerebral ischemic attack, unspecified: Secondary | ICD-10-CM | POA: Diagnosis not present

## 2018-04-24 DIAGNOSIS — K449 Diaphragmatic hernia without obstruction or gangrene: Secondary | ICD-10-CM | POA: Diagnosis present

## 2018-04-24 DIAGNOSIS — R531 Weakness: Secondary | ICD-10-CM | POA: Diagnosis not present

## 2018-04-24 DIAGNOSIS — E669 Obesity, unspecified: Secondary | ICD-10-CM | POA: Diagnosis present

## 2018-04-24 DIAGNOSIS — K264 Chronic or unspecified duodenal ulcer with hemorrhage: Secondary | ICD-10-CM | POA: Diagnosis not present

## 2018-04-24 DIAGNOSIS — E872 Acidosis, unspecified: Secondary | ICD-10-CM | POA: Diagnosis present

## 2018-04-24 DIAGNOSIS — K219 Gastro-esophageal reflux disease without esophagitis: Secondary | ICD-10-CM | POA: Diagnosis not present

## 2018-04-24 DIAGNOSIS — I352 Nonrheumatic aortic (valve) stenosis with insufficiency: Secondary | ICD-10-CM | POA: Diagnosis present

## 2018-04-24 DIAGNOSIS — Z7982 Long term (current) use of aspirin: Secondary | ICD-10-CM

## 2018-04-24 DIAGNOSIS — I4510 Unspecified right bundle-branch block: Secondary | ICD-10-CM | POA: Diagnosis not present

## 2018-04-24 DIAGNOSIS — R0902 Hypoxemia: Secondary | ICD-10-CM | POA: Diagnosis not present

## 2018-04-24 DIAGNOSIS — Z7902 Long term (current) use of antithrombotics/antiplatelets: Secondary | ICD-10-CM

## 2018-04-24 DIAGNOSIS — Z87891 Personal history of nicotine dependence: Secondary | ICD-10-CM

## 2018-04-24 DIAGNOSIS — I10 Essential (primary) hypertension: Secondary | ICD-10-CM

## 2018-04-24 DIAGNOSIS — E1129 Type 2 diabetes mellitus with other diabetic kidney complication: Secondary | ICD-10-CM | POA: Diagnosis present

## 2018-04-24 DIAGNOSIS — E1122 Type 2 diabetes mellitus with diabetic chronic kidney disease: Secondary | ICD-10-CM | POA: Diagnosis present

## 2018-04-24 DIAGNOSIS — I2510 Atherosclerotic heart disease of native coronary artery without angina pectoris: Secondary | ICD-10-CM | POA: Diagnosis present

## 2018-04-24 DIAGNOSIS — I77810 Thoracic aortic ectasia: Secondary | ICD-10-CM | POA: Diagnosis present

## 2018-04-24 DIAGNOSIS — I129 Hypertensive chronic kidney disease with stage 1 through stage 4 chronic kidney disease, or unspecified chronic kidney disease: Secondary | ICD-10-CM | POA: Diagnosis present

## 2018-04-24 DIAGNOSIS — Z794 Long term (current) use of insulin: Secondary | ICD-10-CM

## 2018-04-24 DIAGNOSIS — Z79899 Other long term (current) drug therapy: Secondary | ICD-10-CM

## 2018-04-24 LAB — CBC
HCT: 22.7 % — ABNORMAL LOW (ref 39.0–52.0)
HEMATOCRIT: 28.3 % — AB (ref 39.0–52.0)
HEMOGLOBIN: 9.1 g/dL — AB (ref 13.0–17.0)
Hemoglobin: 7.4 g/dL — ABNORMAL LOW (ref 13.0–17.0)
MCH: 30.5 pg (ref 26.0–34.0)
MCH: 30.8 pg (ref 26.0–34.0)
MCHC: 32.2 g/dL (ref 30.0–36.0)
MCHC: 32.6 g/dL (ref 30.0–36.0)
MCV: 95 fL (ref 80.0–100.0)
MCV: 94.6 fL (ref 80.0–100.0)
NRBC: 0 % (ref 0.0–0.2)
PLATELETS: 214 10*3/uL (ref 150–400)
Platelets: 141 10*3/uL — ABNORMAL LOW (ref 150–400)
RBC: 2.98 MIL/uL — ABNORMAL LOW (ref 4.22–5.81)
RBC: 2.4 MIL/uL — ABNORMAL LOW (ref 4.22–5.81)
RDW: 13.2 % (ref 11.5–15.5)
RDW: 13.2 % (ref 11.5–15.5)
WBC: 9.5 10*3/uL (ref 4.0–10.5)
WBC: 6.2 10*3/uL (ref 4.0–10.5)
nRBC: 0 % (ref 0.0–0.2)

## 2018-04-24 LAB — PROTIME-INR
INR: 1.17
Prothrombin Time: 14.8 seconds (ref 11.4–15.2)

## 2018-04-24 LAB — COMPREHENSIVE METABOLIC PANEL
ALBUMIN: 3.3 g/dL — AB (ref 3.5–5.0)
ALK PHOS: 61 U/L (ref 38–126)
ALT: 18 U/L (ref 0–44)
ANION GAP: 10 (ref 5–15)
AST: 20 U/L (ref 15–41)
BUN: 69 mg/dL — ABNORMAL HIGH (ref 8–23)
CALCIUM: 9.3 mg/dL (ref 8.9–10.3)
CHLORIDE: 103 mmol/L (ref 98–111)
CO2: 20 mmol/L — ABNORMAL LOW (ref 22–32)
Creatinine, Ser: 1.4 mg/dL — ABNORMAL HIGH (ref 0.61–1.24)
GFR calc non Af Amer: 44 mL/min — ABNORMAL LOW (ref >60)
GFR, EST AFRICAN AMERICAN: 51 mL/min — AB (ref >60)
GLUCOSE: 450 mg/dL — AB (ref 70–99)
POTASSIUM: 6.5 mmol/L — AB (ref 3.5–5.1)
SODIUM: 133 mmol/L — AB (ref 135–145)
Total Bilirubin: 0.5 mg/dL (ref 0.3–1.2)
Total Protein: 5.7 g/dL — ABNORMAL LOW (ref 6.5–8.1)

## 2018-04-24 LAB — I-STAT TROPONIN, ED: TROPONIN I, POC: 0.01 ng/mL (ref 0.00–0.08)

## 2018-04-24 LAB — POTASSIUM: POTASSIUM: 4.4 mmol/L (ref 3.5–5.1)

## 2018-04-24 LAB — LACTIC ACID, PLASMA
LACTIC ACID, VENOUS: 3.6 mmol/L — AB (ref 0.5–1.9)
Lactic Acid, Venous: 3.4 mmol/L (ref 0.5–1.9)

## 2018-04-24 LAB — GLUCOSE, CAPILLARY: GLUCOSE-CAPILLARY: 191 mg/dL — AB (ref 70–99)

## 2018-04-24 LAB — I-STAT CG4 LACTIC ACID, ED
LACTIC ACID, VENOUS: 4.42 mmol/L — AB (ref 0.5–1.9)
LACTIC ACID, VENOUS: 3.48 mmol/L — AB (ref 0.5–1.9)

## 2018-04-24 LAB — POC OCCULT BLOOD, ED: Fecal Occult Bld: POSITIVE — AB

## 2018-04-24 LAB — PREPARE RBC (CROSSMATCH)

## 2018-04-24 LAB — ABO/RH: ABO/RH(D): O POS

## 2018-04-24 LAB — APTT: APTT: 27 s (ref 24–36)

## 2018-04-24 LAB — BRAIN NATRIURETIC PEPTIDE: B Natriuretic Peptide: 32.4 pg/mL (ref 0.0–100.0)

## 2018-04-24 MED ORDER — SODIUM BICARBONATE 8.4 % IV SOLN
50.0000 meq | Freq: Once | INTRAVENOUS | Status: AC
  Administered 2018-04-24: 50 meq via INTRAVENOUS
  Filled 2018-04-24: qty 50

## 2018-04-24 MED ORDER — NITROGLYCERIN 0.4 MG SL SUBL: 0.4000 mg | SUBLINGUAL_TABLET | SUBLINGUAL | Status: DC | PRN

## 2018-04-24 MED ORDER — SODIUM CHLORIDE 0.9 % IV BOLUS
1000.0000 mL | Freq: Once | INTRAVENOUS | Status: AC
  Administered 2018-04-24: 1000 mL via INTRAVENOUS

## 2018-04-24 MED ORDER — SODIUM CHLORIDE 0.9 % IV SOLN
80.0000 mg | Freq: Once | INTRAVENOUS | Status: AC
  Administered 2018-04-24: 80 mg via INTRAVENOUS
  Filled 2018-04-24: qty 80

## 2018-04-24 MED ORDER — INSULIN ASPART 100 UNIT/ML ~~LOC~~ SOLN
0.0000 [IU] | Freq: Three times a day (TID) | SUBCUTANEOUS | Status: DC
  Administered 2018-04-25: 3 [IU] via SUBCUTANEOUS
  Administered 2018-04-25: 2 [IU] via SUBCUTANEOUS
  Administered 2018-04-25 – 2018-04-26 (×2): 3 [IU] via SUBCUTANEOUS
  Administered 2018-04-26 (×2): 5 [IU] via SUBCUTANEOUS
  Administered 2018-04-27 – 2018-04-28 (×3): 2 [IU] via SUBCUTANEOUS

## 2018-04-24 MED ORDER — ACETAMINOPHEN 325 MG PO TABS
650.0000 mg | ORAL_TABLET | Freq: Four times a day (QID) | ORAL | Status: DC | PRN
  Administered 2018-04-25: 650 mg via ORAL
  Filled 2018-04-24: qty 2

## 2018-04-24 MED ORDER — SODIUM CHLORIDE 0.9 % IV SOLN
INTRAVENOUS | Status: DC
  Administered 2018-04-24 – 2018-04-26 (×3): via INTRAVENOUS

## 2018-04-24 MED ORDER — INSULIN ASPART 100 UNIT/ML IV SOLN
10.0000 [IU] | Freq: Once | INTRAVENOUS | Status: AC
  Administered 2018-04-24: 10 [IU] via INTRAVENOUS
  Filled 2018-04-24: qty 0.1

## 2018-04-24 MED ORDER — CALCIUM GLUCONATE-NACL 2-0.675 GM/100ML-% IV SOLN
2.0000 g | Freq: Once | INTRAVENOUS | Status: AC
  Administered 2018-04-24: 2000 mg via INTRAVENOUS
  Filled 2018-04-24: qty 100

## 2018-04-24 MED ORDER — SODIUM CHLORIDE 0.9 % IV SOLN
8.0000 mg/h | INTRAVENOUS | Status: DC
  Administered 2018-04-24 – 2018-04-25 (×2): 8 mg/h via INTRAVENOUS
  Filled 2018-04-24 (×3): qty 80

## 2018-04-24 MED ORDER — FLUOXETINE HCL 20 MG PO CAPS
20.0000 mg | ORAL_CAPSULE | Freq: Every day | ORAL | Status: DC
  Administered 2018-04-26 – 2018-04-28 (×3): 20 mg via ORAL
  Filled 2018-04-24 (×4): qty 1

## 2018-04-24 MED ORDER — ADULT MULTIVITAMIN W/MINERALS CH
1.0000 | ORAL_TABLET | Freq: Every day | ORAL | Status: DC
  Administered 2018-04-25 – 2018-04-28 (×4): 1 via ORAL
  Filled 2018-04-24 (×4): qty 1

## 2018-04-24 MED ORDER — INFLUENZA VAC SPLIT HIGH-DOSE 0.5 ML IM SUSY
0.5000 mL | PREFILLED_SYRINGE | INTRAMUSCULAR | Status: AC
  Administered 2018-04-25: 0.5 mL via INTRAMUSCULAR
  Filled 2018-04-24 (×2): qty 0.5

## 2018-04-24 MED ORDER — ZOLPIDEM TARTRATE 5 MG PO TABS: 5.0000 mg | ORAL_TABLET | Freq: Every evening | ORAL | Status: DC | PRN

## 2018-04-24 MED ORDER — SODIUM CHLORIDE 0.9% IV SOLUTION
Freq: Once | INTRAVENOUS | Status: AC
  Administered 2018-04-24: 23:00:00 via INTRAVENOUS

## 2018-04-24 MED ORDER — INSULIN NPH (HUMAN) (ISOPHANE) 100 UNIT/ML ~~LOC~~ SUSP
17.0000 [IU] | Freq: Two times a day (BID) | SUBCUTANEOUS | Status: DC
  Filled 2018-04-24: qty 10

## 2018-04-24 MED ORDER — ALBUTEROL SULFATE (2.5 MG/3ML) 0.083% IN NEBU
2.5000 mg | INHALATION_SOLUTION | Freq: Once | RESPIRATORY_TRACT | Status: AC
  Administered 2018-04-24: 2.5 mg via RESPIRATORY_TRACT
  Filled 2018-04-24: qty 3

## 2018-04-24 MED ORDER — FUROSEMIDE 10 MG/ML IJ SOLN
20.0000 mg | Freq: Once | INTRAMUSCULAR | Status: AC
  Administered 2018-04-24: 20 mg via INTRAVENOUS
  Filled 2018-04-24: qty 2

## 2018-04-24 MED ORDER — METOPROLOL SUCCINATE ER 25 MG PO TB24
25.0000 mg | ORAL_TABLET | Freq: Every day | ORAL | Status: DC
  Filled 2018-04-24: qty 1

## 2018-04-24 MED ORDER — ATORVASTATIN CALCIUM 40 MG PO TABS
40.0000 mg | ORAL_TABLET | Freq: Every morning | ORAL | Status: DC
  Administered 2018-04-25 – 2018-04-28 (×4): 40 mg via ORAL
  Filled 2018-04-24 (×4): qty 1

## 2018-04-24 MED ORDER — ISOSORBIDE MONONITRATE ER 60 MG PO TB24
60.0000 mg | ORAL_TABLET | Freq: Every day | ORAL | Status: DC
  Administered 2018-04-26 – 2018-04-28 (×3): 60 mg via ORAL
  Filled 2018-04-24 (×4): qty 1

## 2018-04-24 MED ORDER — ONDANSETRON HCL 4 MG/2ML IJ SOLN: 4.0000 mg | Freq: Three times a day (TID) | INTRAMUSCULAR | Status: DC | PRN

## 2018-04-24 MED ORDER — HYDRALAZINE HCL 20 MG/ML IJ SOLN: 5.0000 mg | INTRAMUSCULAR | Status: DC | PRN

## 2018-04-24 NOTE — Progress Notes (Signed)
Patient admitted from ED alert and oriented. Vitals stable. Denies pain.

## 2018-04-24 NOTE — ED Triage Notes (Signed)
Per [facility], pt from home with complaint of dark colored diarrhea, possibly bloody since this morning. Has frank bloody appearance upon exam by Dr Freddi Che here. BP dropped 30 points from lying to sitting and pt became pale and diaphoretic upon position change. BP 106/49, HR 88, 95% on RA. CBG 410 and hx of DM.

## 2018-04-24 NOTE — H&P (Signed)
History and Physical    Seth Abbott OZH:[id number] DOB: [birth year] DOA: 04/24/2018  Referring MD/NP/PA:   PCP: [name], DO   Patient coming from:  The patient is coming from home.  At baseline, pt is independent for most of ADL.        Chief Complaint: Dark stool  HPI: Seth Abbott is a 84 y.o. male with medical history significant of hypertension, hyperlipidemia, diabetes mellitus, TIA, GERD, depression, aortic stenosis, aortic insufficiency, CAD, stent placement, CKD-3, who presents with dark stool.  Patient states that she started having dark stool bowel movement since early morning.  He has had multiple episodes of dark stool.  Developed the dizziness today.  Patient denies nausea, vomiting, diarrhea or abdominal pain.  No fever or chills.  He has mild shortness of breath which has resolved.  No chest pain, cough.  Denies symptoms of UTI or unilateral weakness.  Patient states that he had colonoscopy and EGD remotely, but he does not remember who did this and what the results was, but likely was negative findings.  ED Course: pt was found to have positive FOBT, hemoglobin dropped from 12.9 on 02/02/18-->9.1-->7.4, INR 1.17, PTT 27, lactic acid 4.42, negative troponin, potassium 6.5, worsening renal function, bicarbonate 20, anion gap 10, blood sugar 450, no tachycardia, slightly tachypnea, oxygen satting 95% on room air, no fever.  Patient is placed on telemetry bed for observation.  [facility] was consulted (ED physician did not write down GI doctor's name).  They will see patient in the morning.  Review of Systems:   General: no fevers, chills, no body weight gain, has fatigue.  Has dizziness HEENT: no blurry vision, hearing changes or sore throat Respiratory: no dyspnea, coughing, wheezing CV: no chest pain, no palpitations GI: no nausea, vomiting, abdominal pain, diarrhea, constipation. Has dark stool. GU: no dysuria, burning on urination, increased urinary frequency,  hematuria  Ext: no leg edema Neuro: no unilateral weakness, numbness, or tingling, no vision change or hearing loss Skin: no rash, no skin tear. MSK: No muscle spasm, no deformity, no limitation of range of movement in spin Heme: No easy bruising.  Travel history: No recent long distant travel.  Allergy: No Known Allergies  Past Medical History:  Diagnosis Date  . Aortic insufficiency    Mild to moderate by echo 02/2018  . Aortic stenosis, mild    by echo 02/2018  . Arthritis    "mostly in my hands" (02/09/2015)  . CAD (coronary artery disease)    a. 12/2014 cath showed 95% prox RCA and diffuse calcified prox to distal LAD disease up to 70% at multiple spots. S/p rotational atherectomy/DES to prox RCA 02/09/15. Repeat cath 01/04/2017 showed 70-80% prox to mid LAD stenosis with mild progression since 2016, patent LCx, patent RCA stent and 50-60% mid to distal RCA with normal LVF and normal LVEDP.  Medical management was recommended unless he ha  . Dilated aortic root ([place])    89mm by echo 12/2016  . GERD (gastroesophageal reflux disease)   . HTN (hypertension)   . Hyperlipidemia   . Prostatitis   . Shingles   . TIA (transient ischemic attack) early 1990's; 09/2006   [name] 10/26/2010  . Type II diabetes mellitus ([place])     Past Surgical History:  Procedure Laterality Date  . CARDIAC CATHETERIZATION N/A 12/11/2014   Procedure: Left Heart Cath and Coronary Angiography;  Surgeon: Belva Crome, MD;  Location: [place] [facility];  Service: Cardiovascular;  Laterality:  N/A;  . CARDIAC CATHETERIZATION N/A 02/09/2015   Procedure: Coronary Stent Intervention-Rotoblator, temp pacer;  Surgeon: Belva Crome, MD;  Location: [place] [facility];  Service: Cardiovascular;  Laterality: N/A;  . CATARACT EXTRACTION W/ INTRAOCULAR LENS  IMPLANT, BILATERAL Bilateral   . EXCISIONAL HEMORRHOIDECTOMY  1957  . EYE SURGERY    . LEFT HEART CATH AND CORONARY ANGIOGRAPHY N/A 01/04/2017   Procedure: Left Heart Cath  and Coronary Angiography;  Surgeon: Belva Crome, MD;  Location: [place] [facility];  Service: Cardiovascular;  Laterality: N/A;  . MEMBRANE PEEL Left 01/11/2015   eye    Social History:  reports that he has quit smoking. His smoking use included cigarettes. He has never used smokeless tobacco. He reports that he drinks alcohol. He reports that he does not use drugs.  Family History:  Family History  Problem Relation Age of Onset  . Diabetes Mother   . Heart attack Father   . Diabetes Father   . CAD Father   . Diabetes Sister   . COPD Brother   . Diabetes Sister      Prior to Admission medications   Medication Sig Start Date End Date Taking? Authorizing Provider  aspirin 81 MG tablet Take 81 mg by mouth every morning.    Yes [provider]  atorvastatin (LIPITOR) 40 MG tablet Take 1 tablet (40 mg total) by mouth every morning. 03/05/18  Yes Turner, Eber Hong, MD  clopidogrel (PLAVIX) 75 MG tablet TAKE 1 TABLET BY MOUTH EVERY DAY 12/14/17  Yes Turner, Traci R, MD  FLUoxetine (PROZAC) 20 MG tablet TAKE 1 TABLET BY MOUTH EVERY DAY 04/11/18  Yes Opalski, Deborah, DO  insulin lispro (HUMALOG) 100 UNIT/ML cartridge Inject 10-15 Units into the skin 2 (two) times daily. Pt ranges his insulin with his blood sugar readings   Yes [provider]  insulin NPH Human (HUMULIN N,NOVOLIN N) 100 UNIT/ML injection Inject 25 Units into the skin 2 (two) times daily before a meal.    Yes [provider]  isosorbide mononitrate (IMDUR) 60 MG 24 hr tablet Take 1 tablet (60 mg total) by mouth daily. 03/05/18  Yes Turner, Eber Hong, MD  metoprolol succinate (TOPROL-XL) 25 MG 24 hr tablet TAKE 1 TABLET BY MOUTH DAILY . TAKE WITH OR IMMEDIATELY FOLLOWING A MEAL 03/29/18  Yes Turner, Eber Hong, MD  Multiple Vitamins-Minerals (MULTIVITAMIN WITH MINERALS) tablet Take 1 tablet by mouth daily.   Yes [provider]  nitroGLYCERIN (NITROSTAT) 0.4 MG SL tablet Place 1 tablet (0.4 mg total)  under the tongue every 5 (five) minutes as needed for chest pain (up to 3 doses). 02/10/15  Yes Dunn, Dayna N, PA-C  pantoprazole (PROTONIX) 20 MG tablet TAKE 1 TABLET BY MOUTH EVERY DAY 12/18/17  Yes Turner, Eber Hong, MD  ACCU-CHEK AVIVA PLUS test strip USE TO CHECK BLOOD SUGAR 3 TIMES DAILY 02/21/16   [provider]    Physical Exam: Vitals:   04/24/18 1920 04/24/18 2030 04/24/18 2045 04/24/18 2115  BP:      Pulse:  99 98 97  Resp:  17 (!) 26 (!) 22  SpO2: 94% 100% 100% 100%  Weight:      Height:       General: Not in acute distress. Pale looking. HEENT:       Eyes: PERRL, EOMI, no scleral icterus.       ENT: No discharge from the ears and nose, no pharynx injection, no tonsillar enlargement.  Neck: No JVD, no bruit, no mass felt. Heme: No neck lymph node enlargement. Cardiac: U7/M5, RRR, 2/6 systolic murmurs, No gallops or rubs. Respiratory: No rales, wheezing, rhonchi or rubs. GI: Soft, nondistended, nontender, no rebound pain, no organomegaly, BS present. GU: No hematuria Ext: No pitting leg edema bilaterally. 2+DP/PT pulse bilaterally. Musculoskeletal: No joint deformities, No joint redness or warmth, no limitation of ROM in spin. Skin: No rashes.  Neuro: Alert, oriented X3, cranial nerves II-XII grossly intact, moves all extremities normally.  Psych: Patient is not psychotic, no suicidal or hemocidal ideation.  Labs on Admission: I have personally reviewed following labs and imaging studies  CBC: Recent Labs  Lab 04/24/18 1755 04/24/18 2115  WBC 9.5 6.2  HGB 9.1* 7.4*  HCT 28.3* 22.7*  MCV 95.0 94.6  PLT 214 465*   Basic Metabolic Panel: Recent Labs  Lab 04/19/18 0959 04/24/18 1755  NA 139 133*  K 5.0 6.5*  CL 100 103  CO2 24 20*  GLUCOSE 160* 450*  BUN 17 69*  CREATININE 1.16 1.40*  CALCIUM 9.2 9.3   GFR: Estimated Creatinine Clearance: 37.4 mL/min (A) (by C-G formula based on SCr of 1.4 mg/dL (H)). Liver Function Tests: Recent Labs    Lab 04/19/18 0959 04/24/18 1755  AST 21 20  ALT 18 18  ALKPHOS 87 61  BILITOT 0.3 0.5  PROT 6.0 5.7*  ALBUMIN 4.0 3.3*   No results for input(s): LIPASE, AMYLASE in the last 168 hours. No results for input(s): AMMONIA in the last 168 hours. Coagulation Profile: Recent Labs  Lab 04/24/18 1758  INR 1.17   Cardiac Enzymes: No results for input(s): CKTOTAL, CKMB, CKMBINDEX, TROPONINI in the last 168 hours. BNP (last 3 results) Recent Labs    04/19/18 0959  PROBNP 196   HbA1C: No results for input(s): HGBA1C in the last 72 hours. CBG: No results for input(s): GLUCAP in the last 168 hours. Lipid Profile: No results for input(s): CHOL, HDL, LDLCALC, TRIG, CHOLHDL, LDLDIRECT in the last 72 hours. Thyroid Function Tests: No results for input(s): TSH, T4TOTAL, FREET4, T3FREE, THYROIDAB in the last 72 hours. Anemia Panel: No results for input(s): VITAMINB12, FOLATE, FERRITIN, TIBC, IRON, RETICCTPCT in the last 72 hours. Urine analysis:    Component Value Date/Time   COLORURINE YELLOW 01/02/2017 1940   APPEARANCEUR CLEAR 01/02/2017 1940   LABSPEC 1.023 01/02/2017 1940   PHURINE 6.0 01/02/2017 1940   GLUCOSEU >=500 (A) 01/02/2017 1940   HGBUR SMALL (A) 01/02/2017 1940   BILIRUBINUR NEGATIVE 01/02/2017 1940   KETONESUR 5 (A) 01/02/2017 1940   PROTEINUR 100 (A) 01/02/2017 1940   UROBILINOGEN 1.0 10/12/2013 1954   NITRITE NEGATIVE 01/02/2017 1940   LEUKOCYTESUR NEGATIVE 01/02/2017 1940   Sepsis Labs: @LABRCNTIP (procalcitonin:4,lacticidven:4) )No results found for this or any previous visit (from the past 240 hour(s)).   Radiological Exams on Admission: No results found.   EKG: Independently reviewed.  Sinus rhythm, QTC 482, right bundle blockade, RAD, early R wave progression, nonspecific T wave change.   Assessment/Plan Principal Problem:   GIB (gastrointestinal bleeding) Active Problems:   Depression, recurrent (HCC)   CAD (coronary artery disease), native  coronary artery   Hyperlipidemia LDL goal <70   HTN (hypertension)   Type II diabetes mellitus with renal manifestations (HCC)   TIA (transient ischemic attack)   GERD (gastroesophageal reflux disease)   Acute renal failure superimposed on stage 3 chronic kidney disease (HCC)   Hyperkalemia   Lactic acidosis   Symptomatic anemia  GIB and symptomatic anemia due to blood loss: Hemoglobin dropped from 12.9 on 8/410/19 to 9.1, then further dropped to 7.4, indicating patient may continue to bleed.  Currently hemodynamically stable. Pt had colonoscopy and EGD remotely, but he does not remember who did this and what the results was, but likely was negative findings per pt. Now has dark stool, indicating possible upper GI bleeding.  Patient is taking aspirin and Lasix which will be on hold.  - will place in SDU for obs - transfuse 1 units of blood now - [facility] consulted by Ed, will follow up recommendations  - NPO for possible EGD - IVF:3L NS bolus, then at 75 mL/hr - Start IV pantoprazole gtt - hold ASA and plavix - Zofran IV for nausea - Avoid NSAIDs and SQ heparin - Maintain IV access (2 large bore IVs if possible). - Monitor closely and follow q6h cbc, transfuse as necessary, if Hgb<7.0 - [facility]: INR, PTT and type screen  Hyperkalemia: K=6.6.  Cannot be treated with Kayexalate due to GI bleeding. -treated with 2 g of calcium gluconate, 50 mEq of bicarbonate, albuterol nebulizers, 10 units of NovoLog. -will give 1 dose of Lasix 20 mg x 1 after giving 3 L normal saline bolus -repeat K level at 22:30   Depression, recurrent (HCC): -Prozac  CAD (coronary artery disease), native coronary artery: s/p of stent. No CP -hold asa and plavix -on Imdure, prn NTG -lipitor  Hyperlipidemia LDL goal <70: -Lipitor  HTN (hypertension): -Continue metoprolol -IV hydralazine.  Type II diabetes mellitus with renal manifestations ([place]): Last A1c 7.6 on 01/29/18, not well controled. Patient is  taking Humalog and NPH insulin at home -will decrease NPH insulin from 25 to 17 units twice daily -SSI  Hx of TIA (transient ischemic attack): -hold aspirin and Plavix -Continue Lipitor  GERD (gastroesophageal reflux disease): -on IV protonix  AoCKD-III: Baseline Cre is 1.0 , pt's Cre is 1.40 and BUN 69 on admission. Likely due to volume depletion - IVF as above - Follow up renal function by BMP  Lactic acidosis: lactic acid 4.42.  Likely due to volume depletion and hypoperfusion.  No signs of infection. -IV fluid as above -Trend lactic acid level.    DVT ppx: SCD Code Status: [facility] Family Communication:    Yes, patient's 2 sons  at bed side Disposition Plan:  Anticipate discharge back to previous home environment Consults called: [facility] Admission status: Obs / tele    Date of Service 04/24/2018    Ivor Costa [facility] Pager [phone]  If 7PM-7AM, please contact night-coverage [email or web address] Password [email or web address] 04/24/2018, 10:10 PM

## 2018-04-24 NOTE — ED Provider Notes (Signed)
[facility] Emergency Department Provider Note MRN:  [id number]  Arrival date & time: 04/24/18     Chief Complaint   Diarrhea and Blood In Stools   History of Present Illness   Seth Abbott is a 84 y.o. year-old male with a history of aortic stenosis, CAD, hypertension presenting to the ED with chief complaint of blood in stool.  Patient endorsing multiple episodes of dark or black-colored diarrhea since yesterday.  Persistent symptoms.  Associated with mild lower abdominal cramping pain which is intermittent.  Today began to feel dizzy and lightheaded, per report was orthostatic with EMS.  Patient takes Plavix daily, last took this morning.  Patient denies any fevers or chills, no headache or vision change, no upper abdominal pain, no vomiting, no dysuria.  Review of Systems  A complete 10 system review of systems was obtained and all systems are negative except as noted in the HPI and PMH.   Patient's Health History    Past Medical History:  Diagnosis Date  . Aortic insufficiency    Mild to moderate by echo 02/2018  . Aortic stenosis, mild    by echo 02/2018  . Arthritis    "mostly in my hands" (02/09/2015)  . CAD (coronary artery disease)    a. 12/2014 cath showed 95% prox RCA and diffuse calcified prox to distal LAD disease up to 70% at multiple spots. S/p rotational atherectomy/DES to prox RCA 02/09/15. Repeat cath 01/04/2017 showed 70-80% prox to mid LAD stenosis with mild progression since 2016, patent LCx, patent RCA stent and 50-60% mid to distal RCA with normal LVF and normal LVEDP.  Medical management was recommended unless he ha  . Dilated aortic root ([place])    69mm by echo 12/2016  . GERD (gastroesophageal reflux disease)   . HTN (hypertension)   . Hyperlipidemia   . Prostatitis   . Shingles   . TIA (transient ischemic attack) early 1990's; 09/2006   [name] 10/26/2010  . Type II diabetes mellitus ([place])     Past Surgical History:  Procedure Laterality  Date  . CARDIAC CATHETERIZATION N/A 12/11/2014   Procedure: Left Heart Cath and Coronary Angiography;  Surgeon: Belva Crome, MD;  Location: [place] [facility];  Service: Cardiovascular;  Laterality: N/A;  . CARDIAC CATHETERIZATION N/A 02/09/2015   Procedure: Coronary Stent Intervention-Rotoblator, temp pacer;  Surgeon: Belva Crome, MD;  Location: [place] [facility];  Service: Cardiovascular;  Laterality: N/A;  . CATARACT EXTRACTION W/ INTRAOCULAR LENS  IMPLANT, BILATERAL Bilateral   . EXCISIONAL HEMORRHOIDECTOMY  1957  . EYE SURGERY    . LEFT HEART CATH AND CORONARY ANGIOGRAPHY N/A 01/04/2017   Procedure: Left Heart Cath and Coronary Angiography;  Surgeon: Belva Crome, MD;  Location: [place] [facility];  Service: Cardiovascular;  Laterality: N/A;  . MEMBRANE PEEL Left 01/11/2015   eye    Family History  Problem Relation Age of Onset  . Diabetes Mother   . Heart attack Father   . Diabetes Father   . CAD Father   . Diabetes Sister   . COPD Brother   . Diabetes Sister     Social History   Socioeconomic History  . Marital status: Widowed    Spouse name: Not on file  . Number of children: Not on file  . Years of education: Not on file  . Highest education level: Not on file  Occupational History  . Occupation: retired  Scientific laboratory technician  . Financial resource strain:  Not on file  . Food insecurity:    Worry: Not on file    Inability: Not on file  . Transportation needs:    Medical: Not on file    Non-medical: Not on file  Tobacco Use  . Smoking status: Former Smoker    Types: Cigarettes  . Smokeless tobacco: Never Used  . Tobacco comment: "haven't smoked since I was 15" tried it once  Substance and Sexual Activity  . Alcohol use: Yes    Alcohol/week: 0.0 standard drinks    Comment: 02/09/2015 "might have a sip of wne a dozen times/yr"  . Drug use: No  . Sexual activity: Not Currently  Lifestyle  . Physical activity:    Days per week: Not on file    Minutes per session: Not  on file  . Stress: Not on file  Relationships  . Social connections:    Talks on phone: Not on file    Gets together: Not on file    Attends religious service: Not on file    Active member of club or organization: Not on file    Attends meetings of clubs or organizations: Not on file    Relationship status: Not on file  . Intimate partner violence:    Fear of current or ex partner: Not on file    Emotionally abused: Not on file    Physically abused: Not on file    Forced sexual activity: Not on file  Other Topics Concern  . Not on file  Social History Narrative  . Not on file     Physical Exam  Vital Signs and Nursing Notes reviewed Vitals:   04/24/18 2300 04/24/18 2306  BP: (!) 104/59 110/68  Pulse: 93 93  Resp: 18 (!) 21  Temp: 97.8 F (36.6 C)   SpO2: 100% 100%    CONSTITUTIONAL: Well-appearing, NAD NEURO:  Alert and oriented x 3, no focal deficits EYES:  eyes equal and reactive ENT/NECK:  no LAD, no JVD CARDIO: Regular rate, well-perfused, normal S1 and S2 PULM:  CTAB no wheezing or rhonchi GI/GU:  normal bowel sounds, non-distended, non-tender, Frank melena on rectal exam MSK/SPINE:  No gross deformities, no edema SKIN:  no rash, atraumatic PSYCH:  Appropriate speech and behavior  Diagnostic and Interventional Summary    EKG Interpretation  Date/Time:  Wednesday April 24 2018 17:43:14 EST Ventricular Rate:  82 PR Interval:    QRS Duration: 137 QT Interval:  412 QTC Calculation: 482 R Axis:   121 Text Interpretation:  Sinus rhythm RBBB and LPFB Baseline wander in lead(s) V2 V6 Confirmed by Gerlene Fee [phone]) on 04/24/2018 6:31:14 PM      Labs Reviewed  COMPREHENSIVE METABOLIC PANEL - Abnormal; Notable for the following components:      Result Value   Sodium 133 (*)    Potassium 6.5 (*)    CO2 20 (*)    Glucose, Bld 450 (*)    BUN 69 (*)    Creatinine, Ser 1.40 (*)    Total Protein 5.7 (*)    Albumin 3.3 (*)    GFR calc non Af Amer 44 (*)     GFR calc Af Amer 51 (*)    All other components within normal limits  CBC - Abnormal; Notable for the following components:   RBC 2.98 (*)    Hemoglobin 9.1 (*)    HCT 28.3 (*)    All other components within normal limits  LACTIC ACID, PLASMA - Abnormal; Notable for  the following components:   Lactic Acid, Venous 3.6 (*)    All other components within normal limits  LACTIC ACID, PLASMA - Abnormal; Notable for the following components:   Lactic Acid, Venous 3.4 (*)    All other components within normal limits  CBC - Abnormal; Notable for the following components:   RBC 2.40 (*)    Hemoglobin 7.4 (*)    HCT 22.7 (*)    Platelets 141 (*)    All other components within normal limits  GLUCOSE, CAPILLARY - Abnormal; Notable for the following components:   Glucose-Capillary 191 (*)    All other components within normal limits  POC OCCULT BLOOD, ED - Abnormal; Notable for the following components:   Fecal Occult Bld POSITIVE (*)    All other components within normal limits  I-STAT CG4 LACTIC ACID, ED - Abnormal; Notable for the following components:   Lactic Acid, Venous 4.42 (*)    All other components within normal limits  I-STAT CG4 LACTIC ACID, ED - Abnormal; Notable for the following components:   Lactic Acid, Venous 3.48 (*)    All other components within normal limits  PROTIME-INR  APTT  BRAIN NATRIURETIC PEPTIDE  POTASSIUM  CBC  CBC  CBC  I-STAT TROPONIN, ED  TYPE AND SCREEN  ABO/RH  PREPARE RBC (CROSSMATCH)    No orders to display    Medications  pantoprazole (PROTONIX) 80 mg in sodium chloride 0.9 % 250 mL (0.32 mg/mL) infusion (8 mg/hr Intravenous New Bag/Given 04/24/18 1931)  atorvastatin (LIPITOR) tablet 40 mg (has no administration in time range)  isosorbide mononitrate (IMDUR) 24 hr tablet 60 mg (has no administration in time range)  metoprolol succinate (TOPROL-XL) 24 hr tablet 25 mg (has no administration in time range)  nitroGLYCERIN (NITROSTAT) SL  tablet 0.4 mg (has no administration in time range)  FLUoxetine (PROZAC) capsule 20 mg (has no administration in time range)  insulin NPH Human (HUMULIN N,NOVOLIN N) injection 17 Units (has no administration in time range)  multivitamin with minerals tablet 1 tablet (has no administration in time range)  insulin aspart (novoLOG) injection 0-9 Units (has no administration in time range)  ondansetron (ZOFRAN) injection 4 mg (has no administration in time range)  hydrALAZINE (APRESOLINE) injection 5 mg (has no administration in time range)  acetaminophen (TYLENOL) tablet 650 mg (has no administration in time range)  zolpidem (AMBIEN) tablet 5 mg (has no administration in time range)  0.9 %  sodium chloride infusion ( Intravenous New Bag/Given 04/24/18 2303)  Influenza vac split quadrivalent PF (FLUZONE HIGH-DOSE) injection 0.5 mL (has no administration in time range)  sodium chloride 0.9 % bolus 1,000 mL (0 mLs Intravenous Stopped 04/24/18 1909)  pantoprazole (PROTONIX) 80 mg in sodium chloride 0.9 % 100 mL IVPB (0 mg Intravenous Stopped 04/24/18 1916)  sodium chloride 0.9 % bolus 1,000 mL (0 mLs Intravenous Stopped 04/24/18 1944)  insulin aspart (novoLOG) injection 10 Units (10 Units Intravenous Given 04/24/18 1945)  albuterol (PROVENTIL) (2.5 MG/3ML) 0.083% nebulizer solution 2.5 mg (2.5 mg Nebulization Given 04/24/18 1919)  sodium bicarbonate injection 50 mEq (50 mEq Intravenous Given 04/24/18 1955)  calcium gluconate 2 g/ 100 mL sodium chloride IVPB (0 g Intravenous Stopped 04/24/18 2111)  furosemide (LASIX) injection 20 mg (20 mg Intravenous Given 04/24/18 2258)  sodium chloride 0.9 % bolus 1,000 mL (0 mLs Intravenous Stopped 04/24/18 2111)  0.9 %  sodium chloride infusion (Manually program via Guardrails IV Fluids) ( Intravenous New Bag/Given 04/24/18 2304)  Procedures Critical Care Critical Care Documentation Critical care time provided by me (excluding procedures): 35  minutes  Condition necessitating critical care: Upper GI bleed  Components of critical care management: reviewing of prior records, laboratory and imaging interpretation, frequent re-examination and reassessment of vital signs, administration of IV fluid resuscitation, IV Protonix, discussion with consulting services    ED Course and Medical Decision Making  I have reviewed the triage vital signs and the nursing notes.  Pertinent labs & imaging results that were available during my care of the patient were reviewed by me and considered in my medical decision making (see below for details).  GI bleed in this 84 year old male, history of CAD on Plavix.  Orthostatic with EMS, here with heart rate and blood pressure within normal limits.  Evident melena on exam, abdomen is nontender and nondistended.  Anticipating admission, work-up pending.  Labs reveal hemoglobin dropped from 12 to 9, market elevation in BUN, mild AKI.  Vital signs stable, GI consulted for endoscopic evaluation, admitted to hospitalist service stepdown unit for further care.  Barth Kirks. [place]onia Small, [place] [email or web address]  Final Clinical Impressions(s) / ED Diagnoses     ICD-10-CM   1. Gastrointestinal hemorrhage, unspecified gastrointestinal hemorrhage type K92.2     ED Discharge Orders    None         Maudie Flakes, MD 04/24/18 2357

## 2018-04-24 NOTE — Plan of Care (Signed)

## 2018-04-25 ENCOUNTER — Encounter (HOSPITAL_COMMUNITY): Payer: Self-pay

## 2018-04-25 ENCOUNTER — Observation Stay (HOSPITAL_COMMUNITY): Payer: Medicare Other | Admitting: Anesthesiology

## 2018-04-25 ENCOUNTER — Encounter (HOSPITAL_COMMUNITY)
Admission: EM | Disposition: A | Discharge status: Home / Self Care | Payer: Self-pay | Source: Home / Self Care | Attending: Internal Medicine

## 2018-04-25 DIAGNOSIS — Z79899 Other long term (current) drug therapy: Secondary | ICD-10-CM | POA: Diagnosis not present

## 2018-04-25 DIAGNOSIS — I77810 Thoracic aortic ectasia: Secondary | ICD-10-CM | POA: Diagnosis present

## 2018-04-25 DIAGNOSIS — E872 Acidosis: Secondary | ICD-10-CM | POA: Diagnosis present

## 2018-04-25 DIAGNOSIS — Z8673 Personal history of transient ischemic attack (TIA), and cerebral infarction without residual deficits: Secondary | ICD-10-CM | POA: Diagnosis not present

## 2018-04-25 DIAGNOSIS — Z7982 Long term (current) use of aspirin: Secondary | ICD-10-CM | POA: Diagnosis not present

## 2018-04-25 DIAGNOSIS — I2510 Atherosclerotic heart disease of native coronary artery without angina pectoris: Secondary | ICD-10-CM | POA: Diagnosis present

## 2018-04-25 DIAGNOSIS — K264 Chronic or unspecified duodenal ulcer with hemorrhage: Secondary | ICD-10-CM | POA: Diagnosis present

## 2018-04-25 DIAGNOSIS — Z955 Presence of coronary angioplasty implant and graft: Secondary | ICD-10-CM | POA: Diagnosis not present

## 2018-04-25 DIAGNOSIS — Z87891 Personal history of nicotine dependence: Secondary | ICD-10-CM | POA: Diagnosis not present

## 2018-04-25 DIAGNOSIS — I129 Hypertensive chronic kidney disease with stage 1 through stage 4 chronic kidney disease, or unspecified chronic kidney disease: Secondary | ICD-10-CM | POA: Diagnosis present

## 2018-04-25 DIAGNOSIS — K2951 Unspecified chronic gastritis with bleeding: Secondary | ICD-10-CM | POA: Diagnosis not present

## 2018-04-25 DIAGNOSIS — K922 Gastrointestinal hemorrhage, unspecified: Secondary | ICD-10-CM | POA: Diagnosis not present

## 2018-04-25 DIAGNOSIS — Z7902 Long term (current) use of antithrombotics/antiplatelets: Secondary | ICD-10-CM | POA: Diagnosis not present

## 2018-04-25 DIAGNOSIS — K921 Melena: Secondary | ICD-10-CM | POA: Diagnosis not present

## 2018-04-25 DIAGNOSIS — I10 Essential (primary) hypertension: Secondary | ICD-10-CM | POA: Diagnosis not present

## 2018-04-25 DIAGNOSIS — E1122 Type 2 diabetes mellitus with diabetic chronic kidney disease: Secondary | ICD-10-CM | POA: Diagnosis present

## 2018-04-25 DIAGNOSIS — D696 Thrombocytopenia, unspecified: Secondary | ICD-10-CM | POA: Diagnosis present

## 2018-04-25 DIAGNOSIS — Z23 Encounter for immunization: Secondary | ICD-10-CM | POA: Diagnosis not present

## 2018-04-25 DIAGNOSIS — K219 Gastro-esophageal reflux disease without esophagitis: Secondary | ICD-10-CM | POA: Diagnosis present

## 2018-04-25 DIAGNOSIS — E785 Hyperlipidemia, unspecified: Secondary | ICD-10-CM | POA: Diagnosis present

## 2018-04-25 DIAGNOSIS — F339 Major depressive disorder, recurrent, unspecified: Secondary | ICD-10-CM | POA: Diagnosis not present

## 2018-04-25 DIAGNOSIS — G459 Transient cerebral ischemic attack, unspecified: Secondary | ICD-10-CM | POA: Diagnosis not present

## 2018-04-25 DIAGNOSIS — I352 Nonrheumatic aortic (valve) stenosis with insufficiency: Secondary | ICD-10-CM | POA: Diagnosis present

## 2018-04-25 DIAGNOSIS — K269 Duodenal ulcer, unspecified as acute or chronic, without hemorrhage or perforation: Secondary | ICD-10-CM | POA: Diagnosis not present

## 2018-04-25 DIAGNOSIS — D62 Acute posthemorrhagic anemia: Secondary | ICD-10-CM | POA: Diagnosis present

## 2018-04-25 DIAGNOSIS — K5730 Diverticulosis of large intestine without perforation or abscess without bleeding: Secondary | ICD-10-CM | POA: Diagnosis not present

## 2018-04-25 DIAGNOSIS — N183 Chronic kidney disease, stage 3 (moderate): Secondary | ICD-10-CM | POA: Diagnosis present

## 2018-04-25 DIAGNOSIS — D649 Anemia, unspecified: Secondary | ICD-10-CM | POA: Diagnosis not present

## 2018-04-25 DIAGNOSIS — K2970 Gastritis, unspecified, without bleeding: Secondary | ICD-10-CM | POA: Diagnosis present

## 2018-04-25 DIAGNOSIS — E1129 Type 2 diabetes mellitus with other diabetic kidney complication: Secondary | ICD-10-CM | POA: Diagnosis not present

## 2018-04-25 DIAGNOSIS — E860 Dehydration: Secondary | ICD-10-CM | POA: Diagnosis present

## 2018-04-25 DIAGNOSIS — K449 Diaphragmatic hernia without obstruction or gangrene: Secondary | ICD-10-CM | POA: Diagnosis present

## 2018-04-25 DIAGNOSIS — F329 Major depressive disorder, single episode, unspecified: Secondary | ICD-10-CM | POA: Diagnosis present

## 2018-04-25 DIAGNOSIS — E875 Hyperkalemia: Secondary | ICD-10-CM | POA: Diagnosis present

## 2018-04-25 DIAGNOSIS — T287XXA Corrosion of other parts of alimentary tract, initial encounter: Secondary | ICD-10-CM | POA: Diagnosis not present

## 2018-04-25 HISTORY — PX: BIOPSY: SHX5522

## 2018-04-25 HISTORY — PX: ESOPHAGOGASTRODUODENOSCOPY (EGD) WITH PROPOFOL: SHX5813

## 2018-04-25 LAB — GLUCOSE, CAPILLARY
GLUCOSE-CAPILLARY: 234 mg/dL — AB (ref 70–99)
GLUCOSE-CAPILLARY: 252 mg/dL — AB (ref 70–99)
Glucose-Capillary: 178 mg/dL — ABNORMAL HIGH (ref 70–99)
Glucose-Capillary: 303 mg/dL — ABNORMAL HIGH (ref 70–99)

## 2018-04-25 LAB — BASIC METABOLIC PANEL
Anion gap: 6 (ref 5–15)
BUN: 40 mg/dL — AB (ref 8–23)
CALCIUM: 8 mg/dL — AB (ref 8.9–10.3)
CHLORIDE: 110 mmol/L (ref 98–111)
CO2: 22 mmol/L (ref 22–32)
CREATININE: 1.3 mg/dL — AB (ref 0.61–1.24)
GFR calc non Af Amer: 48 mL/min — ABNORMAL LOW (ref >60)
GFR, EST AFRICAN AMERICAN: 55 mL/min — AB (ref >60)
Glucose, Bld: 231 mg/dL — ABNORMAL HIGH (ref 70–99)
Potassium: 3.7 mmol/L (ref 3.5–5.1)
Sodium: 138 mmol/L (ref 135–145)

## 2018-04-25 LAB — CBC
HCT: 25.7 % — ABNORMAL LOW (ref 39.0–52.0)
HEMATOCRIT: 25.2 % — AB (ref 39.0–52.0)
Hemoglobin: 8.4 g/dL — ABNORMAL LOW (ref 13.0–17.0)
Hemoglobin: 8.3 g/dL — ABNORMAL LOW (ref 13.0–17.0)
MCH: 30.2 pg (ref 26.0–34.0)
MCH: 30.5 pg (ref 26.0–34.0)
MCHC: 32.7 g/dL (ref 30.0–36.0)
MCHC: 32.9 g/dL (ref 30.0–36.0)
MCV: 92.4 fL (ref 80.0–100.0)
MCV: 92.6 fL (ref 80.0–100.0)
NRBC: 0 % (ref 0.0–0.2)
NRBC: 0 % (ref 0.0–0.2)
PLATELETS: 150 10*3/uL (ref 150–400)
PLATELETS: 146 10*3/uL — AB (ref 150–400)
RBC: 2.78 MIL/uL — AB (ref 4.22–5.81)
RBC: 2.72 MIL/uL — ABNORMAL LOW (ref 4.22–5.81)
RDW: 13.9 % (ref 11.5–15.5)
RDW: 14.2 % (ref 11.5–15.5)
WBC: 6 10*3/uL (ref 4.0–10.5)
WBC: 6.3 10*3/uL (ref 4.0–10.5)

## 2018-04-25 LAB — LACTIC ACID, PLASMA: Lactic Acid, Venous: 1.7 mmol/L (ref 0.5–1.9)

## 2018-04-25 SURGERY — ESOPHAGOGASTRODUODENOSCOPY (EGD) WITH PROPOFOL
Anesthesia: Monitor Anesthesia Care

## 2018-04-25 MED ORDER — PANTOPRAZOLE SODIUM 40 MG PO TBEC
40.0000 mg | DELAYED_RELEASE_TABLET | Freq: Two times a day (BID) | ORAL | Status: DC
  Administered 2018-04-26 – 2018-04-28 (×5): 40 mg via ORAL
  Filled 2018-04-25 (×6): qty 1

## 2018-04-25 MED ORDER — PROPOFOL 10 MG/ML IV BOLUS
INTRAVENOUS | Status: DC | PRN
  Administered 2018-04-25: 20 mg via INTRAVENOUS

## 2018-04-25 MED ORDER — LACTATED RINGERS IV SOLN: INTRAVENOUS | Status: DC | PRN

## 2018-04-25 MED ORDER — METOPROLOL SUCCINATE ER 25 MG PO TB24
25.0000 mg | ORAL_TABLET | Freq: Every day | ORAL | Status: DC
  Filled 2018-04-25: qty 1

## 2018-04-25 MED ORDER — PROPOFOL 500 MG/50ML IV EMUL
INTRAVENOUS | Status: DC | PRN
  Administered 2018-04-25: 50 ug/kg/min via INTRAVENOUS

## 2018-04-25 MED ORDER — SODIUM CHLORIDE 0.9 % IV SOLN: INTRAVENOUS | Status: DC

## 2018-04-25 SURGICAL SUPPLY — 15 items

## 2018-04-25 NOTE — Progress Notes (Signed)
Inpatient Diabetes Program Recommendations  AACE/ADA: New Consensus Statement on Inpatient Glycemic Control (2015)  Target Ranges:  Prepandial:   less than 140 mg/dL      Peak postprandial:   less than 180 mg/dL (1-2 hours)      Critically ill patients:  140 - 180 mg/dL   Lab Results  Component Value Date   GLUCAP 234 (H) 04/25/2018   HGBA1C 7.6 01/29/2018    Review of Glycemic Control  Diabetes history: Type 2? Outpatient Diabetes medications: NPH 25 units BID, Humalog 10-15 units BID Current orders for Inpatient glycemic control: Novolog SENSITIVE correction scale TID  Inpatient Diabetes Program Recommendations:    Patient is NPO for procedure at this time.  When able to eat, recommend starting part of home dose of insulin back: NPH 12 units BID and Novolog MODERATE (0-15 units ) correction scale TID & HS. May need to titrate NPH insulin dosage if blood sugars continue to be elevated. Will continue to monitor blood sugars while in the hospital.  Harvel Ricks RN BSN CDE Diabetes Coordinator Pager: [phone]  8am-5pm

## 2018-04-25 NOTE — Progress Notes (Signed)
Seth Abbott 12:14 PM  Subjective: Patient seen and examined and case discussed with multiple family members as well as my partner Dr. Michail Sermon and his hospital computer chart reviewed  Objective: Vital signs stable afebrile exam please see preassessment evaluation  Assessment: GI bleed in patient on aspirin and Plavix  Plan: Okay to proceed with endoscopy with anesthesia assistance  [facility]  Pager [phone] After 5PM or if no answer call [phone]

## 2018-04-25 NOTE — Care Management Obs Status (Signed)
[place] NOTIFICATION   Patient Details  Name: BRANSTON HALSTED MRN: [id number] Date of Birth: 06-21-1927   Medicare Observation Status Notification Given:  Yes    Carles Collet, RN 04/25/2018, 1:54 PM

## 2018-04-25 NOTE — Anesthesia Procedure Notes (Signed)
Procedure Name: MAC Date/Time: 04/25/2018 12:30 PM Performed by: Marsa Aris, CRNA Pre-anesthesia Checklist: Patient identified, Emergency Drugs available, Suction available, Patient being monitored and Timeout performed Patient Re-evaluated:Patient Re-evaluated prior to induction Oxygen Delivery Method: Nasal cannula Preoxygenation: Pre-oxygenation with 100% oxygen

## 2018-04-25 NOTE — Consult Note (Signed)
Referring Provider: Dr. Blaine Hamper Primary Care Physician:  [name], DO Primary Gastroenterologist:  Althia Forts  Reason for Consultation:  Melena  HPI: Seth Abbott is a 84 y.o. male with multiple medical problems on Plavix and Aspirin who started having profuse black and dark red blood per rectum without any abdominal pain, nausea, or vomiting. Felt dizzy walking to the bathroom in his hospital room this morning. Had one black stool this morning in hospital. Hgb 7.4 and now 8.4 after 2 U PRBCs. BUN 69 (17 on 04/19/18). He thinks he might have had ulcers in the 1960's. Denies EGD since that time. Reports colonoscopy 15 years ago but records not known. Denies NSAIDs. Denies alcohol. Son at bedside.  Past Medical History:  Diagnosis Date  . Aortic insufficiency    Mild to moderate by echo 02/2018  . Aortic stenosis, mild    by echo 02/2018  . Arthritis    "mostly in my hands" (02/09/2015)  . CAD (coronary artery disease)    a. 12/2014 cath showed 95% prox RCA and diffuse calcified prox to distal LAD disease up to 70% at multiple spots. S/p rotational atherectomy/DES to prox RCA 02/09/15. Repeat cath 01/04/2017 showed 70-80% prox to mid LAD stenosis with mild progression since 2016, patent LCx, patent RCA stent and 50-60% mid to distal RCA with normal LVF and normal LVEDP.  Medical management was recommended unless he ha  . Dilated aortic root ([place])    95mm by echo 12/2016  . GERD (gastroesophageal reflux disease)   . HTN (hypertension)   . Hyperlipidemia   . Prostatitis   . Shingles   . TIA (transient ischemic attack) early 1990's; 09/2006   [name] 10/26/2010  . Type II diabetes mellitus ([place])     Past Surgical History:  Procedure Laterality Date  . CARDIAC CATHETERIZATION N/A 12/11/2014   Procedure: Left Heart Cath and Coronary Angiography;  Surgeon: Belva Crome, MD;  Location: [place] [facility];  Service: Cardiovascular;  Laterality: N/A;  . CARDIAC CATHETERIZATION N/A 02/09/2015    Procedure: Coronary Stent Intervention-Rotoblator, temp pacer;  Surgeon: Belva Crome, MD;  Location: [place];  Service: Cardiovascular;  Laterality: N/A;  . CATARACT EXTRACTION W/ INTRAOCULAR LENS  IMPLANT, BILATERAL Bilateral   . EXCISIONAL HEMORRHOIDECTOMY  1957  . EYE SURGERY    . LEFT HEART CATH AND CORONARY ANGIOGRAPHY N/A 01/04/2017   Procedure: Left Heart Cath and Coronary Angiography;  Surgeon: Belva Crome, MD;  Location: [facility];  Service: Cardiovascular;  Laterality: N/A;  . MEMBRANE PEEL Left 01/11/2015   eye    Prior to Admission medications   Medication Sig Start Date End Date Taking? Authorizing Provider  aspirin 81 MG tablet Take 81 mg by mouth every morning.    Yes [provider]  atorvastatin (LIPITOR) 40 MG tablet Take 1 tablet (40 mg total) by mouth every morning. 03/05/18  Yes Turner, Eber Hong, MD  clopidogrel (PLAVIX) 75 MG tablet TAKE 1 TABLET BY MOUTH EVERY DAY 12/14/17  Yes Turner, Traci R, MD  FLUoxetine (PROZAC) 20 MG tablet TAKE 1 TABLET BY MOUTH EVERY DAY 04/11/18  Yes Opalski, Deborah, DO  insulin lispro (HUMALOG) 100 UNIT/ML cartridge Inject 10-15 Units into the skin 2 (two) times daily. Pt ranges his insulin with his blood sugar readings   Yes [provider]  insulin NPH Human (HUMULIN N,NOVOLIN N) 100 UNIT/ML injection Inject 25 Units into the skin 2 (two) times daily before a meal.  Yes [provider]  isosorbide mononitrate (IMDUR) 60 MG 24 hr tablet Take 1 tablet (60 mg total) by mouth daily. 03/05/18  Yes Turner, Eber Hong, MD  metoprolol succinate (TOPROL-XL) 25 MG 24 hr tablet TAKE 1 TABLET BY MOUTH DAILY . TAKE WITH OR IMMEDIATELY FOLLOWING A MEAL 03/29/18  Yes Turner, Eber Hong, MD  Multiple Vitamins-Minerals (MULTIVITAMIN WITH MINERALS) tablet Take 1 tablet by mouth daily.   Yes [provider]  nitroGLYCERIN (NITROSTAT) 0.4 MG SL tablet Place 1 tablet (0.4 mg total) under the tongue every 5 (five)  minutes as needed for chest pain (up to 3 doses). 02/10/15  Yes Dunn, Dayna N, PA-C  pantoprazole (PROTONIX) 20 MG tablet TAKE 1 TABLET BY MOUTH EVERY DAY 12/18/17  Yes Turner, Eber Hong, MD  ACCU-CHEK AVIVA PLUS test strip USE TO CHECK BLOOD SUGAR 3 TIMES DAILY 02/21/16   [provider]    Scheduled Meds: . atorvastatin  40 mg Oral q morning - 10a  . FLUoxetine  20 mg Oral Daily  . Influenza vac split quadrivalent PF  0.5 mL Intramuscular Tomorrow-1000  . insulin aspart  0-9 Units Subcutaneous TID WC  . insulin NPH Human  17 Units Subcutaneous BID AC  . isosorbide mononitrate  60 mg Oral Daily  . [START ON 04/26/2018] metoprolol succinate  25 mg Oral Daily  . multivitamin with minerals  1 tablet Oral Daily   Continuous Infusions: . sodium chloride 75 mL/hr at 04/24/18 2303  . pantoprozole (PROTONIX) infusion 8 mg/hr (04/25/18 0901)   PRN Meds:.acetaminophen, hydrALAZINE, nitroGLYCERIN, ondansetron (ZOFRAN) IV, zolpidem  Allergies as of 04/24/2018  . (No Known Allergies)    Family History  Problem Relation Age of Onset  . Diabetes Mother   . Heart attack Father   . Diabetes Father   . CAD Father   . Diabetes Sister   . COPD Brother   . Diabetes Sister     Social History   Socioeconomic History  . Marital status: Widowed    Spouse name: Not on file  . Number of children: Not on file  . Years of education: Not on file  . Highest education level: Not on file  Occupational History  . Occupation: retired  Scientific laboratory technician  . Financial resource strain: Not on file  . Food insecurity:    Worry: Not on file    Inability: Not on file  . Transportation needs:    Medical: Not on file    Non-medical: Not on file  Tobacco Use  . Smoking status: Former Smoker    Types: Cigarettes  . Smokeless tobacco: Never Used  . Tobacco comment: "haven't smoked since I was 15" tried it once  Substance and Sexual Activity  . Alcohol use: Yes    Alcohol/week: 0.0 standard drinks     Comment: 02/09/2015 "might have a sip of wne a dozen times/yr"  . Drug use: No  . Sexual activity: Not Currently  Lifestyle  . Physical activity:    Days per week: Not on file    Minutes per session: Not on file  . Stress: Not on file  Relationships  . Social connections:    Talks on phone: Not on file    Gets together: Not on file    Attends religious service: Not on file    Active member of club or organization: Not on file    Attends meetings of clubs or organizations: Not on file    Relationship status: Not on file  .  Intimate partner violence:    Fear of current or ex partner: Not on file    Emotionally abused: Not on file    Physically abused: Not on file    Forced sexual activity: Not on file  Other Topics Concern  . Not on file  Social History Narrative  . Not on file    Review of Systems: All negative except as stated above in HPI.  Physical Exam: Vital signs: Vitals:   04/25/18 0330 04/25/18 0832  BP: 131/70 124/64  Pulse: 80 85  Resp: (!) 25 18  Temp: 97.8 F (36.6 C) (!) 97.5 F (36.4 C)  SpO2: 100% 100%   Last BM Date: 04/24/18 General:   Elderly, lethargic, Well-developed, well-nourished, pleasant and cooperative in NAD Head: normocephalic, atraumatic Eyes: anicteric sclera ENT: oropharynx clear Neck: supple, nontender Lungs:  Clear throughout to auscultation.   No wheezes, crackles, or rhonchi. No acute distress. Heart:  Regular rate and rhythm; no murmurs, clicks, rubs,  or gallops. Abdomen: soft, nontender, nondistended, +BS  Rectal:  Deferred Ext: no edema  GI:  Lab Results: Recent Labs    04/24/18 1755 04/24/18 2115 04/25/18 0658  WBC 9.5 6.2 6.0  HGB 9.1* 7.4* 8.4*  HCT 28.3* 22.7* 25.7*  PLT 214 141* 150   BMET Recent Labs    04/24/18 1755 04/24/18 2226  NA 133*  --   K 6.5* 4.4  CL 103  --   CO2 20*  --   GLUCOSE 450*  --   BUN 69*  --   CREATININE 1.40*  --   CALCIUM 9.3  --    LFT Recent Labs    04/24/18 1755   PROT 5.7*  ALBUMIN 3.3*  AST 20  ALT 18  ALKPHOS 61  BILITOT 0.5   PT/INR Recent Labs    04/24/18 1758  LABPROT 14.8  INR 1.17     Studies/Results: No results found.  Impression/Plan: Painless rectal bleeding with mainly melena concerning for a peptic ulcer. Continue Protonix drip. EGD today and risks/benefits discussed with patient and his son and he agrees to proceed. NPO. Supportive care.    LOS: 0 days   [name]  04/25/2018, 9:57 AM  Questions please call [phone]

## 2018-04-25 NOTE — Progress Notes (Signed)
PROGRESS NOTE    Seth Abbott  ZOX:[id number] DOB: 1928-02-01 DOA: 04/24/2018 PCP: [name], DO   Brief Narrative:  84 year old male with history of CAD last cardiac cath in January 04 2017 with 72% proximal to mid LAD stenosis, patent LCx, patent RCA stent, 50 to 60% mid to distal RCA with normal LVEF, on medical management, history of TIA, on aspirin and Plavix, hypertension/hyperlipidemia/diabetes with CKD GERD and depression, whose last colonoscopy was 15 years ago, no recent GI bleeding admitted episode of dizziness at home with loose profuse black and dark stool per rectum without nausea vomiting hematemesis. In the ER, FOBT positive elevated lactate of 4.4, hemoglobin of 9.1 g, prior baseline 12.9 on 02/02/2018, hyperkalemia potassium 6.5, worsening renal failure, hyperglycemia, otherwise hemodynamically stable, GI was consulted and notified and patient was admitted on Protonix drip and received aggressive IV fluids with improvement in potassium, downtrending lactic acid.  Patient was transfused 1 unit of PRBC  Assessment & Plan:   GIB likely upper:Continue on Protonix drip, normal saline at 75 mL/h, n.p.o.  Maintain 2 wide  bore IV line. GI has been consulted and has seen the patient and plan for endoscopy today.  Monitor serial H&H and transfuse if further bleeding or hemoglobin less than 7 g.  Continue to hold aspirin Plavix for now- resume once okay w GI.  Acute renal failure superimposed on stage 3 chronic kidney disease Baseline bun/creatinine  17/1.1 (04/19/18): Prerenal due to GI bleeding, creat on admission 1.4, resuscitated with IV fluids.  Repeat BMP.  Hyperkalemia: Resolved after temporizing measures, insulin and nebs.  Lactic acidosis: Suspecting due to GI bleeding/volume depletion.  No evidence of infection.  Level downtrending.  Repeat today.  Symptomatic anemia, acute blood loss anemia from GI bleeding.  Status post 1 unit PRBC, hemoglobin improved 7.4->8.4  gm  CAD (coronary artery disease), native coronary artery,last cardiac cath in January 04 2017 with 72% proximal to mid LAD stenosis, patent LCx, patent RCA stent, 50 to 60% mid to distal RCA with normal LVEF, on medical management: No chest pain.  Troponin negative.  Aspirin Plavix on hold.  Hopefully resume Imdur and metoprolol soon.  Hyperlipidemia LDL goal <70: Continue Lipitor  HTN (hypertension): Blood pressure is stable we will hold medication due to GI bleeding, and resume tomorrow  Type II diabetes mellitus with renal manifestations on long-term NPH insulin, last A1c 7.6, 01/29/18.  Poorly controlled on admission.Hold NPH for now and continue sliding scale as patient is n.p.o.  TIA hx: On aspirin Plavix and statin holding antiplatelets.,  GERD:FOR EGD, Continue PPI  Depression, recurrent : Mood stable, continue his home Prozac Addendum Pateint seen post egd, some loose stool. Son at bedside. Resting well, on abd pain. If creat improves further will obtain ct abd w contrast otherwise without.   DVT prophylaxis: scd.  No chemical prophylaxis due to GI bleeding Code Status: Full code Family Communication: No family at the bedside. Disposition Plan: on hold. Home once stable, 1-2 days  Consultants:  Gastroenterology Procedures: none  Subjective: Resting comfortably on room air.  Denies any bowel movement today, no nausea vomiting or abdominal pain.  Objective: Vitals:   04/24/18 2306 04/25/18 0315 04/25/18 0330 04/25/18 0832  BP: 110/68 131/70 131/70 124/64  Pulse: 93 97 80 85  Resp: (!) 21 (!) 27 (!) 25 18  Temp:   97.8 F (36.6 C) (!) 97.5 F (36.4 C)  TempSrc:   Oral Oral  SpO2: 100% 99% 100% 100%  Weight:      Height:        Intake/Output Summary (Last 24 hours) at 04/25/2018 1057 Last data filed at 04/25/2018 0833 Gross per 24 hour  Intake 3082.39 ml  Output 1100 ml  Net 1982.39 ml   Filed Weights   04/24/18 1742 04/24/18 2231  Weight: 85.6 kg 83.7 kg    Weight change:   Body mass index is 35.44 kg/m.  Examination:  General exam: Calm, comfortable not in acute distress, obese.   HEENT:Oral mucosa moist, Ear/Nose WNL grossly Respiratory system: Bilateral equal air entry, no crackles and wheezing Cardiovascular system: S1 & S2 heard, No JVD/murmurs.No pedal edema. Gastrointestinal system: Abdomen soft, nontender non-distended, BS +. Nervous System:Alert/awake/oriented at baseline.  Able to move UE and LE Extremities: No edema,distal peripheral pulses palpable. Skin: No rashes,no icterus. MSK: Normal muscle bulk,tone ,power  Data Reviewed: I have personally reviewed following labs and imaging studies  CBC: Recent Labs  Lab 04/24/18 1755 04/24/18 2115 04/25/18 0658  WBC 9.5 6.2 6.0  HGB 9.1* 7.4* 8.4*  HCT 28.3* 22.7* 25.7*  MCV 95.0 94.6 92.4  PLT 214 141* 196   Basic Metabolic Panel: Recent Labs  Lab 04/19/18 0959 04/24/18 1755 04/24/18 2226  NA 139 133*  --   K 5.0 6.5* 4.4  CL 100 103  --   CO2 24 20*  --   GLUCOSE 160* 450*  --   BUN 17 69*  --   CREATININE 1.16 1.40*  --   CALCIUM 9.2 9.3  --    GFR: Estimated Creatinine Clearance: 33.8 mL/min (A) (by C-G formula based on SCr of 1.4 mg/dL (H)). Liver Function Tests: Recent Labs  Lab 04/19/18 0959 04/24/18 1755  AST 21 20  ALT 18 18  ALKPHOS 87 61  BILITOT 0.3 0.5  PROT 6.0 5.7*  ALBUMIN 4.0 3.3*   No results for input(s): LIPASE, AMYLASE in the last 168 hours. No results for input(s): AMMONIA in the last 168 hours. Coagulation Profile: Recent Labs  Lab 04/24/18 1758  INR 1.17   Cardiac Enzymes: No results for input(s): CKTOTAL, CKMB, CKMBINDEX, TROPONINI in the last 168 hours. BNP (last 3 results) Recent Labs    04/19/18 0959  PROBNP 196   HbA1C: No results for input(s): HGBA1C in the last 72 hours. CBG: Recent Labs  Lab 04/24/18 2229 04/25/18 0619  GLUCAP 191* 178*   Lipid Profile: No results for input(s): CHOL, HDL, LDLCALC,  TRIG, CHOLHDL, LDLDIRECT in the last 72 hours. Thyroid Function Tests: No results for input(s): TSH, T4TOTAL, FREET4, T3FREE, THYROIDAB in the last 72 hours. Anemia Panel: No results for input(s): VITAMINB12, FOLATE, FERRITIN, TIBC, IRON, RETICCTPCT in the last 72 hours. Sepsis Labs: Recent Labs  Lab 04/24/18 1815 04/24/18 2018 04/24/18 2030 04/24/18 2226  LATICACIDVEN 4.42* 3.6* 3.48* 3.4*    No results found for this or any previous visit (from the past 240 hour(s)).    Radiology Studies: No results found.      Scheduled Meds: . atorvastatin  40 mg Oral q morning - 10a  . FLUoxetine  20 mg Oral Daily  . Influenza vac split quadrivalent PF  0.5 mL Intramuscular Tomorrow-1000  . insulin aspart  0-9 Units Subcutaneous TID WC  . isosorbide mononitrate  60 mg Oral Daily  . [START ON 04/26/2018] metoprolol succinate  25 mg Oral Daily  . multivitamin with minerals  1 tablet Oral Daily   Continuous Infusions: . sodium chloride 75 mL/hr at 04/24/18 2303  .  pantoprozole (PROTONIX) infusion 8 mg/hr (04/25/18 0901)     LOS: 0 days    Time spent: More than 50% of that time was spent in counseling and/or coordination of care.      Antonieta Pert, MD [facility] Pager [phone]  If 7PM-7AM, please contact night-coverage [email or web address] Password [facility] 04/25/2018, 10:57 AM

## 2018-04-25 NOTE — Transfer of Care (Signed)
Immediate Anesthesia Transfer of Care Note  Patient: Seth Abbott  Procedure(s) Performed: ESOPHAGOGASTRODUODENOSCOPY (EGD) WITH PROPOFOL (N/A ) BIOPSY  Patient Location: Endoscopy Unit  Anesthesia Type:MAC  Level of Consciousness: awake, alert  and oriented  Airway & Oxygen Therapy: Patient Spontanous Breathing and Patient connected to nasal cannula oxygen  Post-op Assessment: Report given to RN and Post -op Vital signs reviewed and stable  Post vital signs: Reviewed and stable  Last Vitals:  Vitals Value Taken Time  BP 95/51 04/25/2018 12:56 PM  Temp 36.6 C 04/25/2018 12:54 PM  Pulse 68 04/25/2018  1:00 PM  Resp 19 04/25/2018  1:00 PM  SpO2 100 % 04/25/2018  1:00 PM  Vitals shown include unvalidated device data.  Last Pain:  Vitals:   04/25/18 1254  TempSrc: Oral  PainSc: 0-No pain         Complications: No apparent anesthesia complications

## 2018-04-25 NOTE — Anesthesia Preprocedure Evaluation (Signed)
Anesthesia Evaluation  Patient identified by MRN, date of birth, ID band Patient awake    Reviewed: Allergy & Precautions, NPO status , Patient's Chart, lab work & pertinent test results  Airway Mallampati: II  TM Distance: >3 FB Neck ROM: Full    Dental no notable dental hx.    Pulmonary former smoker,    Pulmonary exam normal breath sounds clear to auscultation       Cardiovascular hypertension, Pt. on home beta blockers + CAD and + Cardiac Stents  + Valvular Problems/Murmurs AS and AI  Rhythm:Regular Rate:Normal + Systolic murmurs ECG: rate 84 Sinus rhythm RBBB and LPFB  ECHO: Normal LV EF without obvious wall motion abnormalities. Wall motion not ideal in several views, but patient declined echo contrast. Grade 1 diastolic dysfunction. Thickened aortic valve with very mild stenosis and mild-moderate regurgitation.      Neuro/Psych PSYCHIATRIC DISORDERS Depression TIA   GI/Hepatic Neg liver ROS, GERD  Controlled,  Endo/Other  diabetes, Insulin Dependent  Renal/GU negative Renal ROS     Musculoskeletal negative musculoskeletal ROS (+)   Abdominal (+) + obese,   Peds  Hematology negative hematology ROS (+) anemia , HLD   Anesthesia Other Findings Melena  Reproductive/Obstetrics                             Anesthesia Physical Anesthesia Plan  ASA: III  Anesthesia Plan: MAC   Post-op Pain Management:    Induction: Intravenous  PONV Risk Score and Plan: 1 and Propofol infusion and Treatment may vary due to age or medical condition  Airway Management Planned: Nasal Cannula  Additional Equipment:   Intra-op Plan:   Post-operative Plan:   Informed Consent: I have reviewed the patients History and Physical, chart, labs and discussed the procedure including the risks, benefits and alternatives for the proposed anesthesia with the patient or authorized representative who has  indicated his/her understanding and acceptance.   Dental advisory given  Plan Discussed with: CRNA  Anesthesia Plan Comments:         Anesthesia Quick Evaluation

## 2018-04-25 NOTE — Op Note (Signed)
[facility] Patient Name: Seth Abbott Procedure Date : 04/25/2018 MRN: [id number] Attending MD: Clarene Essex , MD Date of Birth: Jan 18, 1928 CSN: [id number] Age: 84 Admit Type: Inpatient Procedure:                Upper GI endoscopy Indications:              Acute post hemorrhagic anemia, Melena Providers:                Clarene Essex, MD, Carlyn Reichert, RN, William Dalton,                            Technician Referring MD:              Medicines:                Propofol total dose 270 mg IV Complications:            No immediate complications. Estimated Blood Loss:     Estimated blood loss: none. Procedure:                Pre-Anesthesia Assessment:                           - Prior to the procedure, a History and Physical                            was performed, and patient medications and                            allergies were reviewed. The patient's tolerance of                            previous anesthesia was also reviewed. The risks                            and benefits of the procedure and the sedation                            options and risks were discussed with the patient.                            All questions were answered, and informed consent                            was obtained. Prior Anticoagulants: The patient has                            taken Plavix (clopidogrel), last dose was 1 day                            prior to procedure. ASA Grade Assessment: III - A                            patient with severe systemic disease. After  reviewing the risks and benefits, the patient was                            deemed in satisfactory condition to undergo the                            procedure.                           After obtaining informed consent, the endoscope was                            passed under direct vision. Throughout the                            procedure, the patient's blood pressure, pulse, and                             oxygen saturations were monitored continuously. The                            GIF-H190 ([id number]) Olympus Adult EGD was introduced                            through the mouth, and advanced to the third part                            of duodenum. The upper GI endoscopy was                            accomplished without difficulty. The patient                            tolerated the procedure fairly well. Scope In: Scope Out: Findings:      The larynx was normal.      A small hiatal hernia was present.      Localized mild inflammation characterized by erythema was found in the       gastric antrum. Biopsies were taken with a cold forceps for histology.      Two non-bleeding cratered duodenal ulcers with no stigmata of bleeding       were found in the duodenal bulb.      The second portion of the duodenum and third portion of the duodenum       were normal.      The exam was otherwise without abnormality. Impression:               - Normal larynx.                           - Small hiatal hernia.                           - Caustic gastritis. Biopsied.                           - Multiple non-bleeding duodenal  ulcers with no                            stigmata of bleeding.                           - Normal second portion of the duodenum and third                            portion of the duodenum.                           - The examination was otherwise normal. Recommendation:           - Patient has a contact number available for                            emergencies. The signs and symptoms of potential                            delayed complications were discussed with the                            patient. Return to normal activities tomorrow.                            Written discharge instructions were provided to the                            patient.                           - Soft diet today. consider CT to rule out any                             other significant abnormality and okay to hold                            colonoscopy for now                           - Continue present medications. check with                            cardiology to see if he can be managed with just                            Plavix only and holding aspirin and continue once a                            day Protonix at home but increased to 40 mg                           - Await pathology results. call in 1 week for  biopsy results                           - Return to GI clinic in 4 weeks.                           - Telephone GI clinic if symptomatic PRN. Procedure Code(s):        --- Professional ---                           [phone], Esophagogastroduodenoscopy, flexible,                            transoral; with biopsy, single or multiple Diagnosis Code(s):        --- Professional ---                           K44.9, Diaphragmatic hernia without obstruction or                            gangrene                           T54.94XA, Toxic effect of unspecified corrosive                            substance, undetermined, initial encounter                           T28.7XXA, Corrosion of other parts of alimentary                            tract, initial encounter                           K26.9, Duodenal ulcer, unspecified as acute or                            chronic, without hemorrhage or perforation                           D62, Acute posthemorrhagic anemia                           K92.1, Melena (includes Hematochezia) CPT copyright 2018 [employer]. All rights reserved. The codes documented in this report are preliminary and upon coder review may  be revised to meet current compliance requirements. Clarene Essex, MD 04/25/2018 12:53:15 PM This report has been signed electronically. Number of Addenda: 0

## 2018-04-25 NOTE — Anesthesia Postprocedure Evaluation (Signed)
Anesthesia Post Note  Patient: Seth Abbott  Procedure(s) Performed: ESOPHAGOGASTRODUODENOSCOPY (EGD) WITH PROPOFOL (N/A ) BIOPSY     Patient location during evaluation: PACU Anesthesia Type: MAC Level of consciousness: awake Pain management: pain level controlled Vital Signs Assessment: post-procedure vital signs reviewed and stable Respiratory status: spontaneous breathing, nonlabored ventilation, respiratory function stable and patient connected to nasal cannula oxygen Cardiovascular status: stable and blood pressure returned to baseline Postop Assessment: no apparent nausea or vomiting Anesthetic complications: no    Last Vitals:  Vitals:   04/25/18 1305 04/25/18 1310  BP: (!) 101/52 (!) 103/55  Pulse: 74 72  Resp: 16 18  Temp:    SpO2: 100% 97%    Last Pain:  Vitals:   04/25/18 1310  TempSrc:   PainSc: 0-No pain                 Ryan P Ellender

## 2018-04-26 LAB — BASIC METABOLIC PANEL
Anion gap: 6 (ref 5–15)
BUN: 30 mg/dL — ABNORMAL HIGH (ref 8–23)
CO2: 22 mmol/L (ref 22–32)
Calcium: 7.5 mg/dL — ABNORMAL LOW (ref 8.9–10.3)
Chloride: 112 mmol/L — ABNORMAL HIGH (ref 98–111)
Creatinine, Ser: 1.24 mg/dL (ref 0.61–1.24)
GFR calc Af Amer: 58 mL/min — ABNORMAL LOW (ref >60)
GFR, EST NON AFRICAN AMERICAN: 50 mL/min — AB (ref >60)
Glucose, Bld: 295 mg/dL — ABNORMAL HIGH (ref 70–99)
POTASSIUM: 3.6 mmol/L (ref 3.5–5.1)
Sodium: 140 mmol/L (ref 135–145)

## 2018-04-26 LAB — GLUCOSE, CAPILLARY
GLUCOSE-CAPILLARY: 264 mg/dL — AB (ref 70–99)
GLUCOSE-CAPILLARY: 224 mg/dL — AB (ref 70–99)
GLUCOSE-CAPILLARY: 155 mg/dL — AB (ref 70–99)
Glucose-Capillary: 259 mg/dL — ABNORMAL HIGH (ref 70–99)

## 2018-04-26 LAB — CBC
HCT: 22.8 % — ABNORMAL LOW (ref 39.0–52.0)
Hemoglobin: 7.4 g/dL — ABNORMAL LOW (ref 13.0–17.0)
MCH: 30.1 pg (ref 26.0–34.0)
MCHC: 32.5 g/dL (ref 30.0–36.0)
MCV: 92.7 fL (ref 80.0–100.0)
NRBC: 0 % (ref 0.0–0.2)
PLATELETS: 127 10*3/uL — AB (ref 150–400)
RBC: 2.46 MIL/uL — AB (ref 4.22–5.81)
RDW: 14.1 % (ref 11.5–15.5)
WBC: 4.2 10*3/uL (ref 4.0–10.5)

## 2018-04-26 LAB — PREPARE RBC (CROSSMATCH)

## 2018-04-26 MED ORDER — CLOPIDOGREL BISULFATE 75 MG PO TABS
75.0000 mg | ORAL_TABLET | Freq: Every day | ORAL | Status: DC
  Administered 2018-04-26 – 2018-04-28 (×3): 75 mg via ORAL
  Filled 2018-04-26 (×3): qty 1

## 2018-04-26 MED ORDER — INSULIN NPH (HUMAN) (ISOPHANE) 100 UNIT/ML ~~LOC~~ SUSP
13.0000 [IU] | Freq: Two times a day (BID) | SUBCUTANEOUS | Status: DC
  Administered 2018-04-26 – 2018-04-28 (×4): 13 [IU] via SUBCUTANEOUS
  Filled 2018-04-26: qty 10

## 2018-04-26 MED ORDER — METOPROLOL SUCCINATE ER 25 MG PO TB24
25.0000 mg | ORAL_TABLET | Freq: Every day | ORAL | Status: DC
  Administered 2018-04-26 – 2018-04-28 (×3): 25 mg via ORAL
  Filled 2018-04-26 (×3): qty 1

## 2018-04-26 MED ORDER — SODIUM CHLORIDE 0.9% IV SOLUTION: Freq: Once | INTRAVENOUS | Status: DC

## 2018-04-26 NOTE — Progress Notes (Signed)
Patient  Has been refusing his Protonix P.O.

## 2018-04-26 NOTE — Progress Notes (Signed)
PROGRESS NOTE    Seth Abbott  SHF:[id number] DOB: 1927-12-09 DOA: 04/24/2018 PCP: [name], DO   Brief Narrative:  84 year old male with history of CAD last cardiac cath in January 04 2017 with 72% proximal to mid LAD stenosis, patent LCx, patent RCA stent, 50 to 60% mid to distal RCA with normal LVEF, on medical management, history of TIA, on aspirin and Plavix, hypertension/hyperlipidemia/diabetes with CKD GERD and depression, whose last colonoscopy was 15 years ago, no recent GI bleeding admitted episode of dizziness at home with loose profuse black and dark stool per rectum without nausea vomiting hematemesis. In the ER, FOBT positive elevated lactate of 4.4, hemoglobin of 9.1 g, prior baseline 12.9 on 02/02/2018, hyperkalemia potassium 6.5, worsening renal failure, hyperglycemia, otherwise hemodynamically stable, GI was consulted and notified and patient was admitted on Protonix drip and received aggressive IV fluids with improvement in potassium, downtrending lactic acid.  Patient was transfused 1 unit of PRBC. He is s/p EGD that showed multiple non bleeding duodenal ulcers, no stigmata of bleeding, caustic gastritis.  Assessment & Plan:   GIB likely upper: s/p EGD showed multiple non bleeding duodenal ulcers, no stigmata of bleeding, caustic gastritis. Cont ppi- he resufed stating it gives diarrhea- he tolerated iv ppi. We discussed for PPI needs and  Agreeable to take. H/H dropped to 7.4 gm-still having black stool, discussed about giving 1 more unit of blood, patient and his sin are agreeable, discussed risks benefits.  Discussed with gastroenterologist, advsied 1 unit prbc and hydrate 1 more day w nss and if creatinine less than today's we will get CT abdomen pelvis with IV contrast tomorrow if not then only w PO contrast.Aspirin and Plavix were held, we will resume plavix, and discontinue asa as per gi recommendations, I discussed with his cardiologist Dr Radford Pax and in she is  agreeable.  EGD report " Small hiatal hernia. - Caustic gastritis. Biopsied. - Multiple non-bleeding duodenal ulcers with no stigmata of bleeding. - Normal second portion of the duodenum and third portion of the duodenum. - The examination was otherwise normal. -consider CT to rule out any other significant abnormality and okay to hold colonoscopy for now - Continue present medications. check with cardiology to see if he can be managed with just Plavix only and holding aspirin and continue once a day Protonix at home but increased to 40 mg - Await pathology results. call in 1 week for biopsy results - Return to GI clinic in 4 weeks. - Telephone GI clinic if symptomatic PRN  Acute renal failure superimposed on stage 3 chronic kidney disease Baseline bun/creatinine  17/1.1 (04/19/18): Prerenal due to GI bleeding, creat on admission 1.4, resuscitated with IV fluids.  Repeat BMP w improved creat at 1.2.  Hyperkalemia: Resolved after temporizing measures, insulin and nebs in ER.  Lactic acidosis: Suspecting due to GI bleeding/volume depletion. Resolved.  Symptomatic anemia, acute blood loss anemia from GI bleeding.  Status post 1 unit PRBC, hemoglobin improved 7.4->8.4->7.4 gm.  Transfusing 1 unit of PRBC.  Repeat CBC he is still having black stool.  CAD (coronary artery disease), native coronary artery, RCA stent in 2016- had cardiac cath in January 04 2017 with 72% proximal to mid LAD stenosis, patent LCx, patent RCA stent, 50 to 60% mid to distal RCA with normal LVEF, on medical management- asa, plavix, toprol, imdur, statins. Resume plavix. Stopped asa due to gi bleed-Dr Turner okay with it.  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 Poorly controlled. Resume NPH at 13 u bid, continue sliding scale as pt is now  eating  TIA hx: On aspirin Plavix and statin holding antiplatelets.,  GERD:FOR EGD, Continue PPI  Depression, recurrent : Mood stable, continue his home Prozac  DVT prophylaxis: scd.  No chemical prophylaxis due to GI bleeding Code Status: Full code Family Communication:No family at the bedside. Updated son over the phone today. Disposition Plan:Home in 1-2 days  Consultants:  Gastroenterology Procedures: EGD  Subjective: Hard BM w black in color, no more diarrhea.  no chest pain, nausea or vomiting Refusing protonix.  Objective: Vitals:   04/25/18 2042 04/25/18 2342 04/26/18 0149 04/26/18 0404  BP: (!) 119/46 (!) 99/52 122/62 (!) 143/72  Pulse: 83 93 89 (!) 101  Resp: (!) 24 (!) 24 (!) 22 (!) 25  Temp: 98 F (36.7 C) 98 F (36.7 C)  98.1 F (36.7 C)  TempSrc: Oral Oral  Oral  SpO2: 99% 99% 99% 100%  Weight:    83.3 kg  Height:        Intake/Output Summary (Last 24 hours) at 04/26/2018 0752 Last data filed at 04/26/2018 0700 Gross per 24 hour  Intake 1480 ml  Output 1650 ml  Net -170 ml   Filed Weights   04/24/18 1742 04/24/18 2231 04/26/18 0404  Weight: 85.6 kg 83.7 kg 83.3 kg   Weight change: -2.3 kg  Body mass index is 35.27 kg/m.  Examination:  General exam: Calm, comfortable NAD. OBESE. HEENT:Oral mucosa moist, Ear/Nose WNL grossly Respiratory system: Bilateral equal air entry, no crackles and wheezing Cardiovascular system: S1 & S2 heard, No JVD/murmurs.No pedal edema. Gastrointestinal system: Abdomen soft, nontender non-distended, BS +. Nervous System:Alert/awake/oriented at baseline.  Able to move UE and LE Extremities: No edema,distal peripheral pulses palpable. Skin: No rashes,no icterus. MSK: Normal muscle bulk,tone ,power  Data Reviewed: I have personally reviewed following labs and imaging studies  CBC: Recent Labs  Lab 04/24/18 1755 04/24/18 2115 04/25/18 0658 04/25/18 1531 04/26/18 0352  WBC 9.5 6.2 6.0 6.3 4.2  HGB 9.1* 7.4*  8.4* 8.3* 7.4*  HCT 28.3* 22.7* 25.7* 25.2* 22.8*  MCV 95.0 94.6 92.4 92.6 92.7  PLT 214 141* 150 146* 983*   Basic Metabolic Panel: Recent Labs  Lab 04/19/18 0959 04/24/18 1755 04/24/18 2226 04/25/18 1531 04/26/18 0352  NA 139 133*  --  138 140  K 5.0 6.5* 4.4 3.7 3.6  CL 100 103  --  110 112*  CO2 24 20*  --  22 22  GLUCOSE 160* 450*  --  231* 295*  BUN 17 69*  --  40* 30*  CREATININE 1.16 1.40*  --  1.30* 1.24  CALCIUM 9.2 9.3  --  8.0* 7.5*   GFR: Estimated Creatinine Clearance: 38 mL/min (by C-G formula based on SCr of 1.24 mg/dL). Liver Function Tests: Recent Labs  Lab 04/19/18 0959 04/24/18 1755  AST 21 20  ALT 18 18  ALKPHOS 87 61  BILITOT 0.3 0.5  PROT 6.0 5.7*  ALBUMIN 4.0 3.3*   No results for input(s): LIPASE, AMYLASE in the last 168 hours. No results for input(s): AMMONIA in the last 168 hours. Coagulation Profile: Recent Labs  Lab 04/24/18 1758  INR 1.17   Cardiac Enzymes: No results for input(s): CKTOTAL, CKMB, CKMBINDEX, TROPONINI in the last 168 hours. BNP (last 3 results) Recent  Labs    04/19/18 0959  PROBNP 196   HbA1C: No results for input(s): HGBA1C in the last 72 hours. CBG: Recent Labs  Lab 04/25/18 0619 04/25/18 1114 04/25/18 1626 04/25/18 2158 04/26/18 0624  GLUCAP 178* 234* 252* 303* 264*   Lipid Profile: No results for input(s): CHOL, HDL, LDLCALC, TRIG, CHOLHDL, LDLDIRECT in the last 72 hours. Thyroid Function Tests: No results for input(s): TSH, T4TOTAL, FREET4, T3FREE, THYROIDAB in the last 72 hours. Anemia Panel: No results for input(s): VITAMINB12, FOLATE, FERRITIN, TIBC, IRON, RETICCTPCT in the last 72 hours. Sepsis Labs: Recent Labs  Lab 04/24/18 2018 04/24/18 2030 04/24/18 2226 04/25/18 1531  LATICACIDVEN 3.6* 3.48* 3.4* 1.7    No results found for this or any previous visit (from the past 240 hour(s)).    Radiology Studies: No results found.      Scheduled Meds: . atorvastatin  40 mg Oral q  morning - 10a  . FLUoxetine  20 mg Oral Daily  . insulin aspart  0-9 Units Subcutaneous TID WC  . insulin NPH Human  13 Units Subcutaneous BID AC & HS  . isosorbide mononitrate  60 mg Oral Daily  . metoprolol succinate  25 mg Oral Daily  . multivitamin with minerals  1 tablet Oral Daily  . pantoprazole  40 mg Oral BID AC   Continuous Infusions: . sodium chloride 75 mL/hr at 04/26/18 0350     LOS: 1 day    Time spent: More than 50% of that time was spent in counseling and/or coordination of care.      Antonieta Pert, MD [facility] Pager [phone]  If 7PM-7AM, please contact night-coverage [email or web address] Password [email or web address] 04/26/2018, 7:52 AM

## 2018-04-26 NOTE — Progress Notes (Signed)
[name] 12:50 PM  Subjective: Patient doing well from his endoscopy and we again discussed the findings and discussed how he needs to take his ulcer medicine and he has no new complaints and he is tolerating his diet  Objective: Vital signs stable afebrile no acute distress abdomen is soft nontender slightly decreased hemoglobin slight decreased BUN and creatinine  Assessment: Duodenal ulcers small in patient with guaiac positivity on blood thinners  Plan: Await CT to rule out anything significant and if okay I'm happy to see back in the office in one month to recheck symptoms CBC etc. And make sure no further workup and plans are needed  [facility]  Pager [phone] After 5PM or if no answer call [phone]

## 2018-04-27 ENCOUNTER — Inpatient Hospital Stay (HOSPITAL_COMMUNITY): Payer: Medicare Other

## 2018-04-27 ENCOUNTER — Encounter (HOSPITAL_COMMUNITY): Payer: Self-pay | Admitting: Radiology

## 2018-04-27 DIAGNOSIS — D649 Anemia, unspecified: Secondary | ICD-10-CM

## 2018-04-27 DIAGNOSIS — I2510 Atherosclerotic heart disease of native coronary artery without angina pectoris: Secondary | ICD-10-CM

## 2018-04-27 DIAGNOSIS — E1129 Type 2 diabetes mellitus with other diabetic kidney complication: Secondary | ICD-10-CM

## 2018-04-27 LAB — GLUCOSE, CAPILLARY
GLUCOSE-CAPILLARY: 95 mg/dL (ref 70–99)
GLUCOSE-CAPILLARY: 180 mg/dL — AB (ref 70–99)
Glucose-Capillary: 197 mg/dL — ABNORMAL HIGH (ref 70–99)
Glucose-Capillary: 200 mg/dL — ABNORMAL HIGH (ref 70–99)

## 2018-04-27 LAB — BPAM RBC
Blood Product Expiration Date: 201912112359
Blood Product Expiration Date: 201912112359
ISSUE DATE / TIME: 201911132246
ISSUE DATE / TIME: 201911151533
Unit Type and Rh: 5100
Unit Type and Rh: 5100

## 2018-04-27 LAB — TYPE AND SCREEN
ABO/RH(D): O POS
ANTIBODY SCREEN: NEGATIVE
Unit division: 0
Unit division: 0

## 2018-04-27 LAB — CBC
HCT: 25.4 % — ABNORMAL LOW (ref 39.0–52.0)
Hemoglobin: 8 g/dL — ABNORMAL LOW (ref 13.0–17.0)
MCH: 29.5 pg (ref 26.0–34.0)
MCHC: 31.5 g/dL (ref 30.0–36.0)
MCV: 93.7 fL (ref 80.0–100.0)
Platelets: 128 10*3/uL — ABNORMAL LOW (ref 150–400)
RBC: 2.71 MIL/uL — ABNORMAL LOW (ref 4.22–5.81)
RDW: 14.1 % (ref 11.5–15.5)
WBC: 3.9 10*3/uL — ABNORMAL LOW (ref 4.0–10.5)
nRBC: 0 % (ref 0.0–0.2)

## 2018-04-27 LAB — BASIC METABOLIC PANEL
ANION GAP: 5 (ref 5–15)
BUN: 21 mg/dL (ref 8–23)
CHLORIDE: 114 mmol/L — AB (ref 98–111)
CO2: 22 mmol/L (ref 22–32)
CREATININE: 1.04 mg/dL (ref 0.61–1.24)
Calcium: 7.7 mg/dL — ABNORMAL LOW (ref 8.9–10.3)
GFR calc non Af Amer: >60 mL/min (ref >60)
Glucose, Bld: 95 mg/dL (ref 70–99)
Potassium: 3.2 mmol/L — ABNORMAL LOW (ref 3.5–5.1)
SODIUM: 141 mmol/L (ref 135–145)

## 2018-04-27 MED ORDER — IOHEXOL 300 MG/ML  SOLN
100.0000 mL | Freq: Once | INTRAMUSCULAR | Status: AC | PRN
  Administered 2018-04-27: 100 mL via INTRAVENOUS

## 2018-04-27 NOTE — Progress Notes (Signed)
PROGRESS NOTE    Seth Abbott  QIW:[id number] DOB: 1927-11-18 DOA: 04/24/2018 PCP: [name], DO      Brief Narrative:  Seth Abbott is a 84 y.o. M with hx CAD s/p PCI, last >4 yrs, hx TIA, HTN, DM, CKD who presented with hematochezia and melena, lactate 4.4 and Hgb 9 down from baseline 13 g/dL in August.     Assessment & Plan:  Upper GI bleed Melena Endoscopy on 11/14 showed duodenal ulcers, no high risk features.   CT abdomen today showed hernia, no colonic masses or abnormalities. -Continue PPI -Consult Gastroenterology, appreciate cares   Acute blood loss anemia Transfused on 11/13 and 11/15.  Hgb today <1 g/dL increase from yesterday.  Brown stool today. Hgb today 8 g/dL.   -Transfusion threshold 8 g/dL given CAD -Repeat Hgb tomorrow and transfuse before d/c if needed  -Start oral iron at discharge  Dehydration AKI ruled out Cr back to baseline 1.1 with fluids.  Hypertension Coronary disease History of TIA BP okay. -Continue Plavix -Continue Imdur and metoprolol  Diaebtes Glucoses normal -Continue NPH -Continue SSI  Other medications -Continue fluoxetine    MDM and disposition: The below labs and imaging reports were reviewed and summarized above.  Medication management as above.  The patient was admitted with melena, found to have bleeding duodenal ulcer.  Bleeding now stopped, but hemoglobin continues to trend down. CT today normal, cleared for discharge per GI.  Will check Hgb tomorrow and transfuse before discharge if low.         DVT prophylaxis: SCDs Code Status: FULL Family Communication: Son     Consultants:   GI  Procedures:   EGD Findings:      The larynx was normal.      A small hiatal hernia was present.      Localized mild inflammation characterized by erythema was found in the       gastric antrum. Biopsies were taken with a cold forceps for histology.      Two non-bleeding cratered duodenal ulcers with no  stigmata of bleeding       were found in the duodenal bulb.      The second portion of the duodenum and third portion of the duodenum       were normal.      The exam was otherwise without abnormality. Impression:               - Normal larynx.                           - Small hiatal hernia.                           - Caustic gastritis. Biopsied.                           - Multiple non-bleeding duodenal ulcers with no                            stigmata of bleeding.                           - Normal second portion of the duodenum and third  portion of the duodenum.                           - The examination was otherwise normal. Recommendation:           - Patient has a contact number available for                            emergencies. The signs and symptoms of potential                            delayed complications were discussed with the                            patient. Return to normal activities tomorrow.                            Written discharge instructions were provided to the                            patient.                           - Soft diet today. consider CT to rule out any                            other significant abnormality and okay to hold                            colonoscopy for now                           - Continue present medications. check with                            cardiology to see if he can be managed with just                            Plavix only and holding aspirin and continue once a                            day Protonix at home but increased to 40 mg                           - Await pathology results. call in 1 week for                            biopsy results                           - Return to GI clinic in 4 weeks.                           - Telephone GI clinic if symptomatic PRN.   Antimicrobials:   None    Subjective: No  more melena.  Weak and tired, but no dizziness with  standing, severe fatigue.  No confusion, fever.  No hematochezia.  No voiting.  Objective: Vitals:   04/27/18 0226 04/27/18 0400 04/27/18 0801 04/27/18 1239  BP:  119/64 (!) 148/74 (!) 112/56  Pulse:  77 80 72  Resp: (!) 24 (!) 23 20 (!) 25  Temp:  97.7 F (36.5 C) 98.1 F (36.7 C) 98 F (36.7 C)  TempSrc:  Oral Oral Oral  SpO2:  92% 96% 96%  Weight: 84.4 kg     Height:        Intake/Output Summary (Last 24 hours) at 04/27/2018 1623 Last data filed at 04/27/2018 1005 Gross per 24 hour  Intake 1417.84 ml  Output 980 ml  Net 437.84 ml   Filed Weights   04/24/18 2231 04/26/18 0404 04/27/18 0226  Weight: 83.7 kg 83.3 kg 84.4 kg    Examination: General appearance:  adult male, alert and in no acute distress.   HEENT: Anicteric, conjunctiva pink, lids and lashes normal. No nasal deformity, discharge, epistaxis.  Lips moist.   Skin: Warm and dry.  no jaundice.  No suspicious rashes or lesions. Cardiac: RRR, nl S1-S2, no murmurs appreciated.  Capillary refill is brisk.  JVP normal.  No LE edema.  Radia  pulses 2+ and symmetric. Respiratory: Normal respiratory rate and rhythm.  CTAB without rales or wheezes. Abdomen: Abdomen soft.  no TTP. No ascites, distension, hepatosplenomegaly.  No hernias apprecitated MSK: No deformities or effusions. Neuro: Awake and alert.  EOMI, moves all extremities. Speech fluent.    Psych: Sensorium intact and responding to questions, attention normal. Affect normal.  Judgment and insight appear normal.    Data Reviewed: I have personally reviewed following labs and imaging studies:  CBC: Recent Labs  Lab 04/24/18 2115 04/25/18 0658 04/25/18 1531 04/26/18 0352 04/27/18 0300  WBC 6.2 6.0 6.3 4.2 3.9*  HGB 7.4* 8.4* 8.3* 7.4* 8.0*  HCT 22.7* 25.7* 25.2* 22.8* 25.4*  MCV 94.6 92.4 92.6 92.7 93.7  PLT 141* 150 146* 127* 834*   Basic Metabolic Panel: Recent Labs  Lab 04/24/18 1755 04/24/18 2226 04/25/18 1531 04/26/18 0352 04/27/18 0300    NA 133*  --  138 140 141  K 6.5* 4.4 3.7 3.6 3.2*  CL 103  --  110 112* 114*  CO2 20*  --  22 22 22   GLUCOSE 450*  --  231* 295* 95  BUN 69*  --  40* 30* 21  CREATININE 1.40*  --  1.30* 1.24 1.04  CALCIUM 9.3  --  8.0* 7.5* 7.7*   GFR: Estimated Creatinine Clearance: 45.7 mL/min (by C-G formula based on SCr of 1.04 mg/dL). Liver Function Tests: Recent Labs  Lab 04/24/18 1755  AST 20  ALT 18  ALKPHOS 61  BILITOT 0.5  PROT 5.7*  ALBUMIN 3.3*   No results for input(s): LIPASE, AMYLASE in the last 168 hours. No results for input(s): AMMONIA in the last 168 hours. Coagulation Profile: Recent Labs  Lab 04/24/18 1758  INR 1.17   Cardiac Enzymes: No results for input(s): CKTOTAL, CKMB, CKMBINDEX, TROPONINI in the last 168 hours. BNP (last 3 results) Recent Labs    04/19/18 0959  PROBNP 196   HbA1C: No results for input(s): HGBA1C in the last 72 hours. CBG: Recent Labs  Lab 04/26/18 1610 04/26/18 2123 04/27/18 0613 04/27/18 1236 04/27/18 1608  GLUCAP 259* 155* 95 197* 180*   Lipid Profile: No results for input(s): CHOL, HDL, LDLCALC,  TRIG, CHOLHDL, LDLDIRECT in the last 72 hours. Thyroid Function Tests: No results for input(s): TSH, T4TOTAL, FREET4, T3FREE, THYROIDAB in the last 72 hours. Anemia Panel: No results for input(s): VITAMINB12, FOLATE, FERRITIN, TIBC, IRON, RETICCTPCT in the last 72 hours. Urine analysis:    Component Value Date/Time   COLORURINE YELLOW 01/02/2017 1940   APPEARANCEUR CLEAR 01/02/2017 1940   LABSPEC 1.023 01/02/2017 1940   PHURINE 6.0 01/02/2017 1940   GLUCOSEU >=500 (A) 01/02/2017 1940   HGBUR SMALL (A) 01/02/2017 1940   BILIRUBINUR NEGATIVE 01/02/2017 1940   KETONESUR 5 (A) 01/02/2017 1940   PROTEINUR 100 (A) 01/02/2017 1940   UROBILINOGEN 1.0 10/12/2013 1954   NITRITE NEGATIVE 01/02/2017 1940   LEUKOCYTESUR NEGATIVE 01/02/2017 1940   Sepsis Labs: @LABRCNTIP (procalcitonin:4,lacticacidven:4)  )No results found for this or  any previous visit (from the past 240 hour(s)).       Radiology Studies: Ct Abdomen Pelvis W Contrast  Result Date: 04/27/2018 CLINICAL DATA:  Diarrhea and bloody stools for the past 4 days. Nausea and vomiting. EXAM: CT ABDOMEN AND PELVIS WITH CONTRAST TECHNIQUE: Multidetector CT imaging of the abdomen and pelvis was performed using the standard protocol following bolus administration of intravenous contrast. CONTRAST:  147mL OMNIPAQUE IOHEXOL 300 MG/ML  SOLN COMPARISON:  Abdomen and pelvis radiograph dated 12/01/2013. Chest, abdomen and pelvis CTA dated 10/12/2013. FINDINGS: Lower chest: Small bilateral pleural effusions. Mild left lower lobe and minimal right lower lobe atelectasis. Small hiatal hernia. Hepatobiliary: No focal liver abnormality is seen. No gallstones, gallbladder wall thickening, or biliary dilatation. Pancreas: Diffuse pancreatic atrophy. Spleen: Normal in size without focal abnormality. Adrenals/Urinary Tract: Adrenal glands are unremarkable. Kidneys are normal, without renal calculi, focal lesion, or hydronephrosis. Bladder is unremarkable. Stomach/Bowel: Small hiatal hernia. Multiple descending and proximal sigmoid colon diverticula. No evidence of diverticulitis. Normal appearing appendix and small bowel. Vascular/Lymphatic: Atheromatous arterial calcifications without aneurysm. No enlarged lymph nodes. Reproductive: Mildly enlarged prostate gland. Other: Small to moderate-sized umbilical hernia containing fat. Small bilateral inguinal hernias containing fat. Anterior skin thickening at the level of the mid abdomen on the left with mild underlying soft tissue stranding in the subcutaneous fat. This area measures 7.9 x 0.9 cm on image number 35 series 3. There was a similar appearance on 10/13/1998 15, slightly less prominent at that time. Musculoskeletal: Extensive lumbar and lower thoracic spine degenerative changes. IMPRESSION: 1. No acute abnormality. 2. Small bilateral pleural  effusions. 3. Mild left lower lobe and minimal right lower lobe atelectasis. 4. Small hiatal hernia. 5. Descending and proximal sigmoid colon diverticulosis. 6. Small to moderate-sized umbilical hernia containing fat and small bilateral inguinal hernias containing fat. 7. Mildly prominent chronic soft tissue thickening and mild underlying soft tissue stranding in the subcutaneous fat in the left anterior mid abdomen. 8. Diffuse pancreatic atrophy Electronically Signed   By: Claudie Revering M.D.   On: 04/27/2018 13:49        Scheduled Meds: . sodium chloride   Intravenous Once  . atorvastatin  40 mg Oral q morning - 10a  . clopidogrel  75 mg Oral Daily  . FLUoxetine  20 mg Oral Daily  . insulin aspart  0-9 Units Subcutaneous TID WC  . insulin NPH Human  13 Units Subcutaneous BID AC & HS  . isosorbide mononitrate  60 mg Oral Daily  . metoprolol succinate  25 mg Oral Daily  . multivitamin with minerals  1 tablet Oral Daily  . pantoprazole  40 mg Oral BID AC   Continuous  Infusions: . sodium chloride 50 mL/hr at 04/27/18 0000     LOS: 2 days    Time spent: 25 minutes    Edwin Dada, MD [facility] 04/27/2018, 4:23 PM

## 2018-04-27 NOTE — Progress Notes (Signed)
[facility] Gastroenterology Progress Note  Seth Abbott 84 y.o. 1927-06-23   Subjective: Feels ok. Denies abdominal pain/N/V. Family at bedside.  Objective: Vital signs: Vitals:   04/27/18 0400 04/27/18 0801  BP: 119/64 (!) 148/74  Pulse: 77 80  Resp: (!) 23 20  Temp: 97.7 F (36.5 C) 98.1 F (36.7 C)  SpO2: 92% 96%    Physical Exam: Gen: lethargic, elderly, frail, well-nourished, no acute distress  HEENT: anicteric sclera CV: RRR Chest: CTA B Abd: LLQ tenderness without guarding, otherwise nontender, soft, nondistended, +BS Ext: no edema  Lab Results: Recent Labs    04/26/18 0352 04/27/18 0300  NA 140 141  K 3.6 3.2*  CL 112* 114*  CO2 22 22  GLUCOSE 295* 95  BUN 30* 21  CREATININE 1.24 1.04  CALCIUM 7.5* 7.7*   Recent Labs    04/24/18 1755  AST 20  ALT 18  ALKPHOS 61  BILITOT 0.5  PROT 5.7*  ALBUMIN 3.3*   Recent Labs    04/26/18 0352 04/27/18 0300  WBC 4.2 3.9*  HGB 7.4* 8.0*  HCT 22.8* 25.4*  MCV 92.7 93.7  PLT 127* 128*      Assessment/Plan: GI bleed - small nonbleeding duodenal ulcers seen on EGD 04/25/18. Awaiting abd CT. If negative then f/u in office with Dr. Watt Climes in 1 month but if colonic abnormality seen then will need an updated colonoscopy as an inpt. NPO until CT scan done. Will follow.   Seth Abbott 04/27/2018, 11:21 AM  Questions please call [phone] ID: Seth Abbott, male   DOB: 1927-09-17, 84 y.o.   MRN: [id number]

## 2018-04-28 LAB — BASIC METABOLIC PANEL
Anion gap: 7 (ref 5–15)
BUN: 12 mg/dL (ref 8–23)
CHLORIDE: 106 mmol/L (ref 98–111)
CO2: 21 mmol/L — ABNORMAL LOW (ref 22–32)
Calcium: 8 mg/dL — ABNORMAL LOW (ref 8.9–10.3)
Creatinine, Ser: 0.95 mg/dL (ref 0.61–1.24)
Glucose, Bld: 173 mg/dL — ABNORMAL HIGH (ref 70–99)
POTASSIUM: 3.5 mmol/L (ref 3.5–5.1)
SODIUM: 134 mmol/L — AB (ref 135–145)

## 2018-04-28 LAB — CBC
HEMATOCRIT: 25.1 % — AB (ref 39.0–52.0)
HEMOGLOBIN: 8 g/dL — AB (ref 13.0–17.0)
MCH: 29.9 pg (ref 26.0–34.0)
MCHC: 31.9 g/dL (ref 30.0–36.0)
MCV: 93.7 fL (ref 80.0–100.0)
Platelets: 106 10*3/uL — ABNORMAL LOW (ref 150–400)
RBC: 2.68 MIL/uL — ABNORMAL LOW (ref 4.22–5.81)
RDW: 13.9 % (ref 11.5–15.5)
WBC: 3.7 10*3/uL — AB (ref 4.0–10.5)
nRBC: 0 % (ref 0.0–0.2)

## 2018-04-28 LAB — GLUCOSE, CAPILLARY
GLUCOSE-CAPILLARY: 191 mg/dL — AB (ref 70–99)
GLUCOSE-CAPILLARY: 239 mg/dL — AB (ref 70–99)

## 2018-04-28 MED ORDER — POLYETHYLENE GLYCOL 3350 17 G PO PACK: 17.0000 g | PACK | Freq: Every day | ORAL | 0 refills | Status: AC | PRN

## 2018-04-28 MED ORDER — IPRATROPIUM-ALBUTEROL 0.5-2.5 (3) MG/3ML IN SOLN
3.0000 mL | RESPIRATORY_TRACT | Status: DC | PRN
  Administered 2018-04-28: 3 mL via RESPIRATORY_TRACT
  Filled 2018-04-28: qty 3

## 2018-04-28 MED ORDER — PANTOPRAZOLE SODIUM 40 MG PO TBEC: 40.0000 mg | DELAYED_RELEASE_TABLET | Freq: Two times a day (BID) | ORAL | 0 refills | Status: DC

## 2018-04-28 MED ORDER — FERROUS SULFATE 325 (65 FE) MG PO TBEC: 325.0000 mg | DELAYED_RELEASE_TABLET | Freq: Two times a day (BID) | ORAL | 3 refills | Status: DC

## 2018-04-28 NOTE — Progress Notes (Signed)
[facility] Gastroenterology Progress Note  Seth Abbott 84 y.o. [birth year]   Subjective: Sitting in bedside chair. Denies abdominal pain. Son in room.  Objective: Vital signs: Vitals:   04/28/18 0500 04/28/18 0831  BP: (!) 141/52 (!) 167/67  Pulse: 78 73  Resp: (!) 21 (!) 23  Temp: 97.7 F (36.5 C) 98 F (36.7 C)  SpO2: 93% 98%    Physical Exam: Gen: alert, no acute distress, elderly, well-nourished HEENT: anicteric sclera CV: RRR Chest: CTA B Abd: soft, nontender, nondistended, +BS  Lab Results: Recent Labs    04/27/18 0300 04/28/18 0832  NA 141 134*  K 3.2* 3.5  CL 114* 106  CO2 22 21*  GLUCOSE 95 173*  BUN 21 12  CREATININE 1.04 0.95  CALCIUM 7.7* 8.0*   No results for input(s): AST, ALT, ALKPHOS, BILITOT, PROT, ALBUMIN in the last 72 hours. Recent Labs    04/27/18 0300 04/28/18 0355  WBC 3.9* 3.7*  HGB 8.0* 8.0*  HCT 25.4* 25.1*  MCV 93.7 93.7  PLT 128* 106*      Assessment/Plan: GI bleed likely from duodenal ulcers that has resolved. CT negative for acute findings. Agree with d/c today and f/u with Dr. Watt Climes in 1 month. Will sign off. Call if questions.   Seth Abbott 04/28/2018, 10:46 AM  Questions please call [phone] ID: Seth Abbott, male   DOB: 1927-09-06, 84 y.o.   MRN: [id number]

## 2018-04-28 NOTE — Discharge Instructions (Signed)
You were cared for by a hospitalist during your hospital stay. If you have any questions about your discharge medications or the care you received while you were in the hospital after you are discharged, you can call the unit and asked to speak with the hospitalist on call if the hospitalist that took care of you is not available. Once you are discharged, your primary care physician will handle any further medical issues.   Please note that NO REFILLS for any discharge medications will be authorized once you are discharged, as it is imperative that you return to your primary care physician (or establish a relationship with a primary care physician if you do not have one) for your aftercare needs so that they can reassess your need for medications and monitor your lab values.  Please take all your medications with you for your next visit with your Primary MD. Please ask your Primary MD to get all Hospital records sent to his/her office. Please request your Primary MD to go over all hospital test results at the follow up.   If you experience worsening of your admission symptoms, develop shortness of breath, chest pain, suicidal or homicidal thoughts or a life threatening emergency, you must seek medical attention immediately by calling 911 or calling your MD.   Dennis Bast must read the complete instructions/literature along with all the possible adverse reactions/side effects for all the medicines you take including new medications that have been prescribed to you. Take new medicines after you have completely understood and accpet all the possible adverse reactions/side effects.    Do not drive when taking pain medications or sedatives.     Do not take more than prescribed Pain, Sleep and Anxiety Medications   If you have smoked or chewed Tobacco in the last 2 yrs please stop. Stop any regular alcohol  and or recreational drug use.   Wear Seat belts while driving. Bland Diet A bland diet consists of foods  that do not have a lot of fat or fiber. Foods without fat or fiber are easier for the body to digest. They are also less likely to irritate your mouth, throat, stomach, and other parts of your gastrointestinal tract. A bland diet is sometimes called a BRAT diet. What is my plan? Your health care provider or dietitian may recommend specific changes to your diet to prevent and treat your symptoms, such as:  Eating small meals often.  Cooking food until it is soft enough to chew easily.  Chewing your food well.  Drinking fluids slowly.  Not eating foods that are very spicy, sour, or fatty.  Not eating citrus fruits, such as oranges and grapefruit.  What do I need to know about this diet?  Eat a variety of foods from the bland diet food list.  Do not follow a bland diet longer than you have to.  Ask your health care provider whether you should take vitamins. What foods can I eat? Grains  Hot cereals, such as cream of wheat. Bread, crackers, or tortillas made from refined white flour. Rice. Vegetables Canned or cooked vegetables. Mashed or boiled potatoes. Fruits Bananas. Applesauce. Other types of cooked or canned fruit with the skin and seeds removed, such as canned peaches or pears. Meats and Other Protein Sources Scrambled eggs. Creamy peanut butter or other nut butters. Lean, well-cooked meats, such as chicken or fish. Tofu. Soups or broths. Dairy Low-fat dairy products, such as milk, cottage cheese, or yogurt. Beverages Water. Herbal tea. Apple juice.  Sweets and Desserts Pudding. Custard. Fruit gelatin. Ice cream. Fats and Oils Mild salad dressings. Canola or olive oil. The items listed above may not be a complete list of allowed foods or beverages. Contact your dietitian for more options. What foods are not recommended? Foods and ingredients that are often not recommended include:  Spicy foods, such as hot sauce or salsa.  Fried foods.  Sour foods, such as pickled  or fermented foods.  Raw vegetables or fruits, especially citrus or berries.  Caffeinated drinks.  Alcohol.  Strongly flavored seasonings or condiments.  The items listed above may not be a complete list of foods and beverages that are not allowed. Contact your dietitian for more information. This information is not intended to replace advice given to you by your health care provider. Make sure you discuss any questions you have with your health care provider. Document Released: 09/20/2015 Document Revised: 11/04/2015 Document Reviewed: 06/10/2014 Elsevier Interactive Patient Education  2018 [employer].

## 2018-04-28 NOTE — Evaluation (Signed)
Physical Therapy Evaluation Patient Details Name: Seth Abbott MRN: [id number] DOB: 12-01-1927 Today's Date: 04/28/2018   History of Present Illness  84 y.o. M with hx CAD s/p PCI, last >4 yrs, hx TIA, HTN, DM, CKD who presented with hematochezia and melena  Clinical Impression  Orders received for PT evaluation. Patient demonstrates deficits in functional mobility as indicated below. Will benefit from continued skilled PT to address deficits and maximize function. Will see as indicated and progress as tolerated.  Recommend supervision upon initial discharge home. Patient is close to mobility baseline (pre his report). No follow up PT at this time.    Follow Up Recommendations No PT follow up;Supervision for mobility/OOB    Equipment Recommendations  None recommended by PT    Recommendations for Other Services       Precautions / Restrictions Precautions Precautions: Fall      Mobility  Bed Mobility               General bed mobility comments: received in chair  Transfers Overall transfer level: Needs assistance Equipment used: None Transfers: Sit to/from Stand Sit to Stand: Supervision         General transfer comment: supervision for safety, modest instability during power up  Ambulation/Gait Ambulation/Gait assistance: Min guard Gait Distance (Feet): 180 Feet Assistive device: None Gait Pattern/deviations: Drifts right/left;Trunk flexed;Narrow base of support Gait velocity: decreased Gait velocity interpretation: <1.8 ft/sec, indicate of risk for recurrent falls General Gait Details: some noted instability but no overt LOB. 3 standing rest breaks due to dizziness and fatigue  Stairs            Wheelchair Mobility    Modified Rankin (Stroke Patients Only)       Balance Overall balance assessment: Mild deficits observed, not formally tested                                           Pertinent Vitals/Pain Pain  Assessment: No/denies pain    Home Living Family/patient expects to be discharged to:: Private residence Living Arrangements: Alone Available Help at Discharge: Family;Available 24 hours/day Type of Home: House Home Access: Ramped entrance     Home Layout: One level Home Equipment: Walker - 2 wheels;Wheelchair - manual;Bedside commode;Cane - single point      Prior Function Level of Independence: Independent         Comments: uses RW on bad days     Hand Dominance   Dominant Hand: Right    Extremity/Trunk Assessment   Upper Extremity Assessment Upper Extremity Assessment: Generalized weakness    Lower Extremity Assessment Lower Extremity Assessment: Generalized weakness       Communication   Communication: HOH  Cognition Arousal/Alertness: Awake/alert Behavior During Therapy: WFL for tasks assessed/performed Overall Cognitive Status: No family/caregiver present to determine baseline cognitive functioning                                        General Comments      Exercises     Assessment/Plan    PT Assessment All further PT needs can be met in the next venue of care  PT Problem List Decreased strength;Decreased activity tolerance;Decreased balance;Decreased mobility       PT Treatment Interventions      PT Goals (Current  goals can be found in the Care Plan section)  Acute Rehab PT Goals Patient Stated Goal: to go home PT Goal Formulation: With patient Time For Goal Achievement: 05/12/18 Potential to Achieve Goals: Good    Frequency     Barriers to discharge        Co-evaluation               AM-PAC PT "6 Clicks" Daily Activity  Outcome Measure Difficulty turning over in bed (including adjusting bedclothes, sheets and blankets)?: A Little Difficulty moving from lying on back to sitting on the side of the bed? : A Little Difficulty sitting down on and standing up from a chair with arms (e.g., wheelchair, bedside  commode, etc,.)?: A Little Help needed moving to and from a bed to chair (including a wheelchair)?: A Little Help needed walking in hospital room?: A Little Help needed climbing 3-5 steps with a railing? : A Little 6 Click Score: 18    End of Session Equipment Utilized During Treatment: Gait belt Activity Tolerance: Patient limited by fatigue Patient left: in chair;with call bell/phone within reach;with chair alarm set Nurse Communication: Mobility status PT Visit Diagnosis: Other abnormalities of gait and mobility (R26.89);Difficulty in walking, not elsewhere classified (R26.2)    Time: 1610-9604 PT Time Calculation (min) (ACUTE ONLY): 21 min   Charges:   PT Evaluation $PT Eval Moderate Complexity: 1 Mod          Alben Deeds, PT DPT  Board Certified Neurologic Specialist Acute Rehabilitation Services Pager [phone] Office [phone]   Duncan Dull 04/28/2018, 9:29 AM

## 2018-04-28 NOTE — Discharge Summary (Signed)
Physician Discharge Summary  BLASE BECKNER YOV:[id number] DOB: 1927-10-10 DOA: 04/24/2018  PCP: [name], DO  Admit date: 04/24/2018 Discharge date: 04/28/2018  Admitted From: home Disposition:  home   Recommendations for Outpatient Follow-up:  1. Needs outpt anemia panel and f/u HB in 1 wk 2. Resume Aspirin in 1 wk if no further bleeding 3. F/u with Dr Watt Climes in 1 mo 4. F/u on biopsies obtained during EGD  Discharge Condition:  stable   CODE STATUS:  Full code   Diet recommendation:  Bland, heart healthy diet Consultations:  [facility]- [facility]    Discharge Diagnoses:  Principal Problem:   GIB (gastrointestinal bleeding) Active Problems:   Hyperkalemia   Lactic acidosis   Symptomatic acute blood loss anemia   Depression, recurrent (HCC)   CAD (coronary artery disease), native coronary artery   Hyperlipidemia LDL goal <70   HTN (hypertension)   Type II diabetes mellitus with renal manifestations (HCC)   GERD (gastroesophageal reflux disease)    Brief Summary: Mr. Roper is a 84 y.o. M with hx CAD s/p PCI, last >4 yrs, hx TIA, HTN, DM, CKD who presented with hematochezia and melena, lactate 4.4 and Hgb 9 down from baseline 13 g/dL in August.  Hospital Course:  Upper GI bleed Melena Endoscopy on 11/14 showed duodenal ulcers, no high risk features.  -  CT abdomen > hernia, no colonic masses or abnormalities. - Protonix 20 mg daily increased to 40 mg BID- patient thinks this is making him jittery but he is willing to try it for a week and if still having side effects, will d/w his PCP to change it - f/u with Gastroenterology, Dr Watt Climes in 1 mo - discussed dietary changes - discussed plan with patient and son    Acute blood loss anemia Transfused 1 U on 11/13 and 1 on 11/15.    - Hgb subsequently ~ 8 g/dL.   -Transfusion threshold 8 g/dL given CAD -Started oral iron BID at discharge- discussed constipation risk and ordered Miralax  Chronic thrombocytopenia -  stable  Dehydration AKI ruled out Cr back to baseline 1.1 with fluids.  Hypertension Coronary disease History of TIA - BP stable -Continue Plavix which was resumed 3 days ago - he cannot recall if he takes Aspirin but his last cardiolgoy visit notes that he is to be on both - this has been on hold in the hospital- stent was placed in 2018 -Continue Imdur and metoprolol  Diaebtes - cont home doses of insulin  Other medications -Continue fluoxetine   Discharge Exam: Vitals:   04/28/18 0500 04/28/18 0831  BP: (!) 141/52 (!) 167/67  Pulse: 78 73  Resp: (!) 21 (!) 23  Temp: 97.7 F (36.5 C) 98 F (36.7 C)  SpO2: 93% 98%   Vitals:   04/27/18 1720 04/27/18 1947 04/28/18 0500 04/28/18 0831  BP: 123/82 116/61 (!) 141/52 (!) 167/67  Pulse: 75 80 78 73  Resp: (!) 25 (!) 25 (!) 21 (!) 23  Temp: 97.7 F (36.5 C) (!) 97.3 F (36.3 C) 97.7 F (36.5 C) 98 F (36.7 C)  TempSrc: Oral Oral Oral Oral  SpO2: 90% 93% 93% 98%  Weight:   85.5 kg   Height:        General: Pt is alert, awake, not in acute distress Cardiovascular: RRR, S1/S2 +, no rubs, no gallops Respiratory: CTA bilaterally, no wheezing, no rhonchi Abdominal: Soft, NT, ND, bowel sounds + Extremities: no edema, no cyanosis   Discharge Instructions  Discharge Instructions    Diet - low sodium heart healthy   Complete by:  As directed    Avoid caffeine, spicy and oily foods   Diet Carb Modified   Complete by:  As directed    Increase activity slowly   Complete by:  As directed      Allergies as of 04/28/2018   No Known Allergies     Medication List    STOP taking these medications   aspirin 81 MG tablet     TAKE these medications   ACCU-CHEK AVIVA PLUS test strip Generic drug:  glucose blood USE TO CHECK BLOOD SUGAR 3 TIMES DAILY   atorvastatin 40 MG tablet Commonly known as:  LIPITOR Take 1 tablet (40 mg total) by mouth every morning.   clopidogrel 75 MG tablet Commonly known as:   PLAVIX TAKE 1 TABLET BY MOUTH EVERY DAY   ferrous sulfate 325 (65 FE) MG EC tablet Take 1 tablet (325 mg total) by mouth 2 (two) times daily.   FLUoxetine 20 MG tablet Commonly known as:  PROZAC TAKE 1 TABLET BY MOUTH EVERY DAY   HUMALOG 100 UNIT/ML cartridge Generic drug:  insulin lispro Inject 10-15 Units into the skin 2 (two) times daily. Pt ranges his insulin with his blood sugar readings Notes to patient:  Take as instructed   insulin NPH Human 100 UNIT/ML injection Commonly known as:  HUMULIN N,NOVOLIN N Inject 25 Units into the skin 2 (two) times daily before a meal. Notes to patient:  Take as instructed   isosorbide mononitrate 60 MG 24 hr tablet Commonly known as:  IMDUR Take 1 tablet (60 mg total) by mouth daily.   metoprolol succinate 25 MG 24 hr tablet Commonly known as:  TOPROL-XL TAKE 1 TABLET BY MOUTH DAILY . TAKE WITH OR IMMEDIATELY FOLLOWING A MEAL   multivitamin with minerals tablet Take 1 tablet by mouth daily.   nitroGLYCERIN 0.4 MG SL tablet Commonly known as:  NITROSTAT Place 1 tablet (0.4 mg total) under the tongue every 5 (five) minutes as needed for chest pain (up to 3 doses).   pantoprazole 40 MG tablet Commonly known as:  PROTONIX Take 1 tablet (40 mg total) by mouth 2 (two) times daily before a meal. What changed:    medication strength  how much to take  when to take this   polyethylene glycol packet Commonly known as:  MIRALAX / GLYCOLAX Take 17 g by mouth daily as needed.      Follow-up Information    [name], DO. Schedule an appointment as soon as possible for a visit.   Specialty:  Family Medicine Why:  needs Anemia panel, CBC and Bmet in 1 wk Aspirin to be resumed in 1 wk if no further bleeding noted.  Contact information: [place] [address] [phone]        Sueanne Margarita, MD .   Specialty:  Cardiology Contact information: [phone] [place]  [address] [phone]        Clarene Essex, MD. Schedule an appointment as soon as possible for a visit in 1 month(s).   Specialty:  Gastroenterology Contact information: [address] [place] [address] [phone]          No Known Allergies   Procedures/Studies:   EGD Findings: The larynx was normal. A small hiatal hernia was present. Localized mild inflammation characterized by erythema was found in the  gastric antrum. Biopsies  were taken with a cold forceps for histology. Two non-bleeding cratered duodenal ulcers with no stigmata of bleeding  were found in the duodenal bulb. The second portion of the duodenum and third portion of the duodenum  were normal. The exam was otherwise without abnormality. Impression: - Normal larynx. - Small hiatal hernia. - Caustic gastritis. Biopsied. - Multiple non-bleeding duodenal ulcers with no  stigmata of bleeding. - Normal second portion of the duodenum and third  portion of the duodenum. - The examination was otherwise normal. Recommendation: - Patient has a contact number available for  emergencies. The signs and symptoms of potential  delayed complications were discussed with the  patient. Return to normal activities tomorrow.  Written discharge instructions were provided to the  patient. - Soft diet today. consider CT to rule out any  other significant abnormality and okay to hold  colonoscopy for now - Continue present  medications. check with  cardiology to see if he can be managed with just  Plavix only and holding aspirin and continue once a  day Protonix at home but increased to 40 mg - Await pathology results. call in 1 week for  biopsy results - Return to GI clinic in 4 weeks. - Telephone GI clinic if symptomatic PRN.   Ct Abdomen Pelvis W Contrast  Result Date: 04/27/2018 CLINICAL DATA:  Diarrhea and bloody stools for the past 4 days. Nausea and vomiting. EXAM: CT ABDOMEN AND PELVIS WITH CONTRAST TECHNIQUE: Multidetector CT imaging of the abdomen and pelvis was performed using the standard protocol following bolus administration of intravenous contrast. CONTRAST:  156mL OMNIPAQUE IOHEXOL 300 MG/ML  SOLN COMPARISON:  Abdomen and pelvis radiograph dated 12/01/2013. Chest, abdomen and pelvis CTA dated 10/12/2013. FINDINGS: Lower chest: Small bilateral pleural effusions. Mild left lower lobe and minimal right lower lobe atelectasis. Small hiatal hernia. Hepatobiliary: No focal liver abnormality is seen. No gallstones, gallbladder wall thickening, or biliary dilatation. Pancreas: Diffuse pancreatic atrophy. Spleen: Normal in size without focal abnormality. Adrenals/Urinary Tract: Adrenal glands are unremarkable. Kidneys are normal, without renal calculi, focal lesion, or hydronephrosis. Bladder is unremarkable. Stomach/Bowel: Small hiatal hernia. Multiple descending and proximal sigmoid colon diverticula. No evidence of diverticulitis. Normal appearing appendix and small bowel. Vascular/Lymphatic: Atheromatous arterial calcifications without aneurysm. No enlarged lymph nodes. Reproductive: Mildly enlarged prostate gland. Other: Small to moderate-sized umbilical hernia containing fat. Small bilateral inguinal hernias containing fat.  Anterior skin thickening at the level of the mid abdomen on the left with mild underlying soft tissue stranding in the subcutaneous fat. This area measures 7.9 x 0.9 cm on image number 35 series 3. There was a similar appearance on 10/13/1998 15, slightly less prominent at that time. Musculoskeletal: Extensive lumbar and lower thoracic spine degenerative changes. IMPRESSION: 1. No acute abnormality. 2. Small bilateral pleural effusions. 3. Mild left lower lobe and minimal right lower lobe atelectasis. 4. Small hiatal hernia. 5. Descending and proximal sigmoid colon diverticulosis. 6. Small to moderate-sized umbilical hernia containing fat and small bilateral inguinal hernias containing fat. 7. Mildly prominent chronic soft tissue thickening and mild underlying soft tissue stranding in the subcutaneous fat in the left anterior mid abdomen. 8. Diffuse pancreatic atrophy Electronically Signed   By: Claudie Revering M.D.   On: 04/27/2018 13:49     The results of significant diagnostics from this hospitalization (including imaging, microbiology, ancillary and laboratory) are listed below for reference.     Microbiology: No results found for this or any previous visit (from the past 240 hour(s)).  Labs: BNP (last 3 results) Recent Labs    04/24/18 2018  BNP 42.3   Basic Metabolic Panel: Recent Labs  Lab 04/24/18 1755 04/24/18 2226 04/25/18 1531 04/26/18 0352 04/27/18 0300 04/28/18 0832  NA 133*  --  138 140 141 134*  K 6.5* 4.4 3.7 3.6 3.2* 3.5  CL 103  --  110 112* 114* 106  CO2 20*  --  22 22 22  21*  GLUCOSE 450*  --  231* 295* 95 173*  BUN 69*  --  40* 30* 21 12  CREATININE 1.40*  --  1.30* 1.24 1.04 0.95  CALCIUM 9.3  --  8.0* 7.5* 7.7* 8.0*   Liver Function Tests: Recent Labs  Lab 04/24/18 1755  AST 20  ALT 18  ALKPHOS 61  BILITOT 0.5  PROT 5.7*  ALBUMIN 3.3*   No results for input(s): LIPASE, AMYLASE in the last 168 hours. No results for input(s): AMMONIA in the last 168  hours. CBC: Recent Labs  Lab 04/25/18 0658 04/25/18 1531 04/26/18 0352 04/27/18 0300 04/28/18 0355  WBC 6.0 6.3 4.2 3.9* 3.7*  HGB 8.4* 8.3* 7.4* 8.0* 8.0*  HCT 25.7* 25.2* 22.8* 25.4* 25.1*  MCV 92.4 92.6 92.7 93.7 93.7  PLT 150 146* 127* 128* 106*   Cardiac Enzymes: No results for input(s): CKTOTAL, CKMB, CKMBINDEX, TROPONINI in the last 168 hours. BNP: Invalid input(s): POCBNP CBG: Recent Labs  Lab 04/27/18 0613 04/27/18 1236 04/27/18 1608 04/27/18 2152 04/28/18 0544  GLUCAP 95 197* 180* 200* 191*   D-Dimer No results for input(s): DDIMER in the last 72 hours. Hgb A1c No results for input(s): HGBA1C in the last 72 hours. Lipid Profile No results for input(s): CHOL, HDL, LDLCALC, TRIG, CHOLHDL, LDLDIRECT in the last 72 hours. Thyroid function studies No results for input(s): TSH, T4TOTAL, T3FREE, THYROIDAB in the last 72 hours.  Invalid input(s): FREET3 Anemia work up No results for input(s): VITAMINB12, FOLATE, FERRITIN, TIBC, IRON, RETICCTPCT in the last 72 hours. Urinalysis    Component Value Date/Time   COLORURINE YELLOW 01/02/2017 1940   APPEARANCEUR CLEAR 01/02/2017 1940   LABSPEC 1.023 01/02/2017 1940   PHURINE 6.0 01/02/2017 1940   GLUCOSEU >=500 (A) 01/02/2017 1940   HGBUR SMALL (A) 01/02/2017 1940   BILIRUBINUR NEGATIVE 01/02/2017 1940   KETONESUR 5 (A) 01/02/2017 1940   PROTEINUR 100 (A) 01/02/2017 1940   UROBILINOGEN 1.0 10/12/2013 1954   NITRITE NEGATIVE 01/02/2017 1940   LEUKOCYTESUR NEGATIVE 01/02/2017 1940   Sepsis Labs Invalid input(s): PROCALCITONIN,  WBC,  LACTICIDVEN Microbiology No results found for this or any previous visit (from the past 240 hour(s)).   Time coordinating discharge in minutes: 65  SIGNED:   Debbe Odea, MD  [facility] 04/28/2018, 10:38 AM Pager   If 7PM-7AM, please contact night-coverage [email or web address] Password [email or web address]

## 2018-04-28 NOTE — Progress Notes (Signed)
Seth Abbott to be D/C'd Home per MD order. Discussed with the patient and all questions fully answered.    IV catheter discontinued intact. Site without signs and symptoms of complications. Dressing and pressure applied.  An After Visit Summary was printed and given to the patient.  Patient escorted via [place], and D/C home via private auto.  [place] Numbers  04/28/2018 11:40 AM

## 2018-04-29 ENCOUNTER — Encounter (HOSPITAL_COMMUNITY): Payer: Self-pay | Admitting: Gastroenterology

## 2018-05-06 ENCOUNTER — Ambulatory Visit (INDEPENDENT_AMBULATORY_CARE_PROVIDER_SITE_OTHER): Payer: Medicare Other | Admitting: Family Medicine

## 2018-05-06 ENCOUNTER — Encounter: Payer: Self-pay | Admitting: Family Medicine

## 2018-05-06 VITALS — BP 137/70 | HR 64 | Temp 97.5°F | Ht 65.0 in | Wt 191.2 lb

## 2018-05-06 DIAGNOSIS — Z794 Long term (current) use of insulin: Secondary | ICD-10-CM

## 2018-05-06 DIAGNOSIS — I2510 Atherosclerotic heart disease of native coronary artery without angina pectoris: Secondary | ICD-10-CM | POA: Diagnosis not present

## 2018-05-06 DIAGNOSIS — F339 Major depressive disorder, recurrent, unspecified: Secondary | ICD-10-CM | POA: Diagnosis not present

## 2018-05-06 DIAGNOSIS — E1129 Type 2 diabetes mellitus with other diabetic kidney complication: Secondary | ICD-10-CM

## 2018-05-06 DIAGNOSIS — D511 Vitamin B12 deficiency anemia due to selective vitamin B12 malabsorption with proteinuria: Secondary | ICD-10-CM | POA: Diagnosis not present

## 2018-05-06 DIAGNOSIS — N179 Acute kidney failure, unspecified: Secondary | ICD-10-CM | POA: Diagnosis not present

## 2018-05-06 DIAGNOSIS — D649 Anemia, unspecified: Secondary | ICD-10-CM | POA: Diagnosis not present

## 2018-05-06 DIAGNOSIS — R809 Proteinuria, unspecified: Secondary | ICD-10-CM | POA: Diagnosis not present

## 2018-05-06 DIAGNOSIS — N183 Chronic kidney disease, stage 3 (moderate): Secondary | ICD-10-CM

## 2018-05-06 DIAGNOSIS — K264 Chronic or unspecified duodenal ulcer with hemorrhage: Secondary | ICD-10-CM | POA: Diagnosis not present

## 2018-05-06 DIAGNOSIS — K2960 Other gastritis without bleeding: Secondary | ICD-10-CM | POA: Insufficient documentation

## 2018-05-06 NOTE — Patient Instructions (Addendum)
Seth Abbott please make sure the A1c result gets to patient's endocrinologist [name].   Mr. Rago, please call Dr. Watt Climes for a follow-up appointment for 3 weeks from now.      Anemia  Anemia is a condition in which you do not have enough red blood cells or hemoglobin. Hemoglobin is a substance in red blood cells that carries oxygen. When you do not have enough red blood cells or hemoglobin (are anemic), your body cannot get enough oxygen and your organs may not work properly. As a result, you may feel very tired or have other problems. What are the causes? Common causes of anemia include:  Excessive bleeding. Anemia can be caused by excessive bleeding inside or outside the body, including bleeding from the intestine or from periods in women.  Poor nutrition.  Long-lasting (chronic) kidney, thyroid, and liver disease.  Bone marrow disorders.  Cancer and treatments for cancer.  HIV (human immunodeficiency virus) and AIDS (acquired immunodeficiency syndrome).  Treatments for HIV and AIDS.  Spleen problems.  Blood disorders.  Infections, medicines, and autoimmune disorders that destroy red blood cells.  What are the signs or symptoms? Symptoms of this condition include:  Minor weakness.  Dizziness.  Headache.  Feeling heartbeats that are irregular or faster than normal (palpitations).  Shortness of breath, especially with exercise.  Paleness.  Cold sensitivity.  Indigestion.  Nausea.  Difficulty sleeping.  Difficulty concentrating.  Symptoms may occur suddenly or develop slowly. If your anemia is mild, you may not have symptoms. How is this diagnosed? This condition is diagnosed based on:  Blood tests.  Your medical history.  A physical exam.  Bone marrow biopsy.  Your health care provider may also check your stool (feces) for blood and may do additional testing to look for the cause of your bleeding. You may also have other tests,  including:  Imaging tests, such as a CT scan or MRI.  Endoscopy.  Colonoscopy.  How is this treated? Treatment for this condition depends on the cause. If you continue to lose a lot of blood, you may need to be treated at a hospital. Treatment may include:  Taking supplements of iron, vitamin X21, or folic acid.  Taking a hormone medicine (erythropoietin) that can help to stimulate red blood cell growth.  Having a blood transfusion. This may be needed if you lose a lot of blood.  Making changes to your diet.  Having surgery to remove your spleen.  Follow these instructions at home:  Take over-the-counter and prescription medicines only as told by your health care provider.  Take supplements only as told by your health care provider.  Follow any diet instructions that you were given.  Keep all follow-up visits as told by your health care provider. This is important. Contact a health care provider if:  You develop new bleeding anywhere in the body. Get help right away if:  You are very weak.  You are short of breath.  You have pain in your abdomen or chest.  You are dizzy or feel faint.  You have trouble concentrating.  You have bloody or black, tarry stools.  You vomit repeatedly or you vomit up blood. Summary  Anemia is a condition in which you do not have enough red blood cells or enough of a substance in your red blood cells that carries oxygen (hemoglobin).  Symptoms may occur suddenly or develop slowly.  If your anemia is mild, you may not have symptoms.  This condition is diagnosed with  blood tests as well as a medical history and physical exam. Other tests may be needed.  Treatment for this condition depends on the cause of the anemia. This information is not intended to replace advice given to you by your health care provider. Make sure you discuss any questions you have with your health care provider. Document Released: 07/06/2004 Document Revised:  06/30/2016 Document Reviewed: 06/30/2016 Elsevier Interactive Patient Education  [employer].

## 2018-05-06 NOTE — Progress Notes (Signed)
Impression and Recommendations:    1. Caustic injury gastritis   2. Anemia, unspecified type   3. Symptomatic anemia   4. Coronary artery disease involving native coronary artery of native heart without angina pectoris   5. Acute renal failure superimposed on stage 3 chronic kidney disease, unspecified acute renal failure type ([place])   6. Type 2 diabetes mellitus with microalbuminuria, with long-term current use of insulin (HCC)   7. Depression, recurrent ([place])   8. Gastrointestinal hemorrhage associated with duodenal ulcer   9. Vitamin B12 deficiency anemia due to malabsorption with proteinuria     1. Hospital Follow Up - Recent GI Bleed - Encouraged patient to make follow-up appointments with specialists such as Dr. Watt Climes as recommended.  Patient understands the need to follow up with all specialists as indicated.  - Educated patient about importance of having GI follow-up with him after the GI bleed.  - Hemoglobin 10 days ago at 7.4.  Need for blood work today for re-assessment.   - Will assess hemoglobin, anemia panel, electrolytes, as well as kidney health. - Discussed importance of making sure pt blood counts continue to improve.  - Explained that after his GI bleed, the patient may feel more winded, more fatigued, and more tired in general, and it is very important that he has more stability through his cane, or ideally the walker. - Reviewed importance of stabilizing himself with walker at home.  - Discussed possibility of patient moving in with family members in the future for increased surveillance.  - Extensive education provided to patient today, especially regarding critical importance for ongoing specialty maintenance.   2. Mood - Advised patient to continue Prozac if he feels the medication is working well for him. - Discussed that he certainly does not need to continue to take Prozac.  However, reviewed that it can help with feelings of anxiety and  sadness.  - Patient would like to evaluate his medications and call back to let the clinic know what he's taking.  - Reviewed the "spokes of the wheel" of mood and health management.  Stressed the importance of ongoing prudent habits, including regular exercise, appropriate sleep hygiene, healthful dietary habits, and prayer/meditation to relax.  3. Lifestyle & Preventative Health Maintenance - Advised patient to continue working gently toward exercising to improve overall mental, physical, and emotional health.    Health counseling performed.  All questions answered.  - Encouraged patient to engage in daily physical activity as tolerated.  - Healthy dietary habits encouraged, including high iron, low-carb, and high amounts of lean protein in diet.   - Patient should also consume adequate amounts of water.  4. Follow-Up - Prescriptions refilled today PRN. - Lab work ordered today as recommended. - Otherwise, continue to return for CPE and chronic follow-up as scheduled.   - Patient knows to call in sooner if desired to address acute concerns.   Orders Placed This Encounter  Procedures  . Hemoglobin A1c  . Hemoglobin  . Vitamin B12  . Folate  . Iron and TIBC  . Ferritin  . Transferrin    There are no discontinued medications.   Gross side effects, risk and benefits, and alternatives of medications and treatment plan in general discussed with patient.  Patient is aware that all medications have potential side effects and we are unable to predict every side effect or drug-drug interaction that may occur.   Patient will call with any questions prior to using medication if  they have concerns.    Expresses verbal understanding and consents to current therapy and treatment regimen.  No barriers to understanding were identified.  Red flag symptoms and signs discussed in detail.  Patient expressed understanding regarding what to do in case of emergency\urgent symptoms  Please see AVS  handed out to patient at the end of our visit for further patient instructions/ counseling done pertaining to today's office visit.   Return for 2) Follow-up 3-4 months for blood pressure, anemia, mood check.    I have reviewed the above medical documentation for accuracy and completeness and I concur.  [name], DO 05/07/2018 5:39 PM     Note:  This note was prepared with assistance of Dragon voice recognition software. Occasional wrong-word or sound-a-like substitutions may have occurred due to the inherent limitations of voice recognition software.   This document serves as a record of services personally performed by [name], DO. It was created on her behalf by Toni Amend, a trained medical scribe. The creation of this record is based on the scribe's personal observations and the provider's statements to them.   I have reviewed the above medical documentation for accuracy and completeness and I concur.  [name], DO 05/07/2018 5:39 PM       -------------------------------------------------------------------------------------------------------------------    Subjective:     HPI: Seth Abbott is a 84 y.o. male who presents to [place] at [facility] today for issues as discussed below.  Patient is here today accompanied by his son.  Recent GI Bleed Here today for hospital follow-up.  Patient had a GI bleed.  He received 2 pints of blood in the hospital.  He was discharged eight days ago and has restarted his aspirin therapy.  Since discharge from the hospital, patient feels "back about normal now."  Denies blood in stools.  Says his dark stools are "not that dark anymore."  He is supposed to follow up with Dr. Watt Climes in 4 weeks, and understands the need to follow up with [name] in the near future.  Son states they held off on a colonoscopy because the findings seemed to be in the upper GI.  No acute abnormalities in the  patient's CT scan.  Patient felt a bit cruddy after the GI bleed.  Says "today I felt fairly good."  He's been eating and drinking fine since he's been home, "back to about normal."  His bowel movements have been regular, not too squishy or unusual.  Denies belly pain.  Patient does not like eating "a whole lot of meat or anything."  He takes a Centrum Silver multivitamin, generic.  Feels that gradually all of his symptoms are resolving.  Patient thinks he's feeling a little less depressed.  Patient says "I don't think I need no more medicine."  Mood Patient is unsure if he is taking Prozac.    Son says that he thinks his father discontinued use for a time, but has resumed.  Sleep Habits Says he sleeps fairly well, but he gets up several times to urinate.  Other than that, he goes right back to sleep.  Recent Feelings of SOB (Prior to Hospitalization) Feels that he is sometimes short of breath.  He saw cardiology for SOB on 04/19/2018, prior to his hospitalization.  States that his feeling of SOB gets worse at night, or if he has to walk "a good distance to the house," per son.  Ambulation at Home Has a walker at home that  he uses to help him walk around, time to time.  He has no near plans to move in with his sons at this time.  He lives next door to one son, and very close to his other son.  States that his sons can come to check on him at any time.   Wt Readings from Last 3 Encounters:  05/06/18 191 lb 3.2 oz (86.7 kg)  04/28/18 188 lb 6.4 oz (85.5 kg)  04/19/18 188 lb 12.8 oz (85.6 kg)   BP Readings from Last 3 Encounters:  05/06/18 137/70  04/28/18 (!) 167/67  04/19/18 118/68   Pulse Readings from Last 3 Encounters:  05/06/18 64  04/28/18 73  04/19/18 69   BMI Readings from Last 3 Encounters:  05/06/18 31.82 kg/m  04/28/18 36.19 kg/m  04/19/18 31.42 kg/m     Patient Care Team    Relationship Specialty Notifications Start End  [name], DO PCP - General  Family Medicine  01/31/17   Sueanne Margarita, MD PCP - Cardiology Cardiology  10/12/13    Comment: Santa Lighter, MD Consulting Physician Endocrinology  01/31/17      Patient Active Problem List   Diagnosis Date Noted  . Persistent microalbuminuria associated with type 2 diabetes mellitus ([place]) 08/23/2017    Priority: High  . Depression, recurrent ([place]) 06/25/2017    Priority: High  . Diabetic nephropathy with proteinuria ([place]) 01/31/2017    Priority: High  . Combined hyperlipidemia associated with type 2 diabetes mellitus ([place]) 01/31/2017    Priority: High  . Type II Diabetes mellitus with microalbuminuria, with long-term current use of insulin (HCC)     Priority: High  . Hypertension associated with diabetes ([place])     Priority: High  . S/P coronary artery stent placement     Priority: Medium  . h/o TIA due to embolism ([place])- 10-15 yrs ago     Priority: Medium  . Hyperlipidemia LDL goal <70 06/28/2017    Priority: Low  . Vitamin D insufficiency 02/08/2017    Priority: Low  . Aortic stenosis, mild     Priority: Low  . Gastrointestinal hemorrhage associated with duodenal ulcer 05/06/2018  . Caustic injury gastritis 05/06/2018  . HTN (hypertension) 04/24/2018  . Type II diabetes mellitus with renal manifestations ([place]) 04/24/2018  . TIA (transient ischemic attack) 04/24/2018  . GERD (gastroesophageal reflux disease) 04/24/2018  . Acute renal failure superimposed on stage 3 chronic kidney disease ([place]) 04/24/2018  . GIB (gastrointestinal bleeding) 04/24/2018  . Hyperkalemia 04/24/2018  . Lactic acidosis 04/24/2018  . Symptomatic anemia 04/24/2018  . Aortic insufficiency   . New onset SOB 02/25/2018  . CAD (coronary artery disease), native coronary artery 06/28/2017  . Adjustment disorder with mixed anxiety and depressed mood 02/08/2017  . Arthritis 02/04/2017  . Sepsis ([place]) 01/03/2017  . Acute metabolic encephalopathy 62/13/0865  . Hyponatremia 01/03/2017  .  Bronchitis 01/03/2017  . SOB (shortness of breath) 08/16/2015  . Abnormal PFTs (pulmonary function tests) 08/16/2015  . Dilated aortic root ([place])   . Prostatitis   . Shingles     Past Medical history, Surgical history, Family history, Social history, Allergies and Medications have been entered into the medical record, reviewed and changed as needed.    Current Meds  Medication Sig  . ACCU-CHEK AVIVA PLUS test strip USE TO CHECK BLOOD SUGAR 3 TIMES DAILY  . atorvastatin (LIPITOR) 40 MG tablet Take 1 tablet (40 mg total) by mouth every morning.  . clopidogrel (PLAVIX)  75 MG tablet TAKE 1 TABLET BY MOUTH EVERY DAY  . ferrous sulfate 325 (65 FE) MG EC tablet Take 1 tablet (325 mg total) by mouth 2 (two) times daily.  [name] FLUoxetine (PROZAC) 20 MG tablet TAKE 1 TABLET BY MOUTH EVERY DAY  . insulin lispro (HUMALOG) 100 UNIT/ML cartridge Inject 10-15 Units into the skin 2 (two) times daily. Pt ranges his insulin with his blood sugar readings  . insulin NPH Human (HUMULIN N,NOVOLIN N) 100 UNIT/ML injection Inject 25 Units into the skin 2 (two) times daily before a meal.   . isosorbide mononitrate (IMDUR) 60 MG 24 hr tablet Take 1 tablet (60 mg total) by mouth daily.  . metoprolol succinate (TOPROL-XL) 25 MG 24 hr tablet TAKE 1 TABLET BY MOUTH DAILY . TAKE WITH OR IMMEDIATELY FOLLOWING A MEAL  . Multiple Vitamins-Minerals (MULTIVITAMIN WITH MINERALS) tablet Take 1 tablet by mouth daily.  . nitroGLYCERIN (NITROSTAT) 0.4 MG SL tablet Place 1 tablet (0.4 mg total) under the tongue every 5 (five) minutes as needed for chest pain (up to 3 doses).  . pantoprazole (PROTONIX) 40 MG tablet Take 1 tablet (40 mg total) by mouth 2 (two) times daily before a meal.  . polyethylene glycol (MIRALAX) packet Take 17 g by mouth daily as needed.    Allergies:  No Known Allergies   Review of Systems:  A fourteen system review of systems was performed and found to be positive as per HPI.   Objective:   Blood  pressure 137/70, pulse 64, temperature (!) 97.5 F (36.4 C), height 5\' 5"  (1.651 m), weight 191 lb 3.2 oz (86.7 kg), SpO2 99 %. Body mass index is 31.82 kg/m. General:  Well Developed, well nourished, appropriate for stated age.  Neuro:  Alert and oriented,  extra-ocular muscles intact  HEENT:  Normocephalic, atraumatic, neck supple, no carotid bruits appreciated  Skin:  no gross rash, warm, pink. Cardiac:  RRR, S1 S2 Respiratory:  ECTA B/L and A/P, Not using accessory muscles, speaking in full sentences- unlabored. Vascular:  Ext warm, no cyanosis apprec.; cap RF less 2 sec. Psych:  No HI/SI, judgement and insight good, Euthymic mood. Full Affect.

## 2018-05-23 LAB — FERRITIN: FERRITIN: 51 ng/mL (ref 30–400)

## 2018-05-23 LAB — IRON AND TIBC
IRON: 39 ug/dL (ref 38–169)
Iron Saturation: 16 % (ref 15–55)
Total Iron Binding Capacity: 238 ug/dL — ABNORMAL LOW (ref 250–450)
UIBC: 199 ug/dL (ref 111–343)

## 2018-05-23 LAB — HEMOGLOBIN A1C
Est. average glucose Bld gHb Est-mCnc: 148 mg/dL
Hgb A1c MFr Bld: 6.8 % — ABNORMAL HIGH (ref 4.8–5.6)

## 2018-05-23 LAB — HEMOGLOBIN: Hemoglobin: 9.7 g/dL — ABNORMAL LOW (ref 13.0–17.7)

## 2018-05-23 LAB — FOLATE: Folate: >20 ng/mL (ref >3.0)

## 2018-05-23 LAB — TRANSFERRIN: Transferrin: 190 mg/dL — ABNORMAL LOW (ref 200–370)

## 2018-05-23 LAB — VITAMIN B12: Vitamin B-12: >2000 pg/mL — ABNORMAL HIGH (ref 232–1245)

## 2018-05-24 ENCOUNTER — Other Ambulatory Visit: Payer: Self-pay

## 2018-05-24 MED ORDER — PANTOPRAZOLE SODIUM 40 MG PO TBEC: 40.0000 mg | DELAYED_RELEASE_TABLET | Freq: Two times a day (BID) | ORAL | 0 refills | Status: DC

## 2018-05-24 NOTE — Telephone Encounter (Signed)
Patient was called by PCP office with lab results and is  requesting a refill on Protonix.  Patient has been on 20 mg qd  and it was increased to 40 mg bid when in the hospital on 04/24/2018. Medication has been managed and refilled by [name] in the past.  Please review and advise if refill at current dose or if patient needs to go back down to 20 mg daily.  Thank you. MPulliam, CMA/RT(R)

## 2018-05-28 DIAGNOSIS — D500 Iron deficiency anemia secondary to blood loss (chronic): Secondary | ICD-10-CM | POA: Diagnosis not present

## 2018-05-28 DIAGNOSIS — K921 Melena: Secondary | ICD-10-CM | POA: Diagnosis not present

## 2018-05-28 DIAGNOSIS — K270 Acute peptic ulcer, site unspecified, with hemorrhage: Secondary | ICD-10-CM | POA: Diagnosis not present

## 2018-05-28 LAB — CBC AND DIFFERENTIAL
HCT: 34 — AB (ref 41–53)
Hemoglobin: 11.1 — AB (ref 13.5–17.5)
Platelets: 203 (ref 150–399)
WBC: 4.1

## 2018-05-31 ENCOUNTER — Other Ambulatory Visit: Payer: Self-pay | Admitting: Cardiology

## 2018-06-16 NOTE — Progress Notes (Signed)
Cardiology Office Note   Date:  06/17/2018   ID:  Seth Abbott, DOB [birth year], MRN [id number]  PCP:  Seth Dance, DO  Cardiologist:  [name]    Chief Complaint  Patient presents with  . Coronary Artery Disease    dyspnea       History of Present Illness: Seth Abbott is a 85 y.o. male who presents for SOB  He has a hx of ASCADwith cath 12/2014 showing 95% proximal RCA and diffusely calcified proximal to distal LAD up to 70% status post rotational atherectomy with DES to the proximal RCA on 02/09/2015. Repeat cath done for exertional dyspnea and chest pain 01/04/2017 showed 70-80% proximal to mid LAD stenosis with mild progression, patent left circumflex and patent RCA stent with 50-60% mid to distal RCA. Medical therapy was recommended due to the degree of heavy calcification of the LAD which require athero-ablation either with rotational atherectomy or orbital atherectomy followed by a very long stented segment with a crossover of a very large diagonal branch. It was felt that PCI should be reserved for unstable angina or acute coronary syndrome only.He also has a history of dilated aortic root at 41 mm by echo in July 2018, GERD, hypertension,type 2 diabetes mellitus and hyperlipidemia.  Recent visit with [name] he complained of increasing shortness of breath over the past few weeks, mainly with exertion. His son says that he has gained some weight and has become more sedentary and not exercising like he had been. He denies any complaints of allergies. The patient thinks it may be related to his the hot weather. Heis compliant with hismeds and is tolerating meds with no SE. [name] felt it was related to sedentary lifestyle, and deconditioning.  Though he does have significant CAD and could be anginal equivalent.  His imdur was increased to 60 mg .  On last cath " last catheter was recommended that PCI only be done for acute coronary syndrome or recalcitrant  angina."  Plan had been if no improvement he would need lexiscan Myoview.  Last echo 03/11/18 mild aotic stenosis and mild to moderate aortic insuff.    Did have acute anemia.   GI Bleed with hospitalization 04/24/18 and discharged 04/28/18  EGD with duodenal ulcers.  Protonix increased to 40 mg BID.  On plavix alone.  No further problems and no change in dyspnea.  Does not awaken from sleep.  No chest pain. No further blood in his stool.  He saw Dr. Watt Climes the end of Dec. And Hgb was stable. Per the son.  Last one I have is 12.17 and was up to 11.     Troponin neg in ER  Past Medical History:  Diagnosis Date  . Aortic insufficiency    Mild to moderate by echo 02/2018  . Aortic stenosis, mild    by echo 02/2018  . Arthritis    "mostly in my hands" (02/09/2015)  . CAD (coronary artery disease)    a. 12/2014 cath showed 95% prox RCA and diffuse calcified prox to distal LAD disease up to 70% at multiple spots. S/p rotational atherectomy/DES to prox RCA 02/09/15. Repeat cath 01/04/2017 showed 70-80% prox to mid LAD stenosis with mild progression since 2016, patent LCx, patent RCA stent and 50-60% mid to distal RCA with normal LVF and normal LVEDP.  Medical management was recommended unless he ha  . Dilated aortic root ([place])    64mm by echo 12/2016  . GERD (gastroesophageal reflux disease)   .  HTN (hypertension)   . Hyperlipidemia   . Prostatitis   . Shingles   . TIA (transient ischemic attack) early 1990's; 09/2006   [name] 10/26/2010  . Type II diabetes mellitus ([place])     Past Surgical History:  Procedure Laterality Date  . BIOPSY  04/25/2018   Procedure: BIOPSY;  Surgeon: Clarene Essex, MD;  Location: [facility];  Service: Endoscopy;;  . CARDIAC CATHETERIZATION N/A 12/11/2014   Procedure: Left Heart Cath and Coronary Angiography;  Surgeon: Belva Crome, MD;  Location: [facility];  Service: Cardiovascular;  Laterality: N/A;  . CARDIAC CATHETERIZATION N/A 02/09/2015   Procedure: Coronary  Stent Intervention-Rotoblator, temp pacer;  Surgeon: Belva Crome, MD;  Location: [facility];  Service: Cardiovascular;  Laterality: N/A;  . CATARACT EXTRACTION W/ INTRAOCULAR LENS  IMPLANT, BILATERAL Bilateral   . ESOPHAGOGASTRODUODENOSCOPY (EGD) WITH PROPOFOL N/A 04/25/2018   Procedure: ESOPHAGOGASTRODUODENOSCOPY (EGD) WITH PROPOFOL;  Surgeon: Clarene Essex, MD;  Location: [facility];  Service: Endoscopy;  Laterality: N/A;  . EXCISIONAL [place]  . EYE SURGERY    . LEFT HEART CATH AND CORONARY ANGIOGRAPHY N/A 01/04/2017   Procedure: Left Heart Cath and Coronary Angiography;  Surgeon: Belva Crome, MD;  Location: [facility];  Service: Cardiovascular;  Laterality: N/A;  . MEMBRANE PEEL Left 01/11/2015   eye     Current Outpatient Medications  Medication Sig Dispense Refill  . ACCU-CHEK AVIVA PLUS test strip USE TO CHECK BLOOD SUGAR 3 TIMES DAILY  11  . atorvastatin (LIPITOR) 40 MG tablet Take 1 tablet (40 mg total) by mouth every morning. 90 tablet 3  . clopidogrel (PLAVIX) 75 MG tablet TAKE 1 TABLET BY MOUTH EVERY DAY 90 tablet 3  . ferrous sulfate 325 (65 FE) MG EC tablet Take 325 mg by mouth 3 (three) times daily with meals.    [name] FLUoxetine (PROZAC) 20 MG tablet TAKE 1 TABLET BY MOUTH EVERY DAY 90 tablet 0  . insulin lispro (HUMALOG) 100 UNIT/ML cartridge Inject 10-15 Units into the skin 2 (two) times daily. Pt ranges his insulin with his blood sugar readings    . insulin NPH Human (HUMULIN N,NOVOLIN N) 100 UNIT/ML injection Inject 25 Units into the skin 2 (two) times daily before a meal.     . isosorbide mononitrate (IMDUR) 60 MG 24 hr tablet Take 1 tablet (60 mg total) by mouth daily. 90 tablet 3  . metoprolol succinate (TOPROL-XL) 25 MG 24 hr tablet TAKE 1 TABLET BY MOUTH DAILY . TAKE WITH OR IMMEDIATELY FOLLOWING A MEAL 90 tablet 3  . Multiple Vitamins-Minerals (MULTIVITAMIN WITH MINERALS) tablet Take 1 tablet by mouth daily.    . nitroGLYCERIN  (NITROSTAT) 0.4 MG SL tablet Place 1 tablet (0.4 mg total) under the tongue every 5 (five) minutes as needed for chest pain (up to 3 doses). 25 tablet 3  . pantoprazole (PROTONIX) 40 MG tablet TAKE 1 TABLET BY MOUTH 2 TIMES DAILY BEFORE A MEAL 60 tablet 11  . polyethylene glycol (MIRALAX) packet Take 17 g by mouth daily as needed. 14 each 0   No current facility-administered medications for this visit.     Allergies:   Patient has no known allergies.    Social History:  The patient  reports that he has quit smoking. His smoking use included cigarettes. He has never used smokeless tobacco. He reports current alcohol use. He reports that he does not use drugs.   Family History:  The patient's family  history includes CAD in his father; COPD in his brother; Diabetes in his father, mother, sister, and sister; Heart attack in his father.    ROS:  General:no colds or fevers, no weight changes Skin:no rashes or ulcers HEENT:no blurred vision, no congestion CV:see HPI PUL:see HPI GI:no diarrhea constipation or melena, no indigestion since GI bleed GU:no hematuria, no dysuria MS:no joint pain, no claudication Neuro:no syncope, no lightheadedness Endo:+ diabetes, no thyroid disease  Wt Readings from Last 3 Encounters:  06/17/18 185 lb 1.9 oz (84 kg)  05/06/18 191 lb 3.2 oz (86.7 kg)  04/28/18 188 lb 6.4 oz (85.5 kg)     PHYSICAL EXAM: VS:  BP 120/62   Pulse 68   Ht 5\' 5"  (1.651 m)   Wt 185 lb 1.9 oz (84 kg)   SpO2 99%   BMI 30.81 kg/m  , BMI Body mass index is 30.81 kg/m. General:Pleasant affect, NAD Skin:Warm and dry, brisk capillary refill HEENT:normocephalic, sclera clear, mucus membranes moist Neck:supple, no JVD, no bruits  Heart:S1S2 RRR with 2/6 systolic murmur, no gallup, rub or click Lungs:clear without rales, rhonchi, or wheezes SFK:CLEX, non tender, + BS, do not palpate liver spleen or masses Ext:no lower ext edema, 2+ pedal pulses, 2+ radial pulses Neuro:alert and  oriented X 3, MAE, follows commands, + facial symmetry    EKG:  EKG is NOT ordered today. The ekg in the ER SR with RBBB and LPFB no changes though on some old EKGs not RBBB.    Recent Labs: 01/29/2018: TSH 3.98 04/19/2018: NT-Pro BNP 196 04/24/2018: ALT 18; B Natriuretic Peptide 32.4 04/28/2018: BUN 12; Creatinine, Ser 0.95; Potassium 3.5; Sodium 134 05/28/2018: Hemoglobin 11.1; Platelets 203    Lipid Panel    Component Value Date/Time   CHOL 109 04/19/2018 0959   TRIG 48 04/19/2018 0959   HDL 45 04/19/2018 0959   CHOLHDL 2.4 04/19/2018 0959   CHOLHDL 2.4 03/17/2016 0847   VLDL 14 03/17/2016 0847   LDLCALC 54 04/19/2018 0959       Other studies Reviewed: Additional studies/ records that were reviewed today include: . Echo 03/11/18 Study Conclusions  - Left ventricle: The cavity size was normal. There was moderate concentric hypertrophy. Systolic function was normal. The estimated ejection fraction was in the range of 60% to 65%. Although no diagnostic regional wall motion abnormality was identified, this possibility cannot be completely excluded on the basis of this study. Doppler parameters are consistent with abnormal left ventricular relaxation (grade 1 diastolic dysfunction). - Aortic valve: Trileaflet; moderately thickened, moderately calcified leaflets. Valve mobility was mildly restricted. There was very mild stenosis. There was mild to moderate regurgitation. - Mitral valve: There was trivial regurgitation. - Left atrium: The atrium was moderately dilated. - Atrial septum: No defect or patent foramen ovale was identified. - Tricuspid valve: There was trivial regurgitation. - Pulmonic valve: There was trivial regurgitation.  Impressions:  - Normal LV EF without obvious wall motion abnormalities. Wall motion not ideal in several views, but patient declined echo contrast. Grade 1 diastolic dysfunction. Thickened aortic valve with  very mild stenosis and mild-moderate regurgitation.  Cardiac cath 01/04/17    Severe calcific coronary artery disease.  Proximal to mid significant LAD obstructive disease in the 70-80% range that extends across a large diagonal. When compared to the prior angiogram from 2016 there has been mild progression in the proximal segment.  Widely patent circumflex coronary artery.  Widely patent right coronary stent deployment in 2016. Eccentric 50-60% mid to distal  RCA.  Normal left ventricular function and end diastolic pressures. LVEDP 9 mmHg pre-ventriculography.  RECOMMENDATIONS:   The left anterior descending is significantly obstructed and is heavily calcified. Intervention would be complex including atheroblation with either rotational atherectomy or orbital atherectomy. This would then need to be followed by extremely long segment of stent that will crossover a very large diagonal branch increase in the risk of acute ischemia. Given normal LV function and hemodynamics with mild progression in disease over 2 years, I would recommend up titrating medical therapy, reserving PCI for unstable angina/ACS if that ever develops.  Will reinstitute the patient's DO NOT RESUSCITATE request which I asked to temporarily rescind to allow cath and appropriate management if complications.     ASSESSMENT AND PLAN:  1.  DOE stable, and recent acute GI bleed, with no chest pain and no change in dyspnea.  Trop neg and no acute EKG changes.  We discussed if increased dyspnea to call or if wakes from sleep.   He had his son are in agreement.  Follow up with [name] in 3-4 months.   2.  Significant CAD will leave imdur at 60 mg, on plavix and ASA though currently ASA has been stopped.  Will discuss with [name].    3.  RBBB chronic though intermittent.    4.  dilated aortic root, on echo 02/2018 it was normal in size.  5.  Mild AS on current Echo with AR.    6.  Recent acute GI bleed.  Now  Hgb stable.  + duodenal ulcer.  Dr. Watt Climes followed.     Current medicines are reviewed with the patient today.  The patient Has no concerns regarding medicines.  The following changes have been made:  See above Labs/ tests ordered today include:see above  Disposition:   FU:  see above  Signed, Cecilie Kicks, NP  06/17/2018 9:49 AM    [address], [place], [place], [place] Phone: [phone]; Fax: [phone]

## 2018-06-17 ENCOUNTER — Encounter: Payer: Self-pay | Admitting: Cardiology

## 2018-06-17 ENCOUNTER — Ambulatory Visit (INDEPENDENT_AMBULATORY_CARE_PROVIDER_SITE_OTHER): Payer: Medicare Other | Admitting: Cardiology

## 2018-06-17 VITALS — BP 120/62 | HR 68 | Ht 65.0 in | Wt 185.1 lb

## 2018-06-17 DIAGNOSIS — I77810 Thoracic aortic ectasia: Secondary | ICD-10-CM | POA: Diagnosis not present

## 2018-06-17 DIAGNOSIS — K264 Chronic or unspecified duodenal ulcer with hemorrhage: Secondary | ICD-10-CM | POA: Diagnosis not present

## 2018-06-17 DIAGNOSIS — I10 Essential (primary) hypertension: Secondary | ICD-10-CM | POA: Diagnosis not present

## 2018-06-17 DIAGNOSIS — I351 Nonrheumatic aortic (valve) insufficiency: Secondary | ICD-10-CM | POA: Diagnosis not present

## 2018-06-17 DIAGNOSIS — I2510 Atherosclerotic heart disease of native coronary artery without angina pectoris: Secondary | ICD-10-CM

## 2018-06-17 DIAGNOSIS — R0602 Shortness of breath: Secondary | ICD-10-CM | POA: Diagnosis not present

## 2018-06-17 DIAGNOSIS — E1159 Type 2 diabetes mellitus with other circulatory complications: Secondary | ICD-10-CM

## 2018-06-17 NOTE — Patient Instructions (Addendum)
Medication Instructions:  Your physician recommends that you continue on your current medications as directed. Please refer to the Current Medication list given to you today.  If you need a refill on your cardiac medications before your next appointment, please call your pharmacy.   Lab work: None ordered  If you have labs (blood work) drawn today and your tests are completely normal, you will receive your results only by: [name] MyChart Message (if you have MyChart) OR . A paper copy in the mail If you have any lab test that is abnormal or we need to change your treatment, we will call you to review the results.  Testing/Procedures: None ordered  Follow-Up: At [facility], you and your health needs are our priority.  As part of our continuing mission to provide you with exceptional heart care, we have created designated Provider Care Teams.  These Care Teams include your primary Cardiologist (physician) and Advanced Practice Providers (APPs -  Physician Assistants and Nurse Practitioners) who all work together to provide you with the care you need, when you need it. You will need a follow up appointment in 3-4 MONTHS WITH [name]  Any Other Special Instructions Will Be Listed Below (If Applicable).

## 2018-06-27 ENCOUNTER — Ambulatory Visit: Payer: Medicare Other | Admitting: Family Medicine

## 2018-07-31 DIAGNOSIS — Z794 Long term (current) use of insulin: Secondary | ICD-10-CM | POA: Diagnosis not present

## 2018-07-31 DIAGNOSIS — E119 Type 2 diabetes mellitus without complications: Secondary | ICD-10-CM | POA: Diagnosis not present

## 2018-07-31 DIAGNOSIS — I10 Essential (primary) hypertension: Secondary | ICD-10-CM | POA: Diagnosis not present

## 2018-09-05 ENCOUNTER — Ambulatory Visit: Payer: Medicare Other | Admitting: Family Medicine

## 2018-12-21 ENCOUNTER — Other Ambulatory Visit: Payer: Self-pay | Admitting: Cardiology

## 2019-03-17 ENCOUNTER — Other Ambulatory Visit: Payer: Self-pay | Admitting: Cardiology

## 2019-03-21 ENCOUNTER — Other Ambulatory Visit: Payer: Self-pay | Admitting: Cardiology

## 2019-04-03 DIAGNOSIS — Z23 Encounter for immunization: Secondary | ICD-10-CM | POA: Diagnosis not present

## 2019-05-29 ENCOUNTER — Other Ambulatory Visit: Payer: Self-pay | Admitting: Cardiology

## 2019-05-29 DIAGNOSIS — R0602 Shortness of breath: Secondary | ICD-10-CM

## 2019-06-13 ENCOUNTER — Other Ambulatory Visit: Payer: Self-pay | Admitting: Cardiology

## 2019-06-16 ENCOUNTER — Other Ambulatory Visit: Payer: Self-pay | Admitting: Cardiology

## 2019-07-10 ENCOUNTER — Other Ambulatory Visit: Payer: Self-pay | Admitting: Cardiology

## 2019-07-21 ENCOUNTER — Other Ambulatory Visit: Payer: Self-pay | Admitting: Cardiology

## 2019-07-25 ENCOUNTER — Other Ambulatory Visit: Payer: Self-pay | Admitting: Cardiology

## 2019-08-15 ENCOUNTER — Ambulatory Visit: Payer: Medicare Other

## 2019-08-20 ENCOUNTER — Telehealth: Payer: Self-pay

## 2019-08-20 NOTE — Telephone Encounter (Signed)
Pt calls c/o left LLB pain.  Pt denies fever, chills, dysuria, hematuria or urinary frequency.  Pt further states that he has tried Ibuprofen, Tylenol, and a rub similar to [employer].  Advised pt that since Dr. Raliegh Scarlet does not have any availability for approximately 2 weeks, pt should proceed to UC for evaluation and treatment.  Pt expressed understanding.  Charyl Bigger, CMA

## 2019-08-21 ENCOUNTER — Other Ambulatory Visit: Payer: Self-pay | Admitting: Cardiology

## 2019-08-21 DIAGNOSIS — R0602 Shortness of breath: Secondary | ICD-10-CM

## 2019-09-09 ENCOUNTER — Other Ambulatory Visit: Payer: Self-pay | Admitting: Cardiology

## 2019-09-12 DIAGNOSIS — Z23 Encounter for immunization: Secondary | ICD-10-CM | POA: Diagnosis not present

## 2019-09-13 ENCOUNTER — Other Ambulatory Visit: Payer: Self-pay | Admitting: Cardiology

## 2019-09-14 ENCOUNTER — Other Ambulatory Visit: Payer: Self-pay | Admitting: Cardiology

## 2019-09-14 DIAGNOSIS — R0602 Shortness of breath: Secondary | ICD-10-CM

## 2019-09-29 ENCOUNTER — Other Ambulatory Visit: Payer: Self-pay | Admitting: Cardiology

## 2019-09-29 DIAGNOSIS — R0602 Shortness of breath: Secondary | ICD-10-CM

## 2019-09-30 ENCOUNTER — Other Ambulatory Visit: Payer: Self-pay | Admitting: Cardiology

## 2019-09-30 MED ORDER — METOPROLOL SUCCINATE ER 25 MG PO TB24: ORAL_TABLET | ORAL | 0 refills | Status: DC

## 2019-09-30 NOTE — Telephone Encounter (Signed)
  *  STAT* If patient is at the pharmacy, call can be transferred to refill team.   1. Which medications need to be refilled? (please list name of each medication and dose if known) metoprolol succinate (TOPROL-XL) 25 MG 24 hr tablet  2. Which pharmacy/location (including street and city if local pharmacy) is medication to be sent to? [id number] - [place], [place] - [address].  3. Do they need a 30 day or 90 day supply? 30 days  Pt is about to ran out, he has appt with [name] on 10/21/19

## 2019-09-30 NOTE — Addendum Note (Signed)
Addended by: Derl Barrow on: 09/30/2019 09:28 AM   Modules accepted: Orders

## 2019-09-30 NOTE — Telephone Encounter (Signed)
Pt's medication was sent to pt's pharmacy as requested. Confirmation received.  °

## 2019-10-03 ENCOUNTER — Other Ambulatory Visit: Payer: Self-pay | Admitting: Cardiology

## 2019-10-10 DIAGNOSIS — Z23 Encounter for immunization: Secondary | ICD-10-CM | POA: Diagnosis not present

## 2019-10-21 ENCOUNTER — Encounter: Payer: Self-pay | Admitting: Cardiology

## 2019-10-21 ENCOUNTER — Ambulatory Visit (INDEPENDENT_AMBULATORY_CARE_PROVIDER_SITE_OTHER): Payer: Medicare Other | Admitting: Cardiology

## 2019-10-21 ENCOUNTER — Other Ambulatory Visit: Payer: Self-pay

## 2019-10-21 VITALS — BP 144/78 | HR 84 | Ht 65.0 in | Wt 183.1 lb

## 2019-10-21 DIAGNOSIS — R0609 Other forms of dyspnea: Secondary | ICD-10-CM

## 2019-10-21 DIAGNOSIS — I2510 Atherosclerotic heart disease of native coronary artery without angina pectoris: Secondary | ICD-10-CM | POA: Diagnosis not present

## 2019-10-21 DIAGNOSIS — I4510 Unspecified right bundle-branch block: Secondary | ICD-10-CM

## 2019-10-21 DIAGNOSIS — I77810 Thoracic aortic ectasia: Secondary | ICD-10-CM | POA: Diagnosis not present

## 2019-10-21 DIAGNOSIS — E785 Hyperlipidemia, unspecified: Secondary | ICD-10-CM | POA: Diagnosis not present

## 2019-10-21 DIAGNOSIS — I350 Nonrheumatic aortic (valve) stenosis: Secondary | ICD-10-CM | POA: Diagnosis not present

## 2019-10-21 DIAGNOSIS — R0600 Dyspnea, unspecified: Secondary | ICD-10-CM

## 2019-10-21 DIAGNOSIS — I351 Nonrheumatic aortic (valve) insufficiency: Secondary | ICD-10-CM

## 2019-10-21 MED ORDER — ISOSORBIDE MONONITRATE ER 60 MG PO TB24: 60.0000 mg | ORAL_TABLET | Freq: Every day | ORAL | 3 refills | Status: DC

## 2019-10-21 NOTE — Patient Instructions (Signed)
Medication Instructions:  Your physician has recommended you make the following change in your medication:  1) INCREASE Imdur 60mg  daily  *If you need a refill on your cardiac medications before your next appointment, please call your pharmacy*   Lab Work: BMET, FLP, TSH, CBC, CMET, and BNP to be done at your primary care provider's office. If you have labs (blood work) drawn today and your tests are completely normal, you will receive your results only by: [name] MyChart Message (if you have MyChart) OR . A paper copy in the mail If you have any lab test that is abnormal or we need to change your treatment, we will call you to review the results.   Testing/Procedures: Your physician has requested that you have an echocardiogram. Echocardiography is a painless test that uses sound waves to create images of your heart. It provides your doctor with information about the size and shape of your heart and how well your heart's chambers and valves are working. This procedure takes approximately one hour. There are no restrictions for this procedure.  Follow-Up: At [facility], you and your health needs are our priority.  As part of our continuing mission to provide you with exceptional heart care, we have created designated Provider Care Teams.  These Care Teams include your primary Cardiologist (physician) and Advanced Practice Providers (APPs -  Physician Assistants and Nurse Practitioners) who all work together to provide you with the care you need, when you need it.  We recommend signing up for the patient portal called "MyChart".  Sign up information is provided on this After Visit Summary.  MyChart is used to connect with patients for Virtual Visits (Telemedicine).  Patients are able to view lab/test results, encounter notes, upcoming appointments, etc.  Non-urgent messages can be sent to your provider as well.   To learn more about what you can do with MyChart, go to [email or web address].     Your next appointment:   4 week(s)  The format for your next appointment:   In Person  Provider:   You may see Fransico Him, MD or one of the following Advanced Practice Providers on your designated Care Team:    Melina Copa, PA-C  Ermalinda Barrios, PA-C

## 2019-10-21 NOTE — Progress Notes (Signed)
Cardiology Office Note:    Date:  10/22/2019   ID:  Seth Abbott, DOB [birth year], MRN [id number]  PCP:  Delrae Rend, MD  Cardiologist:  Fransico Him, MD    Referring MD: [name], DO   Chief Complaint  Patient presents with  . Coronary Artery Disease  . Hypertension  . Hyperlipidemia  . Shortness of Breath    History of Present Illness:    Seth Abbott is a 86 y.o. male with a hx of [place] cath 12/2014 showing 95% proximal RCA and diffusely calcified proximal to distal LAD up to 70% status post rotational atherectomy with DES to the proximal RCA on 02/09/2015. Repeat cath done for exertional dyspnea and chest pain 01/04/2017 showed 70-80% proximal to mid LAD stenosis with mild progression, patent left circumflex and patent RCA stent with 50-60% mid to distal RCA. Medical therapy was recommended due to the degree of heavy calcification of the LAD which require athero-ablation either with rotational atherectomy or orbital atherectomy followed by a very long stented segment with a crossover of a very large diagonal branch. It was felt that PCI should be reserved for unstable angina or acute coronary syndrome only.He also has a history of dilated aortic root at 41 mm by echo in July 2018, GERD, hypertension,type 2 diabetes mellitus and hyperlipidemia.  Last echo 03/11/18 mild aotic stenosis and mild to moderate aortic insuff.Did have acute anemia.   GI Bleed with hospitalization 04/24/18 and discharged 04/28/18  EGD with duodenal ulcers.  Protonix increased to 40 mg BID.  On plavix alone.    He is here today for followup and is doing well.  He continues to have DOE which he thinks has gotten worse in the past year.  He had not seen an MD in over 18 months due to [place] 19.  He denies any chest pain or pressure, PND, orthopnea, LE edema, dizziness (except when BS is low), palpitations or syncope. He is compliant with his meds and is tolerating meds with no SE.    Past  Medical History:  Diagnosis Date  . Aortic insufficiency    Mild to moderate by echo 02/2018  . Aortic stenosis, mild    by echo 02/2018  . Arthritis    "mostly in my hands" (02/09/2015)  . CAD (coronary artery disease)    a. 12/2014 cath showed 95% prox RCA and diffuse calcified prox to distal LAD disease up to 70% at multiple spots. S/p rotational atherectomy/DES to prox RCA 02/09/15. Repeat cath 01/04/2017 showed 70-80% prox to mid LAD stenosis with mild progression since 2016, patent LCx, patent RCA stent and 50-60% mid to distal RCA with normal LVF and normal LVEDP.  Medical management was recommended unless he ha  . Dilated aortic root ([place])    34mm by echo 12/2016  . GERD (gastroesophageal reflux disease)   . HTN (hypertension)   . Hyperlipidemia   . Prostatitis   . Shingles   . TIA (transient ischemic attack) early 1990's; 09/2006   [name] 10/26/2010  . Type II diabetes mellitus ([place])     Past Surgical History:  Procedure Laterality Date  . BIOPSY  04/25/2018   Procedure: BIOPSY;  Surgeon: Clarene Essex, MD;  Location: [facility];  Service: Endoscopy;;  . CARDIAC CATHETERIZATION N/A 12/11/2014   Procedure: Left Heart Cath and Coronary Angiography;  Surgeon: Belva Crome, MD;  Location: [facility];  Service: Cardiovascular;  Laterality: N/A;  . CARDIAC CATHETERIZATION N/A 02/09/2015   Procedure:  Coronary Stent Intervention-Rotoblator, temp pacer;  Surgeon: Belva Crome, MD;  Location: [facility];  Service: Cardiovascular;  Laterality: N/A;  . CATARACT EXTRACTION W/ INTRAOCULAR LENS  IMPLANT, BILATERAL Bilateral   . ESOPHAGOGASTRODUODENOSCOPY (EGD) WITH PROPOFOL N/A 04/25/2018   Procedure: ESOPHAGOGASTRODUODENOSCOPY (EGD) WITH PROPOFOL;  Surgeon: Clarene Essex, MD;  Location: [facility];  Service: Endoscopy;  Laterality: N/A;  . EXCISIONAL [place]  . EYE SURGERY    . LEFT HEART CATH AND CORONARY ANGIOGRAPHY N/A 01/04/2017   Procedure: Left Heart Cath and  Coronary Angiography;  Surgeon: Belva Crome, MD;  Location: [facility];  Service: Cardiovascular;  Laterality: N/A;  . MEMBRANE PEEL Left 01/11/2015   eye    Current Medications: Current Meds  Medication Sig  . ACCU-CHEK AVIVA PLUS test strip USE TO CHECK BLOOD SUGAR 3 TIMES DAILY  . atorvastatin (LIPITOR) 40 MG tablet Take 1 tablet (40 mg total) by mouth daily at 6 PM. Please schedule follow up appt for for further refills. 2nd attempt [phone]  . clopidogrel (PLAVIX) 75 MG tablet Take 1 tablet (75 mg total) by mouth daily. Please keep scheduled appt for future refills. Thanks  . ferrous sulfate 325 (65 FE) MG EC tablet Take 325 mg by mouth 3 (three) times daily with meals.  [name] FLUoxetine (PROZAC) 20 MG tablet TAKE 1 TABLET BY MOUTH EVERY DAY  . insulin lispro (HUMALOG) 100 UNIT/ML cartridge Inject 10-15 Units into the skin 2 (two) times daily. Pt ranges his insulin with his blood sugar readings  . insulin NPH Human (HUMULIN N,NOVOLIN N) 100 UNIT/ML injection Inject 25 Units into the skin 2 (two) times daily before a meal.   . metoprolol succinate (TOPROL-XL) 25 MG 24 hr tablet TAKE 1 TABLET DAILY. IMMEDIATELY OR FOLLOWING A MEAL. Please keep upcoming in May with [name] for future refills. Thank you  . Multiple Vitamins-Minerals (MULTIVITAMIN WITH MINERALS) tablet Take 1 tablet by mouth daily.  . nitroGLYCERIN (NITROSTAT) 0.4 MG SL tablet Place 1 tablet (0.4 mg total) under the tongue every 5 (five) minutes as needed for chest pain (up to 3 doses).  . pantoprazole (PROTONIX) 40 MG tablet TAKE 1 TABLET BY MOUTH 2 TIMES DAILY BEFORE A MEAL  . polyethylene glycol (MIRALAX) packet Take 17 g by mouth daily as needed.  . [DISCONTINUED] isosorbide Abbott (IMDUR) 60 MG 24 hr tablet Take 0.5 tablets (30 mg total) by mouth daily. Please keep upcoming appt in May with [name] before anymore refills. Thank you     Allergies:   Patient has no known allergies.   Social History    Socioeconomic History  . Marital status: Widowed    Spouse name: Not on file  . Number of children: Not on file  . Years of education: Not on file  . Highest education level: Not on file  Occupational History  . Occupation: retired  Tobacco Use  . Smoking status: Former Smoker    Types: Cigarettes  . Smokeless tobacco: Never Used  . Tobacco comment: "haven't smoked since I was 15" tried it once  Substance and Sexual Activity  . Alcohol use: Yes    Alcohol/week: 0.0 standard drinks    Comment: 02/09/2015 "might have a sip of wne a dozen times/yr"  . Drug use: No  . Sexual activity: Not Currently  Other Topics Concern  . Not on file  Social History Narrative  . Not on file   Social Determinants of Health   Financial  Resource Strain:   . Difficulty of Paying Living Expenses:   Food Insecurity:   . Worried About Charity fundraiser in the Last Year:   . Arboriculturist in the Last Year:   Transportation Needs:   . Film/video editor (Medical):   [name] Lack of Transportation (Non-Medical):   Physical Activity:   . Days of Exercise per Week:   . Minutes of Exercise per Session:   Stress:   . Feeling of Stress :   Social Connections:   . Frequency of Communication with Friends and Family:   . Frequency of Social Gatherings with Friends and Family:   . Attends Religious Services:   . Active Member of Clubs or Organizations:   . Attends Archivist Meetings:   [name] Marital Status:      Family History: The patient's family history includes CAD in his father; COPD in his brother; Diabetes in his father, mother, sister, and sister; Heart attack in his father.  ROS:   Please see the history of present illness.    ROS  All other systems reviewed and negative.   EKGs/Labs/Other Studies Reviewed:    The following studies were reviewed today: Outside labs from PCP  EKG:  EKG is  ordered today.  The ekg ordered today demonstrates NSR at 84bpm with RBBB  Recent  Labs: No results found for requested labs within last 8760 hours.   Recent Lipid Panel    Component Value Date/Time   CHOL 109 04/19/2018 0959   TRIG 48 04/19/2018 0959   HDL 45 04/19/2018 0959   CHOLHDL 2.4 04/19/2018 0959   CHOLHDL 2.4 03/17/2016 0847   VLDL 14 03/17/2016 0847   LDLCALC 54 04/19/2018 0959    Physical Exam:    VS:  BP (!) 144/78   Pulse 84   Ht 5\' 5"  (1.651 m)   Wt 183 lb 1.9 oz (83.1 kg)   BMI 30.47 kg/m     Wt Readings from Last 3 Encounters:  10/21/19 183 lb 1.9 oz (83.1 kg)  06/17/18 185 lb 1.9 oz (84 kg)  05/06/18 191 lb 3.2 oz (86.7 kg)     GEN:  Well nourished, well developed in no acute distress HEENT: Normal NECK: No JVD; No carotid bruits LYMPHATICS: No lymphadenopathy CARDIAC: RRR, no murmurs, rubs, gallops RESPIRATORY:  Clear to auscultation without rales, wheezing or rhonchi  ABDOMEN: Soft, non-tender, non-distended MUSCULOSKELETAL:  No edema; No deformity  SKIN: Warm and dry NEUROLOGIC:  Alert and oriented x 3 PSYCHIATRIC:  Normal affect   ASSESSMENT:    1. DOE (dyspnea on exertion)   2. Coronary artery disease involving native coronary artery of native heart without angina pectoris   3. RBBB   4. Dilated aortic root ([place])   5. Aortic stenosis, mild   6. Hyperlipidemia LDL goal <70   7. Aortic valve insufficiency, etiology of cardiac valve disease unspecified    PLAN:    In order of problems listed above:  1.  DOE  -he thinks that this has gotten worse over the past year -lungs are clear on exam -I will check a TSH, BNP, CMET, FLP and 2D echo -increase Imdur to 60mg  daily in case this is an anginal equivalent and followup with me in 4 weeks  2.  ASCAD -cath 12/2014 showing 95% proximal RCA and diffusely calcified proximal to distal LAD up to 70% status post rotational atherectomy with DES to the proximal RCA on 02/09/2015.  -Repeat cath  done for exertional dyspnea and chest pain 01/04/2017 showed 70-80% proximal to mid LAD  stenosis with mild progression, patent left circumflex and patent RCA stent with 50-60% mid to distal RCA.  -Medical therapy was recommended due to the degree of heavy calcification of the LAD which require athero-ablation either with rotational atherectomy or orbital atherectomy followed by a very long stented segment with a crossover of a very large diagonal branch.  -It was felt that PCI should be reserved for unstable angina or acute coronary syndrome only. -continue Toprol XL 25mg  daily, plavix 75mg  daily and statin -in case his SOB is an anginal equivalent I will increase Imdur to 60mg  daily  3.  RBBB  -chronic though intermittent.    4.  Dilated aortic root -on echo 02/2018 it was normal in size.  5.  Mild AS  -echo 2019 with mild AS and mild to moderate AI -repeat 2D echo to make sure this is stable  6.  Hx of GI bleed   -EGD + duodenal ulcer.   -followed by Dr. Watt Climes with GI  7.  HLD -LDL goal < 70 -continue atorvastatin 40mg  daily -check FLP and ALT   Medication Adjustments/Labs and Tests Ordered: Current medicines are reviewed at length with the patient today.  Concerns regarding medicines are outlined above.  Orders Placed This Encounter  Procedures  . Comprehensive metabolic panel  . Lipid panel  . CBC  . TSH  . Pro b natriuretic peptide (BNP)  . EKG 12-Lead  . ECHOCARDIOGRAM COMPLETE   Meds ordered this encounter  Medications  . isosorbide Abbott (IMDUR) 60 MG 24 hr tablet    Sig: Take 1 tablet (60 mg total) by mouth daily.    Dispense:  90 tablet    Refill:  3    Signed, Fransico Him, MD  10/22/2019 7:38 AM    [facility]

## 2019-10-24 ENCOUNTER — Other Ambulatory Visit: Payer: Self-pay | Admitting: Cardiology

## 2019-10-27 ENCOUNTER — Other Ambulatory Visit: Payer: Self-pay

## 2019-10-27 ENCOUNTER — Other Ambulatory Visit: Payer: Medicare Other

## 2019-10-27 DIAGNOSIS — E785 Hyperlipidemia, unspecified: Secondary | ICD-10-CM

## 2019-10-27 DIAGNOSIS — R0600 Dyspnea, unspecified: Secondary | ICD-10-CM | POA: Diagnosis not present

## 2019-10-27 DIAGNOSIS — R0609 Other forms of dyspnea: Secondary | ICD-10-CM

## 2019-10-28 ENCOUNTER — Other Ambulatory Visit: Payer: Self-pay | Admitting: Cardiology

## 2019-10-28 LAB — CBC WITH DIFFERENTIAL/PLATELET
Basophils Absolute: 0.1 10*3/uL (ref 0.0–0.2)
Basos: 1 %
EOS (ABSOLUTE): 0.2 10*3/uL (ref 0.0–0.4)
Eos: 4 %
Hematocrit: 41.2 % (ref 37.5–51.0)
Hemoglobin: 13.9 g/dL (ref 13.0–17.7)
Immature Grans (Abs): 0 10*3/uL (ref 0.0–0.1)
Immature Granulocytes: 0 %
Lymphocytes Absolute: 1 10*3/uL (ref 0.7–3.1)
Lymphs: 16 %
MCH: 31 pg (ref 26.6–33.0)
MCHC: 33.7 g/dL (ref 31.5–35.7)
MCV: 92 fL (ref 79–97)
Monocytes Absolute: 0.5 10*3/uL (ref 0.1–0.9)
Monocytes: 8 %
Neutrophils Absolute: 4.3 10*3/uL (ref 1.4–7.0)
Neutrophils: 71 %
Platelets: 186 10*3/uL (ref 150–450)
RBC: 4.49 x10E6/uL (ref 4.14–5.80)
RDW: 13 % (ref 11.6–15.4)
WBC: 6 10*3/uL (ref 3.4–10.8)

## 2019-10-28 LAB — COMPREHENSIVE METABOLIC PANEL
ALT: 17 IU/L (ref 0–44)
AST: 20 IU/L (ref 0–40)
Albumin/Globulin Ratio: 1.6 (ref 1.2–2.2)
Albumin: 4.1 g/dL (ref 3.6–4.6)
Alkaline Phosphatase: 98 IU/L (ref 48–121)
BUN/Creatinine Ratio: 14 (ref 10–24)
BUN: 14 mg/dL (ref 8–27)
Bilirubin Total: 0.5 mg/dL (ref 0.0–1.2)
CO2: 23 mmol/L (ref 20–29)
Calcium: 9.2 mg/dL (ref 8.6–10.2)
Chloride: 100 mmol/L (ref 96–106)
Creatinine, Ser: 1.01 mg/dL (ref 0.76–1.27)
GFR calc Af Amer: 76 mL/min/{1.73_m2} (ref >59)
GFR calc non Af Amer: 66 mL/min/{1.73_m2} (ref >59)
Globulin, Total: 2.6 g/dL (ref 1.5–4.5)
Glucose: 167 mg/dL — ABNORMAL HIGH (ref 65–99)
Potassium: 4.7 mmol/L (ref 3.5–5.2)
Sodium: 137 mmol/L (ref 134–144)
Total Protein: 6.7 g/dL (ref 6.0–8.5)

## 2019-10-28 LAB — LIPID PANEL
Chol/HDL Ratio: 2.3 ratio (ref 0.0–5.0)
Cholesterol, Total: 128 mg/dL (ref 100–199)
HDL: 55 mg/dL (ref >39)
LDL Chol Calc (NIH): 59 mg/dL (ref 0–99)
Triglycerides: 65 mg/dL (ref 0–149)
VLDL Cholesterol Cal: 14 mg/dL (ref 5–40)

## 2019-10-28 LAB — TSH: TSH: 3.77 u[IU]/mL (ref 0.450–4.500)

## 2019-10-28 LAB — PRO B NATRIURETIC PEPTIDE: NT-Pro BNP: 171 pg/mL (ref 0–486)

## 2019-11-11 NOTE — Progress Notes (Signed)
Cardiology Office Note    Date:  11/25/2019   ID:  Seth Abbott, DOB [birth year], MRN [id number]  PCP:  Delrae Rend, MD  Cardiologist: Fransico Him, MD EPS: None  No chief complaint on file.   History of Present Illness:  Seth Abbott is a 86 y.o. male with a hx of ASCAD with cath 12/2014 showing 95% proximal RCA and diffusely calcified proximal to distal LAD up to 70% status post rotational atherectomy with DES to the proximal RCA on 02/09/2015.  Repeat cath done for exertional dyspnea and chest pain 01/04/2017 showed 70-80% proximal to mid LAD stenosis with mild progression, patent left circumflex and patent RCA stent with 50-60% mid to distal RCA.  Medical therapy was recommended due to the degree of heavy calcification of the LAD which require athero-ablation either with rotational atherectomy or orbital atherectomy followed by a very long stented segment with a crossover of a very large diagonal branch.  It was felt that PCI should be  reserved for unstable angina or acute coronary syndrome only. He also has a history of dilated aortic root at 41 mm by echo in July 2018, GERD, hypertension, type 2 diabetes mellitus and hyperlipidemia.   Last echo 03/11/18 mild aotic stenosis and mild to moderate aortic insuff.  Did have acute anemia.   GI Bleed with hospitalization 04/24/18 and discharged 04/28/18  EGD with duodenal ulcers.  Protonix increased to 40 mg BID.  On plavix alone.    Last saw [name] 10/21/19 with worsening DOE. Imdur increased.Echo ordered.labs 10/27/19 LDL 59, Crt 1.01, BNP and TSH normal. Echo 11/13/19 normal LVEF with grade 1 DD and mild aortic stenosis.  Patient comes in with his son. Gets short of breath walking 300 yards. Doesn't do much exercise. His son says the main problem is mobility and and his knees. Imdur hasn't changed symptoms.     Past Medical History:  Diagnosis Date  . Aortic insufficiency    Mild to moderate by echo 02/2018  . Aortic stenosis, mild      by echo 02/2018  . Arthritis    "mostly in my hands" (02/09/2015)  . CAD (coronary artery disease)    a. 12/2014 cath showed 95% prox RCA and diffuse calcified prox to distal LAD disease up to 70% at multiple spots. S/p rotational atherectomy/DES to prox RCA 02/09/15. Repeat cath 01/04/2017 showed 70-80% prox to mid LAD stenosis with mild progression since 2016, patent LCx, patent RCA stent and 50-60% mid to distal RCA with normal LVF and normal LVEDP.  Medical management was recommended unless he ha  . Dilated aortic root ([place])    82mm by echo 12/2016  . GERD (gastroesophageal reflux disease)   . HTN (hypertension)   . Hyperlipidemia   . Prostatitis   . Shingles   . TIA (transient ischemic attack) early 1990's; 09/2006   [name] 10/26/2010  . Type II diabetes mellitus ([place])     Past Surgical History:  Procedure Laterality Date  . BIOPSY  04/25/2018   Procedure: BIOPSY;  Surgeon: Clarene Essex, MD;  Location: [facility];  Service: Endoscopy;;  . CARDIAC CATHETERIZATION N/A 12/11/2014   Procedure: Left Heart Cath and Coronary Angiography;  Surgeon: Belva Crome, MD;  Location: [facility];  Service: Cardiovascular;  Laterality: N/A;  . CARDIAC CATHETERIZATION N/A 02/09/2015   Procedure: Coronary Stent Intervention-Rotoblator, temp pacer;  Surgeon: Belva Crome, MD;  Location: [facility];  Service: Cardiovascular;  Laterality: N/A;  .  CATARACT EXTRACTION W/ INTRAOCULAR LENS  IMPLANT, BILATERAL Bilateral   . ESOPHAGOGASTRODUODENOSCOPY (EGD) WITH PROPOFOL N/A 04/25/2018   Procedure: ESOPHAGOGASTRODUODENOSCOPY (EGD) WITH PROPOFOL;  Surgeon: Clarene Essex, MD;  Location: [facility];  Service: Endoscopy;  Laterality: N/A;  . EXCISIONAL [place]  . EYE SURGERY    . LEFT HEART CATH AND CORONARY ANGIOGRAPHY N/A 01/04/2017   Procedure: Left Heart Cath and Coronary Angiography;  Surgeon: Belva Crome, MD;  Location: [facility];  Service: Cardiovascular;  Laterality:  N/A;  . MEMBRANE PEEL Left 01/11/2015   eye    Current Medications: Current Meds  Medication Sig  . ACCU-CHEK AVIVA PLUS test strip USE TO CHECK BLOOD SUGAR 3 TIMES DAILY  . atorvastatin (LIPITOR) 40 MG tablet Take 1 tablet (40 mg total) by mouth daily at 6 PM.  . clopidogrel (PLAVIX) 75 MG tablet Take 1 tablet (75 mg total) by mouth daily. Please keep scheduled appt for future refills. Thanks  . ferrous sulfate 325 (65 FE) MG EC tablet Take 325 mg by mouth 3 (three) times daily with meals.  [name] FLUoxetine (PROZAC) 20 MG tablet TAKE 1 TABLET BY MOUTH EVERY DAY  . insulin lispro (HUMALOG) 100 UNIT/ML cartridge Inject 10-15 Units into the skin 2 (two) times daily. Pt ranges his insulin with his blood sugar readings  . insulin NPH Human (HUMULIN N,NOVOLIN N) 100 UNIT/ML injection Inject 25 Units into the skin 2 (two) times daily before a meal.   . metoprolol succinate (TOPROL-XL) 25 MG 24 hr tablet TAKE 1 TABLET DAILY. IMMEDIATELY OR FOLLOWING A MEAL. PLEASE KEEP APPT IN MAY WITH [name] FOR FUTURE REFILLS.  . Multiple Vitamins-Minerals (MULTIVITAMIN WITH MINERALS) tablet Take 1 tablet by mouth daily.  . nitroGLYCERIN (NITROSTAT) 0.4 MG SL tablet Place 1 tablet (0.4 mg total) under the tongue every 5 (five) minutes as needed for chest pain (up to 3 doses).  . polyethylene glycol (MIRALAX) packet Take 17 g by mouth daily as needed.  . [DISCONTINUED] atorvastatin (LIPITOR) 40 MG tablet Take 1 tablet (40 mg total) by mouth daily at 6 PM. Please schedule follow up appt for for further refills. 2nd attempt [phone]  . [DISCONTINUED] isosorbide mononitrate (IMDUR) 60 MG 24 hr tablet Take 1 tablet (60 mg total) by mouth daily.     Allergies:   Patient has no known allergies.   Social History   Socioeconomic History  . Marital status: Widowed    Spouse name: Not on file  . Number of children: Not on file  . Years of education: Not on file  . Highest education level: Not on file  Occupational  History  . Occupation: retired  Tobacco Use  . Smoking status: Former Smoker    Types: Cigarettes  . Smokeless tobacco: Never Used  . Tobacco comment: "haven't smoked since I was 15" tried it once  Vaping Use  . Vaping Use: Never used  Substance and Sexual Activity  . Alcohol use: Yes    Alcohol/week: 0.0 standard drinks    Comment: 02/09/2015 "might have a sip of wne a dozen times/yr"  . Drug use: No  . Sexual activity: Not Currently  Other Topics Concern  . Not on file  Social History Narrative  . Not on file   Social Determinants of Health   Financial Resource Strain:   . Difficulty of Paying Living Expenses:   Food Insecurity:   . Worried About Charity fundraiser in the Last Year:   .  Ran Out of Food in the Last Year:   Transportation Needs:   . Film/video editor (Medical):   [name] Lack of Transportation (Non-Medical):   Physical Activity:   . Days of Exercise per Week:   . Minutes of Exercise per Session:   Stress:   . Feeling of Stress :   Social Connections:   . Frequency of Communication with Friends and Family:   . Frequency of Social Gatherings with Friends and Family:   . Attends Religious Services:   . Active Member of Clubs or Organizations:   . Attends Archivist Meetings:   [name] Marital Status:      Family History:  The patient's family history includes CAD in his father; COPD in his brother; Diabetes in his father, mother, sister, and sister; Heart attack in his father.   ROS:   Please see the history of present illness.    ROS All other systems reviewed and are negative.   PHYSICAL EXAM:   VS:  BP 140/62   Pulse 72   Ht 5\' 5"  (1.651 m)   Wt 178 lb (80.7 kg)   SpO2 96%   BMI 29.62 kg/m   Physical Exam  GEN: Well nourished, well developed, in no acute distress  Neck: no JVD, carotid bruits, or masses Cardiac:RRR; 2/6 systolic murmur LSB Respiratory:  clear to auscultation bilaterally, normal work of breathing GI: soft, nontender,  nondistended, + BS Ext: without cyanosis, clubbing, or edema, Good distal pulses bilaterally Neuro:  Alert and Oriented x 3 Psych: euthymic mood, full affect  Wt Readings from Last 3 Encounters:  11/25/19 178 lb (80.7 kg)  10/21/19 183 lb 1.9 oz (83.1 kg)  06/17/18 185 lb 1.9 oz (84 kg)      Studies/Labs Reviewed:   EKG:  EKG is not ordered today.    Recent Labs: 10/27/2019: ALT 17; BUN 14; Creatinine, Ser 1.01; Hemoglobin 13.9; NT-Pro BNP 171; Platelets 186; Potassium 4.7; Sodium 137; TSH 3.770   Lipid Panel    Component Value Date/Time   CHOL 128 10/27/2019 0919   TRIG 65 10/27/2019 0919   HDL 55 10/27/2019 0919   CHOLHDL 2.3 10/27/2019 0919   CHOLHDL 2.4 03/17/2016 0847   VLDL 14 03/17/2016 0847   LDLCALC 59 10/27/2019 0919    Additional studies/ records that were reviewed today include:    Echo 11/13/19  IMPRESSIONS     1. Left ventricular ejection fraction, by estimation, is 60 to 65%. The  left ventricle has normal function. The left ventricle has no regional  wall motion abnormalities. Left ventricular diastolic parameters are  consistent with Grade I diastolic  dysfunction (impaired relaxation).   2. Right ventricular systolic function is normal. The right ventricular  size is normal.   3. Left atrial size was mildly dilated.   4. The mitral valve is normal in structure. No evidence of mitral valve  regurgitation. No evidence of mitral stenosis.   5. The aortic valve is tricuspid. Aortic valve regurgitation is trivial.  Mild aortic valve stenosis.   6. The inferior vena cava is normal in size with greater than 50%  respiratory variability, suggesting right atrial pressure of 3 mmHg.   FINDINGS   Left Ventricle: Left ventricular ejection fraction, by estimation, is 60  to 65%. The left ventricle has normal function. The left ventricle has no  regional wall motion abnormalities. The left ventricular internal cavity  size was normal in size. There is   no  left ventricular  hypertrophy. Left ventricular diastolic parameters  are consistent with Grade I diastolic dysfunction (impaired relaxation).   Right Ventricle: The right ventricular size is normal. No increase in  right ventricular wall thickness. Right ventricular systolic function is  normal.   Left Atrium: Left atrial size was mildly dilated.   Right Atrium: Right atrial size was normal in size.   Pericardium: There is no evidence of pericardial effusion.   Mitral Valve: The mitral valve is normal in structure. There is mild  thickening of the mitral valve leaflet(s). There is mild calcification of  the mitral valve leaflet(s). Normal mobility of the mitral valve leaflets.  Moderate mitral annular  calcification. No evidence of mitral valve regurgitation. No evidence of  mitral valve stenosis.   Tricuspid Valve: The tricuspid valve is normal in structure. Tricuspid  valve regurgitation is trivial. No evidence of tricuspid stenosis.   Aortic Valve: The aortic valve is tricuspid. . There is moderate  thickening and moderate calcification of the aortic valve. Aortic valve  regurgitation is trivial. Aortic regurgitation PHT measures 456 msec. Mild  aortic stenosis is present. There is  moderate thickening of the aortic valve. There is moderate calcification  of the aortic valve. Aortic valve mean gradient measures 9.0 mmHg. Aortic  valve peak gradient measures 16.8 mmHg. Aortic valve area, by VTI measures  1.63 cm.   Pulmonic Valve: The pulmonic valve was normal in structure. Pulmonic valve  regurgitation is not visualized. No evidence of pulmonic stenosis.   Aorta: The aortic root is normal in size and structure.   Venous: The inferior vena cava is normal in size with greater than 50%  respiratory variability, suggesting right atrial pressure of 3 mmHg.   IAS/Shunts: No atrial level shunt detected by color flow Doppler.       ASSESSMENT:    1. DOE (dyspnea on  exertion)   2. Coronary artery disease involving native coronary artery of native heart without angina pectoris   3. RBBB   4. Aortic stenosis, mild   5. History of GI bleed   6. Hyperlipidemia, unspecified hyperlipidemia type      PLAN:  In order of problems listed above:  DOE ? Anginal equivalent. Imdur increased to 60 mg daily. Echo normal LVEF, grade 1 DD and mild AS. No difference with increased Imdur so will reduce to 30 mg daily. F/u in 6 months    ASCAD -cath 12/2014 showing 95% proximal RCA and diffusely calcified proximal to distal LAD up to 70% status post rotational atherectomy with DES to the proximal RCA on 02/09/2015.   -Repeat cath done for exertional dyspnea and chest pain 01/04/2017 showed 70-80% proximal to mid LAD stenosis with mild progression, patent left circumflex and patent RCA stent with 50-60% mid to distal RCA.   -Medical therapy was recommended due to the degree of heavy calcification of the LAD which require athero-ablation either with rotational atherectomy or orbital atherectomy followed by a very long stented segment with a crossover of a very large diagonal branch.   -It was felt that PCI should be reserved for unstable angina or acute coronary syndrome only. -continue Toprol XL 25mg  daily, plavix 75mg  daily and statin      RBBB -chronic though intermittent.       Mild AS -repeat 2D echo mild with normal LVEF. Repeat in 1 yr. Reviewed echo with patient and son.    Hx of GI bleed  CBC stable      HLD-LDL 59-continue atorvastatin 40mg  daily  Medication Adjustments/Labs and Tests Ordered: Current medicines are reviewed at length with the patient today.  Concerns regarding medicines are outlined above.  Medication changes, Labs and Tests ordered today are listed in the Patient Instructions below. Patient Instructions  Medication Instructions:  Your physician has recommended you make the following change in your medication:  1. DECREASE IMDUR TO  30 MG DAILY  *If you need a refill on your cardiac medications before your next appointment, please call your pharmacy*   Lab Work: NONE If you have labs (blood work) drawn today and your tests are completely normal, you will receive your results only by: [name] MyChart Message (if you have MyChart) OR . A paper copy in the mail If you have any lab test that is abnormal or we need to change your treatment, we will call you to review the results.   Testing/Procedures: NONE   Follow-Up: At [facility], you and your health needs are our priority.  As part of our continuing mission to provide you with exceptional heart care, we have created designated Provider Care Teams.  These Care Teams include your primary Cardiologist (physician) and Advanced Practice Providers (APPs -  Physician Assistants and Nurse Practitioners) who all work together to provide you with the care you need, when you need it.  We recommend signing up for the patient portal called "MyChart".  Sign up information is provided on this After Visit Summary.  MyChart is used to connect with patients for Virtual Visits (Telemedicine).  Patients are able to view lab/test results, encounter notes, upcoming appointments, etc.  Non-urgent messages can be sent to your provider as well.   To learn more about what you can do with MyChart, go to [email or web address].    Your next appointment:   6 month(s)  The format for your next appointment:   In Person  Provider:   You may see Fransico Him, MD or one of the following Advanced Practice Providers on your designated Care Team:    Melina Copa, PA-C  Ermalinda Barrios, PA-C         Signed, Ermalinda Barrios, PA-C  11/25/2019 8:45 AM    [facility] [place], [place], [place]  [address] Phone: [phone]; Fax: [phone]

## 2019-11-13 ENCOUNTER — Other Ambulatory Visit: Payer: Self-pay

## 2019-11-13 ENCOUNTER — Ambulatory Visit (HOSPITAL_COMMUNITY): Payer: Medicare Other | Attending: Cardiology

## 2019-11-13 DIAGNOSIS — R0600 Dyspnea, unspecified: Secondary | ICD-10-CM

## 2019-11-13 DIAGNOSIS — I350 Nonrheumatic aortic (valve) stenosis: Secondary | ICD-10-CM | POA: Diagnosis not present

## 2019-11-13 DIAGNOSIS — I351 Nonrheumatic aortic (valve) insufficiency: Secondary | ICD-10-CM | POA: Insufficient documentation

## 2019-11-13 DIAGNOSIS — R0609 Other forms of dyspnea: Secondary | ICD-10-CM

## 2019-11-14 ENCOUNTER — Telehealth: Payer: Self-pay | Admitting: *Deleted

## 2019-11-14 DIAGNOSIS — I350 Nonrheumatic aortic (valve) stenosis: Secondary | ICD-10-CM

## 2019-11-14 NOTE — Telephone Encounter (Signed)
-----   Message from Sueanne Margarita, MD sent at 11/13/2019  4:49 PM EDT ----- 2D echo showed normal heart function with mildly stiff heart muscle, mildly enlarged LA and mild aortic stenosis with trivial leakiness of AV.  Stable from 1 year ago.  Repeat echo in 1 year for AS

## 2019-11-14 NOTE — Telephone Encounter (Signed)
I spoke with patient and reviewed echo results with him.  Order placed for echo to be done in one year

## 2019-11-14 NOTE — Telephone Encounter (Signed)
Left message to call office

## 2019-11-25 ENCOUNTER — Encounter (INDEPENDENT_AMBULATORY_CARE_PROVIDER_SITE_OTHER): Payer: Self-pay

## 2019-11-25 ENCOUNTER — Other Ambulatory Visit: Payer: Self-pay

## 2019-11-25 ENCOUNTER — Telehealth: Payer: Self-pay | Admitting: Physician Assistant

## 2019-11-25 ENCOUNTER — Ambulatory Visit (INDEPENDENT_AMBULATORY_CARE_PROVIDER_SITE_OTHER): Payer: Medicare Other | Admitting: Physician Assistant

## 2019-11-25 ENCOUNTER — Encounter: Payer: Self-pay | Admitting: Physician Assistant

## 2019-11-25 VITALS — BP 140/62 | HR 72 | Ht 65.0 in | Wt 178.0 lb

## 2019-11-25 DIAGNOSIS — Z8719 Personal history of other diseases of the digestive system: Secondary | ICD-10-CM | POA: Diagnosis not present

## 2019-11-25 DIAGNOSIS — R0600 Dyspnea, unspecified: Secondary | ICD-10-CM | POA: Diagnosis not present

## 2019-11-25 DIAGNOSIS — R0609 Other forms of dyspnea: Secondary | ICD-10-CM

## 2019-11-25 DIAGNOSIS — E785 Hyperlipidemia, unspecified: Secondary | ICD-10-CM

## 2019-11-25 DIAGNOSIS — I350 Nonrheumatic aortic (valve) stenosis: Secondary | ICD-10-CM | POA: Diagnosis not present

## 2019-11-25 DIAGNOSIS — I4510 Unspecified right bundle-branch block: Secondary | ICD-10-CM

## 2019-11-25 DIAGNOSIS — I2510 Atherosclerotic heart disease of native coronary artery without angina pectoris: Secondary | ICD-10-CM

## 2019-11-25 MED ORDER — ATORVASTATIN CALCIUM 40 MG PO TABS: 40.0000 mg | ORAL_TABLET | Freq: Every day | ORAL | 3 refills | Status: DC

## 2019-11-25 MED ORDER — ISOSORBIDE MONONITRATE ER 30 MG PO TB24
30.0000 mg | ORAL_TABLET | Freq: Every day | ORAL | 3 refills | Status: DC
Start: 2019-11-25 — End: 2020-11-23

## 2019-11-25 NOTE — Telephone Encounter (Signed)
° ° °  Pt c/o medication issue:  1. Name of Medication:   isosorbide mononitrate (IMDUR) 30 MG 24 hr tablet    2. How are you currently taking this medication (dosage and times per day)? Take 1 tablet (30 mg total) by mouth daily.  3. Are you having a reaction (difficulty breathing--STAT)?   4. What is your medication issue? Pt said during his appt with Estella Husk, she told him to decrease the dosage of this drug. However he said he doesn't remember taking this drug at all. He would like to get clarification

## 2019-11-25 NOTE — Telephone Encounter (Signed)
Pt did not recognize the name Imdur on pt instruction sheet but is taking the Isosorbide and understands to cut in half.Pt verbalizes understanding .cy

## 2019-11-25 NOTE — Patient Instructions (Signed)
Medication Instructions:  Your physician has recommended you make the following change in your medication:  1. DECREASE IMDUR TO 30 MG DAILY  *If you need a refill on your cardiac medications before your next appointment, please call your pharmacy*   Lab Work: NONE If you have labs (blood work) drawn today and your tests are completely normal, you will receive your results only by: [name] MyChart Message (if you have MyChart) OR . A paper copy in the mail If you have any lab test that is abnormal or we need to change your treatment, we will call you to review the results.   Testing/Procedures: NONE   Follow-Up: At [facility], you and your health needs are our priority.  As part of our continuing mission to provide you with exceptional heart care, we have created designated Provider Care Teams.  These Care Teams include your primary Cardiologist (physician) and Advanced Practice Providers (APPs -  Physician Assistants and Nurse Practitioners) who all work together to provide you with the care you need, when you need it.  We recommend signing up for the patient portal called "MyChart".  Sign up information is provided on this After Visit Summary.  MyChart is used to connect with patients for Virtual Visits (Telemedicine).  Patients are able to view lab/test results, encounter notes, upcoming appointments, etc.  Non-urgent messages can be sent to your provider as well.   To learn more about what you can do with MyChart, go to [email or web address].    Your next appointment:   6 month(s)  The format for your next appointment:   In Person  Provider:   You may see Fransico Him, MD or one of the following Advanced Practice Providers on your designated Care Team:    Melina Copa, PA-C  Ermalinda Barrios, PA-C

## 2019-12-24 ENCOUNTER — Other Ambulatory Visit: Payer: Self-pay | Admitting: Cardiology

## 2019-12-24 DIAGNOSIS — R0602 Shortness of breath: Secondary | ICD-10-CM

## 2020-01-06 DIAGNOSIS — Z794 Long term (current) use of insulin: Secondary | ICD-10-CM | POA: Diagnosis not present

## 2020-01-06 DIAGNOSIS — I10 Essential (primary) hypertension: Secondary | ICD-10-CM | POA: Diagnosis not present

## 2020-01-06 DIAGNOSIS — E1165 Type 2 diabetes mellitus with hyperglycemia: Secondary | ICD-10-CM | POA: Diagnosis not present

## 2020-01-06 DIAGNOSIS — E1159 Type 2 diabetes mellitus with other circulatory complications: Secondary | ICD-10-CM | POA: Diagnosis not present

## 2020-05-24 DIAGNOSIS — Z23 Encounter for immunization: Secondary | ICD-10-CM | POA: Diagnosis not present

## 2020-05-25 ENCOUNTER — Encounter: Payer: Self-pay | Admitting: Cardiology

## 2020-05-25 ENCOUNTER — Other Ambulatory Visit: Payer: Self-pay

## 2020-05-25 ENCOUNTER — Ambulatory Visit (INDEPENDENT_AMBULATORY_CARE_PROVIDER_SITE_OTHER): Payer: Medicare Other | Admitting: Cardiology

## 2020-05-25 VITALS — BP 126/64 | HR 71 | Ht 65.0 in | Wt 174.0 lb

## 2020-05-25 DIAGNOSIS — I77810 Thoracic aortic ectasia: Secondary | ICD-10-CM

## 2020-05-25 DIAGNOSIS — I350 Nonrheumatic aortic (valve) stenosis: Secondary | ICD-10-CM

## 2020-05-25 DIAGNOSIS — I2510 Atherosclerotic heart disease of native coronary artery without angina pectoris: Secondary | ICD-10-CM

## 2020-05-25 DIAGNOSIS — I4510 Unspecified right bundle-branch block: Secondary | ICD-10-CM

## 2020-05-25 DIAGNOSIS — E785 Hyperlipidemia, unspecified: Secondary | ICD-10-CM

## 2020-05-25 MED ORDER — ATORVASTATIN CALCIUM 40 MG PO TABS: 20.0000 mg | ORAL_TABLET | Freq: Every day | ORAL | 3 refills | Status: DC

## 2020-05-25 NOTE — Patient Instructions (Signed)

## 2020-05-25 NOTE — Progress Notes (Signed)
Cardiology Office Note:    Date:  05/25/2020   ID:  Seth Abbott, DOB [birth year], MRN [id number]  PCP:  Seth Rend, MD  Cardiologist:  Seth Him, MD    Referring MD: Seth Rend, MD   Chief Complaint  Patient presents with  . Coronary Artery Disease  . Hyperlipidemia  . Aortic Stenosis    History of Present Illness:    Seth Abbott is a 86 y.o. male with a hx of Onset cath 12/2014 showing 95% proximal RCA and diffusely calcified proximal to distal LAD up to 70% status post rotational atherectomy with DES to the proximal RCA on 02/09/2015. Repeat cath done for exertional dyspnea and chest pain 01/04/2017 showed 70-80% proximal to mid LAD stenosis with mild progression, patent left circumflex and patent RCA stent with 50-60% mid to distal RCA. Medical therapy was recommended due to the degree of heavy calcification of the LAD which require athero-ablation either with rotational atherectomy or orbital atherectomy followed by a very long stented segment with a crossover of a very large diagonal branch. It was felt that PCI should be reserved for unstable angina or acute coronary syndrome only.He also has a history of dilated aortic root at 41 mm by echo in July 2018, GERD, hypertension,type 2 diabetes mellitus and hyperlipidemia.  Last echo 5/20212 showed mild aotic stenosis.Did have acute anemia.     He is here today for followup and is doing well. He has chornic DOE which is very stable.   He denies any chest pain or pressure, PND, orthopnea, LE edema, dizziness, palpitations or syncope. He is compliant with his meds and is tolerating meds with no SE.    Past Medical History:  Diagnosis Date  . Aortic insufficiency    trivial AI by echo 10/2019  . Aortic stenosis, mild    by echo 02/2018  . Arthritis    "mostly in my hands" (02/09/2015)  . CAD (coronary artery disease)    a. 12/2014 cath showed 95% prox RCA and diffuse calcified prox to distal LAD disease up to 70%  at multiple spots. S/p rotational atherectomy/DES to prox RCA 02/09/15. Repeat cath 01/04/2017 showed 70-80% prox to mid LAD stenosis with mild progression since 2016, patent LCx, patent RCA stent and 50-60% mid to distal RCA with normal LVF and normal LVEDP.  Medical management was recommended unless he ha  . Dilated aortic root ([place])    22mm by echo 12/2016  . GERD (gastroesophageal reflux disease)   . HTN (hypertension)   . Hyperlipidemia   . Prostatitis   . Shingles   . TIA (transient ischemic attack) early 1990's; 09/2006   Seth Abbott 10/26/2010  . Type II diabetes mellitus ([place])     Past Surgical History:  Procedure Laterality Date  . BIOPSY  04/25/2018   Procedure: BIOPSY;  Surgeon: Seth Essex, MD;  Location: [place];  Service: Endoscopy;;  . CARDIAC CATHETERIZATION N/A 12/11/2014   Procedure: Left Heart Cath and Coronary Angiography;  Surgeon: Seth Crome, MD;  Location: [place] [facility];  Service: Cardiovascular;  Laterality: N/A;  . CARDIAC CATHETERIZATION N/A 02/09/2015   Procedure: Coronary Stent Intervention-Rotoblator, temp pacer;  Surgeon: Seth Crome, MD;  Location: [place] [facility];  Service: Cardiovascular;  Laterality: N/A;  . CATARACT EXTRACTION W/ INTRAOCULAR LENS  IMPLANT, BILATERAL Bilateral   . ESOPHAGOGASTRODUODENOSCOPY (EGD) WITH PROPOFOL N/A 04/25/2018   Procedure: ESOPHAGOGASTRODUODENOSCOPY (EGD) WITH PROPOFOL;  Surgeon: Seth Essex, MD;  Location: [place];  Service: Endoscopy;  Laterality: N/A;  . EXCISIONAL [place]  . EYE SURGERY    . LEFT HEART CATH AND CORONARY ANGIOGRAPHY N/A 01/04/2017   Procedure: Left Heart Cath and Coronary Angiography;  Surgeon: Seth Crome, MD;  Location: [place] [facility];  Service: Cardiovascular;  Laterality: N/A;  . MEMBRANE PEEL Left 01/11/2015   eye    Current Medications: Current Meds  Medication Sig  . ACCU-CHEK AVIVA PLUS test strip USE TO CHECK BLOOD SUGAR 3 TIMES DAILY  . clopidogrel (PLAVIX)  75 MG tablet Take 1 tablet (75 mg total) by mouth daily. Please keep scheduled appt for future refills. Thanks  . ferrous sulfate 325 (65 FE) MG EC tablet Take 325 mg by mouth 3 (three) times daily with meals.  [name] FLUoxetine (PROZAC) 20 MG tablet TAKE 1 TABLET BY MOUTH EVERY DAY  . insulin lispro (HUMALOG) 100 UNIT/ML cartridge Inject 10-15 Units into the skin 2 (two) times daily. Pt ranges his insulin with his blood sugar readings  . insulin NPH Human (HUMULIN N,NOVOLIN N) 100 UNIT/ML injection Inject 25 Units into the skin 2 (two) times daily before a meal.   . metoprolol succinate (TOPROL-XL) 25 MG 24 hr tablet TAKE 1 TABLET DAILY. IMMEDIATELY OR FOLLOWING A MEAL. PLEASE KEEP APPT IN MAY WITH DR. Radford Abbott FOR FUTURE REFILLS.  . Multiple Vitamins-Minerals (MULTIVITAMIN WITH MINERALS) tablet Take 1 tablet by mouth daily.  . nitroGLYCERIN (NITROSTAT) 0.4 MG SL tablet Place 1 tablet (0.4 mg total) under the tongue every 5 (five) minutes as needed for chest pain (up to 3 doses).  . polyethylene glycol (MIRALAX) packet Take 17 g by mouth daily as needed.  . [DISCONTINUED] atorvastatin (LIPITOR) 40 MG tablet Take 1 tablet (40 mg total) by mouth daily at 6 PM.  . [DISCONTINUED] atorvastatin (LIPITOR) 40 MG tablet Take 20 mg by mouth daily.     Allergies:   Patient has no known allergies.   Social History   Socioeconomic History  . Marital status: Widowed    Spouse name: Not on file  . Number of children: Not on file  . Years of education: Not on file  . Highest education level: Not on file  Occupational History  . Occupation: retired  Tobacco Use  . Smoking status: Former Smoker    Types: Cigarettes  . Smokeless tobacco: Never Used  . Tobacco comment: "haven't smoked since I was 15" tried it once  Vaping Use  . Vaping Use: Never used  Substance and Sexual Activity  . Alcohol use: Yes    Alcohol/week: 0.0 standard drinks    Comment: 02/09/2015 "might have a sip of wne a dozen times/yr"  .  Drug use: No  . Sexual activity: Not Currently  Other Topics Concern  . Not on file  Social History Narrative  . Not on file   Social Determinants of Health   Financial Resource Strain: Not on file  Food Insecurity: Not on file  Transportation Needs: Not on file  Physical Activity: Not on file  Stress: Not on file  Social Connections: Not on file     Family History: The patient's family history includes CAD in his father; COPD in his brother; Diabetes in his father, mother, sister, and sister; Heart attack in his father.  ROS:   Please see the history of present illness.    ROS  All other systems reviewed and negative.   EKGs/Labs/Other Studies Reviewed:    The following studies were reviewed today: Outside labs from  PCP  EKG:  EKG is not ordered today.   Recent Labs: 10/27/2019: ALT 17; BUN 14; Creatinine, Ser 1.01; Hemoglobin 13.9; NT-Pro BNP 171; Platelets 186; Potassium 4.7; Sodium 137; TSH 3.770   Recent Lipid Panel    Component Value Date/Time   CHOL 128 10/27/2019 0919   TRIG 65 10/27/2019 0919   HDL 55 10/27/2019 0919   CHOLHDL 2.3 10/27/2019 0919   CHOLHDL 2.4 03/17/2016 0847   VLDL 14 03/17/2016 0847   LDLCALC 59 10/27/2019 0919    Physical Exam:    VS:  BP 126/64   Pulse 71   Ht 5\' 5"  (1.651 m)   Wt 174 lb (78.9 kg)   SpO2 98%   BMI 28.96 kg/m     Wt Readings from Last 3 Encounters:  05/25/20 174 lb (78.9 kg)  11/25/19 178 lb (80.7 kg)  10/21/19 183 lb 1.9 oz (83.1 kg)     GEN: Well nourished, well developed in no acute distress HEENT: Normal NECK: No JVD; No carotid bruits LYMPHATICS: No lymphadenopathy CARDIAC:RRR, no  rubs, gallops.  2/6 SM at RUSB RESPIRATORY:  Clear to auscultation without rales, wheezing or rhonchi  ABDOMEN: Soft, non-tender, non-distended MUSCULOSKELETAL:  No edema; No deformity  SKIN: Warm and dry NEUROLOGIC:  Alert and oriented x 3 PSYCHIATRIC:  Normal affect    ASSESSMENT:    1. Coronary artery disease  involving native coronary artery of native heart without angina pectoris   2. RBBB   3. Dilated aortic root ([place])   4. Aortic stenosis, mild   5. Hyperlipidemia LDL goal <70    PLAN:    In order of problems listed above:  1.  ASCAD -cath 12/2014 showing 95% proximal RCA and diffusely calcified proximal to distal LAD up to 70% status post rotational atherectomy with DES to the proximal RCA on 02/09/2015.  -Repeat cath done for exertional dyspnea and chest pain 01/04/2017 showed 70-80% proximal to mid LAD stenosis with mild progression, patent left circumflex and patent RCA stent with 50-60% mid to distal RCA.  -Medical therapy was recommended due to the degree of heavy calcification of the LAD which require athero-ablation either with rotational atherectomy or orbital atherectomy followed by a very long stented segment with a crossover of a very large diagonal branch.  -It was felt that PCI should be reserved for unstable angina or acute coronary syndrome only. -he has not had any anginal symptoms and is doing well -continue Toprol XL 25mg  daily, plavix 75mg  daily, long acting nitrate and statin  2.  RBBB  -chronic though intermittent.    3.  Dilated aortic root -on echo 10/2019 it was normal in size.  4.  Mild AS  -echo 10/2019 with mild AS and meang AVG 63mmHg  5.  HLD -LDL goal < 70 -LDL was 59 in May 2021 -continue atorvastatin 40mg  daily  Followup with me in 6 months  Medication Adjustments/Labs and Tests Ordered: Current medicines are reviewed at length with the patient today.  Concerns regarding medicines are outlined above.  No orders of the defined types were placed in this encounter.  Meds ordered this encounter  Medications  . atorvastatin (LIPITOR) 40 MG tablet    Sig: Take 0.5 tablets (20 mg total) by mouth daily.    Dispense:  45 tablet    Refill:  3    Signed, Seth Him, MD  05/25/2020 8:20 AM    [facility]

## 2020-05-26 DIAGNOSIS — Z23 Encounter for immunization: Secondary | ICD-10-CM | POA: Diagnosis not present

## 2020-10-18 ENCOUNTER — Other Ambulatory Visit: Payer: Self-pay | Admitting: Cardiology

## 2020-11-12 ENCOUNTER — Ambulatory Visit (HOSPITAL_COMMUNITY): Payer: Medicare Other | Attending: Cardiology

## 2020-11-12 ENCOUNTER — Encounter: Payer: Self-pay | Admitting: Cardiology

## 2020-11-12 ENCOUNTER — Other Ambulatory Visit: Payer: Self-pay

## 2020-11-12 DIAGNOSIS — I350 Nonrheumatic aortic (valve) stenosis: Secondary | ICD-10-CM | POA: Diagnosis not present

## 2020-11-12 LAB — ECHOCARDIOGRAM COMPLETE
AR max vel: 1.48 cm2
AV Area VTI: 1.4 cm2
AV Area mean vel: 1.34 cm2
AV Mean grad: 8.7 mmHg
AV Peak grad: 16.5 mmHg
Ao pk vel: 2.03 m/s
Area-P 1/2: 2.46 cm2
P 1/2 time: 485 msec
S' Lateral: 3 cm

## 2020-11-23 ENCOUNTER — Ambulatory Visit: Payer: Medicare Other | Admitting: Cardiology

## 2020-11-23 ENCOUNTER — Other Ambulatory Visit: Payer: Self-pay

## 2020-11-23 ENCOUNTER — Encounter: Payer: Self-pay | Admitting: Cardiology

## 2020-11-23 VITALS — BP 134/58 | HR 77 | Ht 66.0 in | Wt 171.6 lb

## 2020-11-23 DIAGNOSIS — I77810 Thoracic aortic ectasia: Secondary | ICD-10-CM

## 2020-11-23 DIAGNOSIS — I4510 Unspecified right bundle-branch block: Secondary | ICD-10-CM | POA: Diagnosis not present

## 2020-11-23 DIAGNOSIS — I2510 Atherosclerotic heart disease of native coronary artery without angina pectoris: Secondary | ICD-10-CM

## 2020-11-23 DIAGNOSIS — I350 Nonrheumatic aortic (valve) stenosis: Secondary | ICD-10-CM

## 2020-11-23 DIAGNOSIS — E785 Hyperlipidemia, unspecified: Secondary | ICD-10-CM

## 2020-11-23 LAB — LIPID PANEL
Chol/HDL Ratio: 2.5 ratio (ref 0.0–5.0)
Cholesterol, Total: 117 mg/dL (ref 100–199)
HDL: 47 mg/dL (ref >39)
LDL Chol Calc (NIH): 54 mg/dL (ref 0–99)
Triglycerides: 77 mg/dL (ref 0–149)
VLDL Cholesterol Cal: 16 mg/dL (ref 5–40)

## 2020-11-23 LAB — ALT: ALT: 14 IU/L (ref 0–44)

## 2020-11-23 MED ORDER — METOPROLOL SUCCINATE ER 25 MG PO TB24
ORAL_TABLET | ORAL | 3 refills | Status: AC
Start: 2020-11-23 — End: ?

## 2020-11-23 MED ORDER — ATORVASTATIN CALCIUM 40 MG PO TABS: 20.0000 mg | ORAL_TABLET | Freq: Every day | ORAL | 3 refills | Status: DC

## 2020-11-23 MED ORDER — ISOSORBIDE MONONITRATE ER 30 MG PO TB24: 30.0000 mg | ORAL_TABLET | Freq: Every day | ORAL | 3 refills | Status: AC

## 2020-11-23 MED ORDER — CLOPIDOGREL BISULFATE 75 MG PO TABS: 75.0000 mg | ORAL_TABLET | Freq: Every day | ORAL | 1 refills | Status: DC

## 2020-11-23 NOTE — Patient Instructions (Signed)
Medication Instructions:  Your physician recommends that you continue on your current medications as directed. Please refer to the Current Medication list given to you today.  *If you need a refill on your cardiac medications before your next appointment, please call your pharmacy*  Lab Work: TODAY: Fasting lipids and ALT If you have labs (blood work) drawn today and your tests are completely normal, you will receive your results only by: [place] (if you have MyChart) OR A paper copy in the mail If you have any lab test that is abnormal or we need to change your treatment, we will call you to review the results.  Follow-Up: At [facility], you and your health needs are our priority.  As part of our continuing mission to provide you with exceptional heart care, we have created designated Provider Care Teams.  These Care Teams include your primary Cardiologist (physician) and Advanced Practice Providers (APPs -  Physician Assistants and Nurse Practitioners) who all work together to provide you with the care you need, when you need it.  Your next appointment:   1 year(s)  The format for your next appointment:   In Person  Provider:   You may see Fransico Him, MD or one of the following Advanced Practice Providers on your designated Care Team:   Melina Copa, PA-C Ermalinda Barrios, PA-C

## 2020-11-23 NOTE — Addendum Note (Signed)
Addended by: Antonieta Iba on: 11/23/2020 08:31 AM   Modules accepted: Orders

## 2020-11-23 NOTE — Progress Notes (Signed)
Cardiology Office Note:    Date:  11/23/2020   ID:  Seth Abbott, DOB December 30, 1927, MRN [id number]  PCP:  Seth Rend, MD  Cardiologist:  Seth Him, MD    Referring MD: Seth Rend, MD   Chief Complaint  Patient presents with   Coronary Artery Disease   Hyperlipidemia   Aortic Stenosis     History of Present Illness:    Seth Abbott is a 87 y.o. male with a hx of ASCAD with cath 12/2014 showing 95% proximal RCA and diffusely calcified proximal to distal LAD up to 70% status post rotational atherectomy with DES to the proximal RCA on 02/09/2015.  Repeat cath done for exertional dyspnea and chest pain 01/04/2017 showed 70-80% proximal to mid LAD stenosis with mild progression, patent left circumflex and patent RCA stent with 50-60% mid to distal RCA.  Medical therapy was recommended due to the degree of heavy calcification of the LAD which would require athero-ablation either with rotational atherectomy or orbital atherectomy followed by a very long stented segment with a crossover of a very large diagonal branch.  It was felt that PCI should be  reserved for unstable angina or acute coronary syndrome only. He also has a history of dilated aortic root at 41 mm by echo in July 2018, GERD, hypertension, type 2 diabetes mellitus and hyperlipidemia.  Last echo 10/2019 showed mild aotic stenosis.  Did have acute anemia.     He is here today for followup and is doing well.  He has chronic DOE that he says is stable.  He denies any chest pain or pressure, PND, orthopnea, LE edema, dizziness(except when his BS gets low), palpitations or syncope. He is compliant with his meds and is tolerating meds with no SE.     Past Medical History:  Diagnosis Date   Aortic insufficiency    mild to moderate AR by echo 11/2020   Arthritis    "mostly in my hands" (02/09/2015)   CAD (coronary artery disease)    a. 12/2014 cath showed 95% prox RCA and diffuse calcified prox to distal LAD disease up to 70% at  multiple spots. S/p rotational atherectomy/DES to prox RCA 02/09/15. Repeat cath 01/04/2017 showed 70-80% prox to mid LAD stenosis with mild progression since 2016, patent LCx, patent RCA stent and 50-60% mid to distal RCA with normal LVF and normal LVEDP.  Medical management was recommended unless he ha   Dilated aortic root (Seth Abbott)    61mm by echo 12/2016   GERD (gastroesophageal reflux disease)    HTN (hypertension)    Hyperlipidemia    Prostatitis    Shingles    TIA (transient ischemic attack) early 1990's; 09/2006   Seth Abbott 10/26/2010   Type II diabetes mellitus ([place])     Past Surgical History:  Procedure Laterality Date   BIOPSY  04/25/2018   Procedure: BIOPSY;  Surgeon: Seth Essex, MD;  Location: [place];  Service: Endoscopy;;   CARDIAC CATHETERIZATION N/A 12/11/2014   Procedure: Left Heart Cath and Coronary Angiography;  Surgeon: Seth Crome, MD;  Location: [place] [facility];  Service: Cardiovascular;  Laterality: N/A;   CARDIAC CATHETERIZATION N/A 02/09/2015   Procedure: Coronary Stent Intervention-Rotoblator, temp pacer;  Surgeon: Seth Crome, MD;  Location: [place] [facility];  Service: Cardiovascular;  Laterality: N/A;   CATARACT EXTRACTION W/ INTRAOCULAR LENS  IMPLANT, BILATERAL Bilateral    ESOPHAGOGASTRODUODENOSCOPY (EGD) WITH PROPOFOL N/A 04/25/2018   Procedure: ESOPHAGOGASTRODUODENOSCOPY (EGD) WITH PROPOFOL;  Surgeon: Seth Abbott,  Seth Dilling, MD;  Location: [place];  Service: Endoscopy;  Laterality: N/A;   EXCISIONAL HEMORRHOIDECTOMY  1957   EYE SURGERY     LEFT HEART CATH AND CORONARY ANGIOGRAPHY N/A 01/04/2017   Procedure: Left Heart Cath and Coronary Angiography;  Surgeon: Seth Crome, MD;  Location: [place] [facility];  Service: Cardiovascular;  Laterality: N/A;   MEMBRANE PEEL Left 01/11/2015   eye    Current Medications: Current Meds  Medication Sig   atorvastatin (LIPITOR) 40 MG tablet Take 0.5 tablets (20 mg total) by mouth daily.   clopidogrel (PLAVIX) 75 MG  tablet TAKE 1 TABLET (75 MG TOTAL) BY MOUTH DAILY. PLEASE KEEP SCHEDULED APPT FOR FUTURE REFILLS. THANKS   ferrous sulfate 325 (65 FE) MG EC tablet Take 325 mg by mouth 3 (three) times daily with meals.   FLUoxetine (PROZAC) 20 MG tablet TAKE 1 TABLET BY MOUTH EVERY DAY   insulin lispro (HUMALOG) 100 UNIT/ML cartridge Inject 10-15 Units into the skin 2 (two) times daily. Pt ranges his insulin with his blood sugar readings   insulin NPH Human (HUMULIN N,NOVOLIN N) 100 UNIT/ML injection Inject 25 Units into the skin 2 (two) times daily before a meal.    isosorbide mononitrate (IMDUR) 30 MG 24 hr tablet Take 1 tablet (30 mg total) by mouth daily.   metoprolol succinate (TOPROL-XL) 25 MG 24 hr tablet TAKE 1 TABLET BY MOUTH EVERY DAY   Multiple Vitamins-Minerals (MULTIVITAMIN WITH MINERALS) tablet Take 1 tablet by mouth daily.   nitroGLYCERIN (NITROSTAT) 0.4 MG SL tablet Place 1 tablet (0.4 mg total) under the tongue every 5 (five) minutes as needed for chest pain (up to 3 doses).   polyethylene glycol (MIRALAX) packet Take 17 g by mouth daily as needed.     Allergies:   Sertraline hcl   Social History   Socioeconomic History   Marital status: Widowed    Spouse name: Not on file   Number of children: Not on file   Years of education: Not on file   Highest education level: Not on file  Occupational History   Occupation: retired  Tobacco Use   Smoking status: Former    Pack years: 0.00    Types: Cigarettes   Smokeless tobacco: Never   Tobacco comments:    "haven't smoked since I was 15" tried it once  Vaping Use   Vaping Use: Never used  Substance and Sexual Activity   Alcohol use: Yes    Alcohol/week: 0.0 standard drinks    Comment: 02/09/2015 "might have a sip of wne a dozen times/yr"   Drug use: No   Sexual activity: Not Currently  Other Topics Concern   Not on file  Social History Narrative   Not on file   Social Determinants of Health   Financial Resource Strain: Not on  file  Food Insecurity: Not on file  Transportation Needs: Not on file  Physical Activity: Not on file  Stress: Not on file  Social Connections: Not on file     Family History: The patient's family history includes CAD in his father; COPD in his brother; Diabetes in his father, mother, sister, and sister; Heart attack in his father.  ROS:   Please see the history of present illness.    ROS  All other systems reviewed and negative.   EKGs/Labs/Other Studies Reviewed:    The following studies were reviewed today: Outside labs from PCP  EKG:  EKG is ordered today and showed NSR with RBBB  Recent Labs: No results found for requested labs within last 8760 hours.   Recent Lipid Panel    Component Value Date/Time   CHOL 128 10/27/2019 0919   TRIG 65 10/27/2019 0919   HDL 55 10/27/2019 0919   CHOLHDL 2.3 10/27/2019 0919   CHOLHDL 2.4 03/17/2016 0847   VLDL 14 03/17/2016 0847   LDLCALC 59 10/27/2019 0919    Physical Exam:    VS:  BP (!) 134/58   Pulse 77   Ht 5\' 6"  (1.676 m)   Wt 171 lb 9.6 oz (77.8 kg)   SpO2 96%   BMI 27.70 kg/m     Wt Readings from Last 3 Encounters:  11/23/20 171 lb 9.6 oz (77.8 kg)  05/25/20 174 lb (78.9 kg)  11/25/19 178 lb (80.7 kg)    GEN: Well nourished, well developed in no acute distress HEENT: Normal NECK: No JVD; No carotid bruits LYMPHATICS: No lymphadenopathy CARDIAC:RRR, no murmurs, rubs, gallops RESPIRATORY:  Clear to auscultation without rales, wheezing or rhonchi  ABDOMEN: Soft, non-tender, non-distended MUSCULOSKELETAL:  No edema; No deformity  SKIN: Warm and dry NEUROLOGIC:  Alert and oriented x 3 PSYCHIATRIC:  Normal affect    ASSESSMENT:    1. Coronary artery disease involving native coronary artery of native heart without angina pectoris   2. RBBB   3. Dilated aortic root ([place])   4. Aortic stenosis, mild   5. Hyperlipidemia LDL goal <70    PLAN:    In order of problems listed above:   1.  ASCAD -cath 12/2014  showing 95% proximal RCA and diffusely calcified proximal to distal LAD up to 70% status post rotational atherectomy with DES to the proximal RCA on 02/09/2015.   -Repeat cath done for exertional dyspnea and chest pain 01/04/2017 showed 70-80% proximal to mid LAD stenosis with mild progression, patent left circumflex and patent RCA stent with 50-60% mid to distal RCA.   -Medical therapy was recommended due to the degree of heavy calcification of the LAD which require athero-ablation either with rotational atherectomy or orbital atherectomy followed by a very long stented segment with a crossover of a very large diagonal branch.   -It was felt that PCI should be reserved for unstable angina or acute coronary syndrome only. -he has not had any anginal symptoms since I saw Abbott last -Continue prescription drug management with Toprol XL 25mg  daily, Plavix 75mg  daily, Atorvastatin 40mg  daily and Imdur 30mg  daily continue>>refilled for 1 year  2.  RBBB  -chronic though intermittent.     3.  Dilated aortic root -on echo 10/2019 it was normal in size.   4.  Mild AS  -echo 10/2019 with mild AS and meang AVG 85mmHg   5.  HLD -LDL goal < 70 -check FLP and ALT  -Continue prescription drug management with Atorvastatin 40mg  daily - refilled for 1 year   Followup with me in 1 year  Medication Adjustments/Labs and Tests Ordered: Current medicines are reviewed at length with the patient today.  Concerns regarding medicines are outlined above.  Orders Placed This Encounter  Procedures   EKG 12-Lead    No orders of the defined types were placed in this encounter.   Signed, Seth Him, MD  11/23/2020 8:21 AM    [place]

## 2020-11-23 NOTE — Addendum Note (Signed)
Addended by: Antonieta Iba on: 11/23/2020 08:25 AM   Modules accepted: Orders

## 2021-01-13 ENCOUNTER — Other Ambulatory Visit: Payer: Self-pay | Admitting: Physician Assistant

## 2021-01-24 ENCOUNTER — Other Ambulatory Visit: Payer: Self-pay | Admitting: Cardiology

## 2021-01-24 ENCOUNTER — Other Ambulatory Visit: Payer: Self-pay | Admitting: Physician Assistant

## 2021-01-24 DIAGNOSIS — R0602 Shortness of breath: Secondary | ICD-10-CM

## 2021-07-08 ENCOUNTER — Other Ambulatory Visit: Payer: Self-pay | Admitting: Cardiology

## 2021-11-12 ENCOUNTER — Other Ambulatory Visit: Payer: Self-pay | Admitting: Cardiology

## 2022-01-17 ENCOUNTER — Encounter (HOSPITAL_COMMUNITY): Payer: Self-pay | Admitting: Emergency Medicine

## 2022-01-17 ENCOUNTER — Emergency Department (HOSPITAL_COMMUNITY)
Admission: EM | Admit: 2022-01-17 | Discharge: 2022-01-17 | Discharge status: Against Medical Advice | Payer: Medicare Other | Attending: Emergency Medicine | Admitting: Emergency Medicine

## 2022-01-17 DIAGNOSIS — Z794 Long term (current) use of insulin: Secondary | ICD-10-CM | POA: Insufficient documentation

## 2022-01-17 DIAGNOSIS — E162 Hypoglycemia, unspecified: Secondary | ICD-10-CM | POA: Diagnosis not present

## 2022-01-17 DIAGNOSIS — Z5321 Procedure and treatment not carried out due to patient leaving prior to being seen by health care provider: Secondary | ICD-10-CM | POA: Insufficient documentation

## 2022-01-17 DIAGNOSIS — R55 Syncope and collapse: Secondary | ICD-10-CM | POA: Insufficient documentation

## 2022-01-17 LAB — COMPREHENSIVE METABOLIC PANEL
ALT: 19 U/L (ref 0–44)
AST: 23 U/L (ref 15–41)
Albumin: 3.9 g/dL (ref 3.5–5.0)
Alkaline Phosphatase: 68 U/L (ref 38–126)
Anion gap: 9 (ref 5–15)
BUN: 15 mg/dL (ref 8–23)
CO2: 25 mmol/L (ref 22–32)
Calcium: 9.1 mg/dL (ref 8.9–10.3)
Chloride: 106 mmol/L (ref 98–111)
Creatinine, Ser: 1.18 mg/dL (ref 0.61–1.24)
GFR, Estimated: 58 mL/min — ABNORMAL LOW (ref >60)
Glucose, Bld: 101 mg/dL — ABNORMAL HIGH (ref 70–99)
Potassium: 4.1 mmol/L (ref 3.5–5.1)
Sodium: 140 mmol/L (ref 135–145)
Total Bilirubin: 0.5 mg/dL (ref 0.3–1.2)
Total Protein: 7.3 g/dL (ref 6.5–8.1)

## 2022-01-17 LAB — CBC
HCT: 43.4 % (ref 39.0–52.0)
Hemoglobin: 14.1 g/dL (ref 13.0–17.0)
MCH: 31.1 pg (ref 26.0–34.0)
MCHC: 32.5 g/dL (ref 30.0–36.0)
MCV: 95.8 fL (ref 80.0–100.0)
Platelets: 210 10*3/uL (ref 150–400)
RBC: 4.53 MIL/uL (ref 4.22–5.81)
RDW: 14.3 % (ref 11.5–15.5)
WBC: 5.9 10*3/uL (ref 4.0–10.5)
nRBC: 0 % (ref 0.0–0.2)

## 2022-01-17 LAB — CBG MONITORING, ED: Glucose-Capillary: 89 mg/dL (ref 70–99)

## 2022-01-17 NOTE — ED Provider Triage Note (Signed)
Emergency Medicine Provider Triage Evaluation Note  Seth Abbott , a 88 y.o. male  was evaluated in triage.  Pt complains of low blood sugars earlier today along with multiple syncopal episodes over the last week, most recently this morning.  Patient lives alone and states that he takes a long-acting and a short acting insulin.  He took his insulin this morning, but he cannot remember if he ate afterwards or not.  He does remember passing out.  Denies chest pain, shortness of breath, abdominal pain, nausea, vomiting and diarrhea.  Currently he is feeling well.  He states that he has had a few episodes of syncope throughout the week.  He has difficulty remembering events this morning.  Review of Systems  Positive:  Negative:   Physical Exam  BP (!) 153/72 (BP Location: Right Arm)   Pulse 74   Resp 16   SpO2 100%  Gen:   Awake, no distress   Resp:  Normal effort  MSK:   Moves extremities without difficulty  Other:    Medical Decision Making  Medically screening exam initiated at 2:07 PM.  Appropriate orders placed.  [name] was informed that the remainder of the evaluation will be completed by another provider, this initial triage assessment does not replace that evaluation, and the importance of remaining in the ED until their evaluation is complete.     Tonye Pearson, [place] 01/17/22 1408

## 2022-01-17 NOTE — ED Triage Notes (Signed)
Patient BIB [facility] from home where he lives with family for evaluation of frequent episodes of hypoglycemia. CBG 42 intially with EMS, 25g D10, 20g saline lock in left wrist, repeated CBG with EMS 138. VSS. Patient is alert and in no apparent distress at this time.

## 2022-01-17 NOTE — ED Notes (Signed)
Pt son wanted to take the pt home and make an appointment with his PCP

## 2022-03-15 ENCOUNTER — Other Ambulatory Visit: Payer: Self-pay | Admitting: Cardiology

## 2022-06-13 NOTE — Progress Notes (Deleted)
Cardiology Office Note:    Date:  06/13/2022   ID:  Seth Abbott, DOB Mar 20, 1928, MRN [id number]  PCP:  Seth Rend, Abbott  Cardiologist:  Seth Him, Abbott    Referring Abbott: Seth Rend, Abbott   No chief complaint on file.    History of Present Illness:    Seth Abbott is a 89 y.o. male with a hx of ASCAD with cath 12/2014 showing 95% proximal RCA and diffusely calcified proximal to distal LAD up to 70% status post rotational atherectomy with DES to the proximal RCA on 02/09/2015.  Repeat cath done for exertional dyspnea and chest pain 01/04/2017 showed 70-80% proximal to mid LAD stenosis with mild progression, patent left circumflex and patent RCA stent with 50-60% mid to distal RCA.  Medical therapy was recommended due to the degree of heavy calcification of the LAD which would require athero-ablation either with rotational atherectomy or orbital atherectomy followed by a very long stented segment with a crossover of a very large diagonal branch.  It was felt that PCI should be  reserved for unstable angina or acute coronary syndrome only. He also has a history of dilated aortic root at 41 mm by echo in July 2018, GERD, hypertension, type 2 diabetes mellitus and hyperlipidemia.  Last echo 10/2019 showed mild aotic stenosis.  Did have acute anemia.     He is here today for followup and is doing well.  He denies any chest pain or pressure, SOB, DOE, PND, orthopnea, LE edema, dizziness, palpitations or syncope. He is compliant with his meds and is tolerating meds with no SE.     Past Medical History:  Diagnosis Date   Aortic insufficiency    mild to moderate AR by echo 11/2020   Arthritis    "mostly in my hands" (02/09/2015)   CAD (coronary artery disease)    a. 12/2014 cath showed 95% prox RCA and diffuse calcified prox to distal LAD disease up to 70% at multiple spots. S/p rotational atherectomy/DES to prox RCA 02/09/15. Repeat cath 01/04/2017 showed 70-80% prox to mid LAD stenosis with mild  progression since 2016, patent LCx, patent RCA stent and 50-60% mid to distal RCA with normal LVF and normal LVEDP.  Medical management was recommended unless he ha   Dilated aortic root (Seth Abbott)    49m by echo 12/2016   GERD (gastroesophageal reflux disease)    HTN (hypertension)    Hyperlipidemia    Prostatitis    Shingles    TIA (transient ischemic attack) early 1990's; 09/2006   /Seth Abbott   Type II diabetes mellitus ([facility]     Past Surgical History:  Procedure Laterality Date   BIOPSY  04/25/2018   Procedure: BIOPSY;  Surgeon: Seth Abbott;  Location: [facility]  Service: Endoscopy;;   CARDIAC CATHETERIZATION N/A 12/11/2014   Procedure: Left Heart Cath and Coronary Angiography;  Surgeon: Seth Abbott;  Location: [facility];  Service: Cardiovascular;  Laterality: N/A;   CARDIAC CATHETERIZATION N/A 02/09/2015   Procedure: Coronary Stent Intervention-Rotoblator, temp pacer;  Surgeon: Seth Abbott;  Location: [facility];  Service: Cardiovascular;  Laterality: N/A;   CATARACT EXTRACTION W/ INTRAOCULAR LENS  IMPLANT, BILATERAL Bilateral    ESOPHAGOGASTRODUODENOSCOPY (EGD) WITH PROPOFOL N/A 04/25/2018   Procedure: ESOPHAGOGASTRODUODENOSCOPY (EGD) WITH PROPOFOL;  Surgeon: Seth Abbott;  Location: [facility]  Service: Endoscopy;  Laterality: N/A;   EXCISIONAL HEMORRHOIDECTOMY  1957   EYE SURGERY     LEFT  HEART CATH AND CORONARY ANGIOGRAPHY N/A 01/04/2017   Procedure: Left Heart Cath and Coronary Angiography;  Surgeon: Seth Abbott;  Location: [facility];  Service: Cardiovascular;  Laterality: N/A;   MEMBRANE PEEL Left 01/11/2015   eye    Current Medications: No outpatient medications have been marked as taking for the 06/16/22 encounter (Appointment) with Seth Abbott.     Allergies:   Sertraline hcl   Social History   Socioeconomic History   Marital status: Widowed    Spouse name: Not on file   Number of children: Not on file    Years of education: Not on file   Highest education level: Not on file  Occupational History   Occupation: retired  Tobacco Use   Smoking status: Former    Types: Cigarettes   Smokeless tobacco: Never   Tobacco comments:    "haven't smoked since I was 15" tried it once  Vaping Use   Vaping Use: Never used  Substance and Sexual Activity   Alcohol use: Yes    Alcohol/week: 0.0 standard drinks of alcohol    Comment: 02/09/2015 "might have a sip of wne a dozen times/yr"   Drug use: No   Sexual activity: Not Currently  Other Topics Concern   Not on file  Social History Narrative   Not on file   Social Determinants of Health   Financial Resource Strain: Not on file  Food Insecurity: Not on file  Transportation Needs: Not on file  Physical Activity: Not on file  Stress: Not on file  Social Connections: Not on file     Family History: The patient's family history includes CAD in his father; COPD in his brother; Diabetes in his father, mother, sister, and sister; Heart attack in his father.  ROS:   Please see the history of present illness.    ROS  All other systems reviewed and negative.   EKGs/Labs/Other Studies Reviewed:    The following studies were reviewed today: Outside labs from PCP  EKG:  EKG is ordered today and showed ***  Recent Labs: 01/17/2022: ALT 19; BUN 15; Creatinine, Ser 1.18; Hemoglobin 14.1; Platelets 210; Potassium 4.1; Sodium 140   Recent Lipid Panel    Component Value Date/Time   CHOL 117 11/23/2020 0837   TRIG 77 11/23/2020 0837   HDL 47 11/23/2020 0837   CHOLHDL 2.5 11/23/2020 0837   CHOLHDL 2.4 03/17/2016 0847   VLDL 14 03/17/2016 0847   LDLCALC 54 11/23/2020 0837    Physical Exam:    VS:  There were no vitals taken for this visit.    Wt Readings from Last 3 Encounters:  11/23/20 171 lb 9.6 oz (77.8 kg)  05/25/20 174 lb (78.9 kg)  11/25/19 178 lb (80.7 kg)    GEN: Well nourished, well developed in no acute distress HEENT:  Normal NECK: No JVD; No carotid bruits LYMPHATICS: No lymphadenopathy CARDIAC:RRR, no murmurs, rubs, gallops RESPIRATORY:  Clear to auscultation without rales, wheezing or rhonchi  ABDOMEN: Soft, non-tender, non-distended MUSCULOSKELETAL:  No edema; No deformity  SKIN: Warm and dry NEUROLOGIC:  Alert and oriented x 3 PSYCHIATRIC:  Normal affect  ASSESSMENT:    1. Coronary artery disease involving native coronary artery of native heart without angina pectoris   2. RBBB   3. Dilated aortic root ([place])   4. Nonrheumatic aortic valve insufficiency   5. Hyperlipidemia LDL goal <70    PLAN:    In order of problems listed above:  1.  ASCAD -cath 12/2014 showing 95% proximal RCA and diffusely calcified proximal to distal LAD up to 70% status post rotational atherectomy with DES to the proximal RCA on 02/09/2015.   -Repeat cath done for exertional dyspnea and chest pain 01/04/2017 showed 70-80% proximal to mid LAD stenosis with mild progression, patent left circumflex and patent RCA stent with 50-60% mid to distal RCA.   -Medical therapy was recommended due to the degree of heavy calcification of the LAD which require athero-ablation either with rotational atherectomy or orbital atherectomy followed by a very long stented segment with a crossover of a very large diagonal branch.   -It was felt that PCI should be reserved for unstable angina or acute coronary syndrome only. -he denies any anginal symptoms -continue prescription drug management with Toprol XL '25mg'$  daily, Plavix '75mg'$  daily, Atorvastatin '40mg'$  daily and Imdur '30mg'$  daily with PRN refills  2.  RBBB  -chronic though intermittent.     3.  Dilated aortic root -on echo 10/2019 it was normal in size.   4.  Aortic Insufficiency -echo 10/2019 with mild AS and meang AVG 93mHg -2D echo 2022 with no AS and mild to moderate AI   5.  HLD -LDL goal < 70 -I have personally reviewed and interpreted outside labs performed by patient's PCP which  showed *** -Continue prescription drug management with Atorvastatin '40mg'$  daily with PRN reffils   Medication Adjustments/Labs and Tests Ordered: Current medicines are reviewed at length with the patient today.  Concerns regarding medicines are outlined above.  No orders of the defined types were placed in this encounter.   No orders of the defined types were placed in this encounter.   Signed, TFransico Him Abbott  06/13/2022 2:41 PM    [place]

## 2022-06-16 ENCOUNTER — Ambulatory Visit: Payer: Medicare Other | Attending: Cardiology | Admitting: Cardiology

## 2022-06-16 DIAGNOSIS — I2510 Atherosclerotic heart disease of native coronary artery without angina pectoris: Secondary | ICD-10-CM

## 2022-06-16 DIAGNOSIS — I351 Nonrheumatic aortic (valve) insufficiency: Secondary | ICD-10-CM

## 2022-06-16 DIAGNOSIS — I4510 Unspecified right bundle-branch block: Secondary | ICD-10-CM

## 2022-06-16 DIAGNOSIS — E785 Hyperlipidemia, unspecified: Secondary | ICD-10-CM

## 2022-06-16 DIAGNOSIS — I77810 Thoracic aortic ectasia: Secondary | ICD-10-CM

## 2022-07-13 DEATH — deceased

## 2022-09-15 ENCOUNTER — Ambulatory Visit: Payer: Medicare Other | Admitting: Cardiology
